# Patient Record
Sex: Male | Born: 1940 | Race: White | Hispanic: No | Marital: Married | Smoking: Former smoker
Health system: Southern US, Community
[De-identification: ages and names within clinical notes are randomized; demographics above are authoritative.]

## PROBLEM LIST (undated history)

## (undated) DIAGNOSIS — R51 Headache: Secondary | ICD-10-CM

## (undated) DIAGNOSIS — K219 Gastro-esophageal reflux disease without esophagitis: Secondary | ICD-10-CM

## (undated) DIAGNOSIS — N183 Chronic kidney disease, stage 3 unspecified: Secondary | ICD-10-CM

## (undated) DIAGNOSIS — I639 Cerebral infarction, unspecified: Secondary | ICD-10-CM

## (undated) DIAGNOSIS — Z789 Other specified health status: Secondary | ICD-10-CM

## (undated) DIAGNOSIS — G8929 Other chronic pain: Secondary | ICD-10-CM

## (undated) DIAGNOSIS — I5032 Chronic diastolic (congestive) heart failure: Secondary | ICD-10-CM

## (undated) DIAGNOSIS — I219 Acute myocardial infarction, unspecified: Secondary | ICD-10-CM

## (undated) DIAGNOSIS — E119 Type 2 diabetes mellitus without complications: Secondary | ICD-10-CM

## (undated) DIAGNOSIS — R519 Headache, unspecified: Secondary | ICD-10-CM

## (undated) DIAGNOSIS — I10 Essential (primary) hypertension: Secondary | ICD-10-CM

## (undated) DIAGNOSIS — C44300 Unspecified malignant neoplasm of skin of unspecified part of face: Secondary | ICD-10-CM

## (undated) DIAGNOSIS — M25519 Pain in unspecified shoulder: Secondary | ICD-10-CM

## (undated) DIAGNOSIS — E7800 Pure hypercholesterolemia, unspecified: Secondary | ICD-10-CM

## (undated) DIAGNOSIS — G4733 Obstructive sleep apnea (adult) (pediatric): Secondary | ICD-10-CM

## (undated) DIAGNOSIS — F419 Anxiety disorder, unspecified: Secondary | ICD-10-CM

## (undated) DIAGNOSIS — E039 Hypothyroidism, unspecified: Secondary | ICD-10-CM

## (undated) DIAGNOSIS — I2510 Atherosclerotic heart disease of native coronary artery without angina pectoris: Secondary | ICD-10-CM

## (undated) DIAGNOSIS — F32A Depression, unspecified: Secondary | ICD-10-CM

## (undated) DIAGNOSIS — I77810 Thoracic aortic ectasia: Secondary | ICD-10-CM

## (undated) DIAGNOSIS — J449 Chronic obstructive pulmonary disease, unspecified: Secondary | ICD-10-CM

## (undated) DIAGNOSIS — R410 Disorientation, unspecified: Secondary | ICD-10-CM

## (undated) DIAGNOSIS — F329 Major depressive disorder, single episode, unspecified: Secondary | ICD-10-CM

## (undated) DIAGNOSIS — I442 Atrioventricular block, complete: Secondary | ICD-10-CM

## (undated) DIAGNOSIS — Z950 Presence of cardiac pacemaker: Secondary | ICD-10-CM

## (undated) DIAGNOSIS — R001 Bradycardia, unspecified: Secondary | ICD-10-CM

## (undated) DIAGNOSIS — M1990 Unspecified osteoarthritis, unspecified site: Secondary | ICD-10-CM

## (undated) DIAGNOSIS — I455 Other specified heart block: Secondary | ICD-10-CM

## (undated) HISTORY — DX: Chronic kidney disease, stage 3 (moderate): N18.3

## (undated) HISTORY — DX: Chronic diastolic (congestive) heart failure: I50.32

## (undated) HISTORY — DX: Thoracic aortic ectasia: I77.810

## (undated) HISTORY — PX: EYE SURGERY: SHX253

## (undated) HISTORY — DX: Chronic kidney disease, stage 3 unspecified: N18.30

## (undated) HISTORY — DX: Other specified health status: Z78.9

## (undated) HISTORY — DX: Other specified heart block: I45.5

## (undated) HISTORY — PX: RETINAL DETACHMENT SURGERY: SHX105

## (undated) HISTORY — PX: INSERT / REPLACE / REMOVE PACEMAKER: SUR710

## (undated) HISTORY — DX: Obstructive sleep apnea (adult) (pediatric): G47.33

## (undated) HISTORY — PX: CORONARY ANGIOPLASTY WITH STENT PLACEMENT: SHX49

## (undated) HISTORY — DX: Bradycardia, unspecified: R00.1

---

## 1999-01-12 HISTORY — PX: CORONARY ANGIOPLASTY WITH STENT PLACEMENT: SHX49

## 2010-01-11 HISTORY — PX: CORONARY ANGIOPLASTY: SHX604

## 2011-09-09 DIAGNOSIS — I214 Non-ST elevation (NSTEMI) myocardial infarction: Secondary | ICD-10-CM | POA: Insufficient documentation

## 2014-01-11 HISTORY — PX: CATARACT EXTRACTION W/ INTRAOCULAR LENS  IMPLANT, BILATERAL: SHX1307

## 2015-02-10 DIAGNOSIS — R9389 Abnormal findings on diagnostic imaging of other specified body structures: Secondary | ICD-10-CM | POA: Insufficient documentation

## 2015-02-12 DIAGNOSIS — I442 Atrioventricular block, complete: Secondary | ICD-10-CM

## 2015-02-12 HISTORY — DX: Atrioventricular block, complete: I44.2

## 2015-02-19 DIAGNOSIS — I5030 Unspecified diastolic (congestive) heart failure: Secondary | ICD-10-CM | POA: Insufficient documentation

## 2015-02-19 DIAGNOSIS — M75101 Unspecified rotator cuff tear or rupture of right shoulder, not specified as traumatic: Secondary | ICD-10-CM | POA: Insufficient documentation

## 2015-02-19 DIAGNOSIS — G4733 Obstructive sleep apnea (adult) (pediatric): Secondary | ICD-10-CM | POA: Insufficient documentation

## 2015-03-03 ENCOUNTER — Encounter (HOSPITAL_COMMUNITY): Payer: Self-pay

## 2015-03-03 ENCOUNTER — Emergency Department (HOSPITAL_COMMUNITY): Payer: Medicare Other

## 2015-03-03 ENCOUNTER — Inpatient Hospital Stay (HOSPITAL_COMMUNITY)
Admission: EM | Admit: 2015-03-03 | Discharge: 2015-03-05 | DRG: 242 | Disposition: A | Discharge status: Home / Self Care | Payer: Medicare Other | Attending: Cardiology | Admitting: Cardiology

## 2015-03-03 DIAGNOSIS — Z95818 Presence of other cardiac implants and grafts: Secondary | ICD-10-CM

## 2015-03-03 DIAGNOSIS — I469 Cardiac arrest, cause unspecified: Secondary | ICD-10-CM | POA: Diagnosis present

## 2015-03-03 DIAGNOSIS — R001 Bradycardia, unspecified: Secondary | ICD-10-CM | POA: Diagnosis present

## 2015-03-03 DIAGNOSIS — I10 Essential (primary) hypertension: Secondary | ICD-10-CM | POA: Diagnosis present

## 2015-03-03 DIAGNOSIS — Z955 Presence of coronary angioplasty implant and graft: Secondary | ICD-10-CM

## 2015-03-03 DIAGNOSIS — I2510 Atherosclerotic heart disease of native coronary artery without angina pectoris: Secondary | ICD-10-CM | POA: Diagnosis present

## 2015-03-03 DIAGNOSIS — G4733 Obstructive sleep apnea (adult) (pediatric): Secondary | ICD-10-CM | POA: Diagnosis present

## 2015-03-03 DIAGNOSIS — R059 Cough, unspecified: Secondary | ICD-10-CM

## 2015-03-03 DIAGNOSIS — I442 Atrioventricular block, complete: Principal | ICD-10-CM | POA: Diagnosis present

## 2015-03-03 DIAGNOSIS — J90 Pleural effusion, not elsewhere classified: Secondary | ICD-10-CM

## 2015-03-03 DIAGNOSIS — K7689 Other specified diseases of liver: Secondary | ICD-10-CM | POA: Diagnosis present

## 2015-03-03 DIAGNOSIS — R55 Syncope and collapse: Secondary | ICD-10-CM | POA: Diagnosis not present

## 2015-03-03 DIAGNOSIS — R05 Cough: Secondary | ICD-10-CM

## 2015-03-03 HISTORY — DX: Essential (primary) hypertension: I10

## 2015-03-03 HISTORY — DX: Acute myocardial infarction, unspecified: I21.9

## 2015-03-03 LAB — CBC WITH DIFFERENTIAL/PLATELET
Basophils Absolute: 0 10*3/uL (ref 0.0–0.1)
Basophils Relative: 0 %
Eosinophils Absolute: 0.1 10*3/uL (ref 0.0–0.7)
Eosinophils Relative: 3 %
HEMATOCRIT: 37 % — AB (ref 39.0–52.0)
HEMOGLOBIN: 12.7 g/dL — AB (ref 13.0–17.0)
LYMPHS ABS: 0.8 10*3/uL (ref 0.7–4.0)
LYMPHS PCT: 21 %
MCH: 31.3 pg (ref 26.0–34.0)
MCHC: 34.3 g/dL (ref 30.0–36.0)
MCV: 91.1 fL (ref 78.0–100.0)
MONOS PCT: 8 %
Monocytes Absolute: 0.3 10*3/uL (ref 0.1–1.0)
NEUTROS ABS: 2.5 10*3/uL (ref 1.7–7.7)
NEUTROS PCT: 68 %
Platelets: 146 10*3/uL — ABNORMAL LOW (ref 150–400)
RBC: 4.06 MIL/uL — ABNORMAL LOW (ref 4.22–5.81)
RDW: 14.1 % (ref 11.5–15.5)
WBC: 3.6 10*3/uL — ABNORMAL LOW (ref 4.0–10.5)

## 2015-03-03 LAB — COMPREHENSIVE METABOLIC PANEL
ALK PHOS: 53 U/L (ref 38–126)
ALT: 43 U/L (ref 17–63)
ANION GAP: 11 (ref 5–15)
AST: 48 U/L — ABNORMAL HIGH (ref 15–41)
Albumin: 3.3 g/dL — ABNORMAL LOW (ref 3.5–5.0)
BILIRUBIN TOTAL: 0.5 mg/dL (ref 0.3–1.2)
BUN: 12 mg/dL (ref 6–20)
CALCIUM: 8.7 mg/dL — AB (ref 8.9–10.3)
CO2: 22 mmol/L (ref 22–32)
CREATININE: 1.46 mg/dL — AB (ref 0.61–1.24)
Chloride: 105 mmol/L (ref 101–111)
GFR calc non Af Amer: 45 mL/min — ABNORMAL LOW (ref >60)
GFR, EST AFRICAN AMERICAN: 52 mL/min — AB (ref >60)
GLUCOSE: 232 mg/dL — AB (ref 65–99)
Potassium: 4 mmol/L (ref 3.5–5.1)
Sodium: 138 mmol/L (ref 135–145)
TOTAL PROTEIN: 6.9 g/dL (ref 6.5–8.1)

## 2015-03-03 LAB — MRSA PCR SCREENING: MRSA BY PCR: NEGATIVE

## 2015-03-03 LAB — I-STAT CG4 LACTIC ACID, ED: Lactic Acid, Venous: 2.59 mmol/L (ref 0.5–2.0)

## 2015-03-03 LAB — I-STAT TROPONIN, ED: Troponin i, poc: 0.01 ng/mL (ref 0.00–0.08)

## 2015-03-03 LAB — BRAIN NATRIURETIC PEPTIDE: B NATRIURETIC PEPTIDE 5: 17.6 pg/mL (ref 0.0–100.0)

## 2015-03-03 LAB — CBG MONITORING, ED: Glucose-Capillary: 182 mg/dL — ABNORMAL HIGH (ref 65–99)

## 2015-03-03 MED ORDER — SODIUM CHLORIDE 0.9 % IV SOLN
INTRAVENOUS | Status: DC
  Administered 2015-03-04: 06:00:00 via INTRAVENOUS

## 2015-03-03 MED ORDER — CEFAZOLIN SODIUM-DEXTROSE 2-3 GM-% IV SOLR
2.0000 g | INTRAVENOUS | Status: DC
  Filled 2015-03-03: qty 50

## 2015-03-03 MED ORDER — SODIUM CHLORIDE 0.9% FLUSH
3.0000 mL | Freq: Two times a day (BID) | INTRAVENOUS | Status: DC
  Administered 2015-03-04: 3 mL via INTRAVENOUS

## 2015-03-03 MED ORDER — SODIUM CHLORIDE 0.9 % IV SOLN: 250.0000 mL | INTRAVENOUS | Status: DC | PRN

## 2015-03-03 MED ORDER — VANCOMYCIN HCL 10 G IV SOLR
2000.0000 mg | Freq: Once | INTRAVENOUS | Status: AC
  Administered 2015-03-03: 2000 mg via INTRAVENOUS
  Filled 2015-03-03: qty 2000

## 2015-03-03 MED ORDER — TORSEMIDE 20 MG PO TABS
20.0000 mg | ORAL_TABLET | Freq: Every day | ORAL | Status: DC
  Administered 2015-03-03 – 2015-03-04 (×2): 20 mg via ORAL
  Filled 2015-03-03 (×2): qty 1

## 2015-03-03 MED ORDER — CHLORHEXIDINE GLUCONATE 4 % EX LIQD
60.0000 mL | Freq: Once | CUTANEOUS | Status: AC
  Administered 2015-03-03: 4 via TOPICAL
  Filled 2015-03-03: qty 60

## 2015-03-03 MED ORDER — VANCOMYCIN HCL 10 G IV SOLR
1250.0000 mg | INTRAVENOUS | Status: DC
  Administered 2015-03-04: 1250 mg via INTRAVENOUS
  Filled 2015-03-03: qty 1250

## 2015-03-03 MED ORDER — SODIUM CHLORIDE 0.9% FLUSH: 3.0000 mL | INTRAVENOUS | Status: DC | PRN

## 2015-03-03 MED ORDER — ATROPINE SULFATE 0.1 MG/ML IJ SOLN
INTRAMUSCULAR | Status: AC
  Administered 2015-03-03: 0.5 mg
  Filled 2015-03-03: qty 20

## 2015-03-03 MED ORDER — ACETAMINOPHEN 325 MG PO TABS: 650.0000 mg | ORAL_TABLET | ORAL | Status: DC | PRN

## 2015-03-03 MED ORDER — RAMIPRIL 2.5 MG PO CAPS
2.5000 mg | ORAL_CAPSULE | Freq: Every day | ORAL | Status: DC
  Administered 2015-03-03 – 2015-03-04 (×2): 2.5 mg via ORAL
  Filled 2015-03-03 (×2): qty 1

## 2015-03-03 MED ORDER — SODIUM CHLORIDE 0.9 % IR SOLN
80.0000 mg | Status: AC
  Administered 2015-03-04: 80 mg

## 2015-03-03 MED ORDER — ONDANSETRON HCL 4 MG/2ML IJ SOLN: 4.0000 mg | Freq: Four times a day (QID) | INTRAMUSCULAR | Status: DC | PRN

## 2015-03-03 MED ORDER — DEXTROSE 5 % IV SOLN
2.0000 g | Freq: Once | INTRAVENOUS | Status: AC
  Administered 2015-03-03: 2 g via INTRAVENOUS
  Filled 2015-03-03: qty 2

## 2015-03-03 MED ORDER — NITROGLYCERIN 0.4 MG SL SUBL: 0.4000 mg | SUBLINGUAL_TABLET | SUBLINGUAL | Status: DC | PRN

## 2015-03-03 MED ORDER — HYDROCODONE-HOMATROPINE 5-1.5 MG/5ML PO SYRP
5.0000 mL | ORAL_SOLUTION | Freq: Four times a day (QID) | ORAL | Status: DC | PRN
  Administered 2015-03-03: 5 mL via ORAL
  Filled 2015-03-03: qty 5

## 2015-03-03 MED ORDER — CHLORHEXIDINE GLUCONATE 4 % EX LIQD
60.0000 mL | Freq: Once | CUTANEOUS | Status: AC
  Administered 2015-03-04: 4 via TOPICAL
  Filled 2015-03-03: qty 60

## 2015-03-03 NOTE — ED Notes (Signed)
Cardiology at bedside.

## 2015-03-03 NOTE — H&P (Signed)
H&P    Patient ID: [name] MRN: [id number], DOB/AGE: 1940/08/18 75 y.o.  Admit date: 03/03/2015 Date of Consult: 03/03/2015   Primary Physician: No primary care provider on file. Primary Cardiologist:   Reason for Admission: syncope, witnessed asystole  HPI: [name] is a 75 y.o. male with hx of CAD, remote stents x2 about 20 years ago, reportedly had cath about 4 years ago with what sounds PTCA but not another stent.  This was his last cath.  He has HTN, OSA.was noted at home to have a syncopal event.  His wife was in the room, said he dropped his glasses and she picked them up when she went to hand them to him, he was leaned back in the chair and unresponsive.  She states he did not fall out of the chair, did not slump over or have any shaking type movements until he started to "come to" and seemed to be gasping for air, just was with his head back sitting in the chair, she shook him, called his name, said he was clammy, pale, was unable to wake him.  Finally he woke she called 911, he had taken cough syrup the evening prior for a cough that contained hydrocodone, noting a cough in the last few nights.  He has been more tired then usual the last few weeks, his wife reports that she has always been told he has a slow heart rate.  The patient states he feels tired, but not having any kind of CP, palpitations,  He is unaware of the event here or at home.  He was brought to the ER via EMS< noted to have HR's sipping into the 30's by staff, and then a prolonged asystolic associated with LOC event receiving brief CPR with regain of SR.  He has been somnelent here, EMS reoprted fixed pupils, not observed by ER staff, reporteldy has had a number of surguries on his L eye and not felt secondary to an acute neuro event  He is awake and conversing easily at this time.  He was recently treated at [place] for a pneumonia in January, undergoing an echo and stress test while there 01/23/15:  Lexiscan stress test with no ischemia or infarct, normal LV wall motion, EF 63%, low risk findings 01/24/15: Echo with EF 55-60% Notes mentions HR 40's at times.   Past Medical History  Diagnosis Date  . Hypertension   . CHF (congestive heart failure) ([place])   . Heart attack [facility]) 'many years ago'     Surgical History:  Past Surgical History  Procedure Laterality Date  . Cardiac catheterization       Allergies: Allergies not on file  Social History   Social History  . Marital Status: Married    Spouse Name: N/A  . Number of Children: N/A  . Years of Education: N/A   Occupational History  . Not on file.   Social History Main Topics  . Smoking status: Former Research scientist (life sciences)  . Smokeless tobacco: Not on file  . Alcohol Use: No  . Drug Use: No  . Sexual Activity: Not on file   Other Topics Concern  . Not on file   Social History Narrative  . No narrative on file     Family History  Problem Relation Age of Onset  . Diabetes Sister   . Diabetes Brother      Review of Systems: All other systems reviewed and are otherwise negative except as noted above.  Physical  Exam: Filed Vitals:   03/03/15 1000 03/03/15 1030 03/03/15 1100 03/03/15 1130  BP: 130/77 126/84 127/110 146/92  Pulse: 66 58 55 56  Temp:      TempSrc:      Resp: 18 20 17 15   Height:      Weight:      SpO2: 98% 100% 99% 99%    GEN- The patient is obese appearing, alert and oriented x 3 today, sleeping but arouses.   HEENT: normocephalic, atraumatic; sclera clear, conjunctiva pink; hearing intact; oropharynx clear; neck supple Lungs- Clear to ausculation bilaterally, normal work of breathing.  Right base with rhonchi Heart- Regular rate and rhythm, no murmurs, rubs or gallops  GI- soft, non-tender, non-distended, bowel sounds present Extremities- no clubbing, cyanosis, or edema; DP/PT/radial pulses 2+ bilaterally MS- no significant deformity or atrophy Skin- warm and dry, no rash or lesion Psych-  euthymic mood, full affect Neuro- no gross deficits observed  Labs:   Lab Results  Component Value Date   WBC 3.6* 03/03/2015   HGB 12.7* 03/03/2015   HCT 37.0* 03/03/2015   MCV 91.1 03/03/2015   PLT 146* 03/03/2015     Recent Labs Lab 03/03/15 0816  NA 138  K 4.0  CL 105  CO2 22  BUN 12  CREATININE 1.46*  CALCIUM 8.7*  PROT 6.9  BILITOT 0.5  ALKPHOS 53  ALT 43  AST 48*  GLUCOSE 232*      Radiology/Studies: Dg Chest 2 View 03/03/2015  CLINICAL DATA:  75 year old male with cough EXAM: CHEST  2 VIEW COMPARISON:  None. FINDINGS: Two views of the chest demonstrate a focal area of increased density at the right lung base with silhouetting of the right hemidiaphragm compatible with atelectasis versus pneumonia. A small right pleural effusion may be present. Left lung base subsegmental linear atelectatic changes noted. There is no pneumothorax. The right cardiac border is silhouetted. There is degenerative changes of the spine and left shoulder. No acute fracture. IMPRESSION: Right lung base opacity compatible with atelectasis/ pneumonia. Electronically Signed   By: Anner Crete M.D.   On: 03/03/2015 08:57    EKG:SR, 1st degree AVblock, no ST changes, no historical EKGs available, he had SB   TELEMETRY: Currently SR 60's, he had SB   Assessment and Plan:  1. Syncope with documented complete heart block  Not on any AVN blocking agents EF normal 01/2015 with myoview and no ischemia  Continue pacer pads Admit to stepdown Plan dual chamber pacemaker as schedule allows. NPO after midnight tonight.   2.  ?Pneumonia Ongoing issue Has been treated by PCP as outpatient Will ask IM to evaluate    Signed, Chanetta Marshall, NP 03/03/2015 12:23 PM  EP Attending  Patient seen and examined. Agree with the findings as noted by Chanetta Marshall, NP-C. 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  He is not on any AV or sinus nodal blocking drugs. Exam reveals a RRR, scattered rales/rhonchi on the right, no edema and a non-focal neuro exam. ECG with sinus bradycardia. A/P 1. 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 HTN 3. Remote pneumonia Rec: will observe on tele, and will likely place PPM tomorrow. Evidence of an active infection would prevent this from occuring.   Mikle Bosworth.D.

## 2015-03-03 NOTE — ED Provider Notes (Signed)
Medical screening examination/treatment/procedure(s) were conducted as a shared visit with non-physician practitioner(s) and myself.  I personally evaluated the patient during the encounter.   EKG Interpretation None     Patient here after having syncopal event at home. While in the department he became bradycardic and less responsive. His wife says she's had issues with being sleepy over a month and a half thought to be related to his diuretic use. Heart rate was in the 40s. Is given atropine 0.5 mg IV push. Heart rate improved to 60s. Emergent cardiac consult obtained and will be admitted per cards  Lacretia Leigh, MD 03/03/15 [phone]

## 2015-03-03 NOTE — ED Notes (Addendum)
RN at bedside when pt. Went pulseless for 20 seconds. RN started chest compressions at that time for another 20 seconds. Pt. Pulses returned and regained consciousness during compressions. 2nd IV started, fluids hooked up, and EDP notfied. EDP verbally ordered 0.5mg  atropine. Pt. Stable at this time.

## 2015-03-03 NOTE — Progress Notes (Signed)
Pt had 8 beat run of Vtach while laying in bed was asymptomatic denied SOB and or chest pain, vital signs stable will continue to monitor.

## 2015-03-03 NOTE — Progress Notes (Signed)
Pharmacy Antibiotic Note  Ausitn Hendrix is a 75 y.o. male admitted on 03/03/2015 with pneumonia.  Pharmacy has been consulted for vancomycin dosing.  Plan: - Vancomycin 2gm IV x 1 then 1250mg  IV Q24H - F/u renal fxn, C&S, clinical status and trough at SS - Continue cefepime or other gram negative coverage?  Height: 6' (182.9 cm) Weight: 230 lb (104.327 kg) IBW/kg (Calculated) : 77.6  Temp (24hrs), Avg:97.7 F (36.5 C), Min:97.7 F (36.5 C), Max:97.7 F (36.5 C)   Recent Labs Lab 03/03/15 0816  WBC 3.6*  CREATININE 1.46*    Estimated Creatinine Clearance: 53.8 mL/min (by C-G formula based on Cr of 1.46).    Allergies not on file  Antimicrobials this admission: Vanc 2/20>> Cefepime x 1 2/20  Dose adjustments this admission: N/A  Microbiology results: Pending  Thank you for allowing pharmacy to be a part of this patient's care.  [name], [name] 03/03/2015 9:15 AM

## 2015-03-03 NOTE — ED Provider Notes (Signed)
CSN: [id number]     Arrival date & time 03/03/15  0740 History   First MD Initiated Contact with Patient 03/03/15 [phone]     Chief Complaint  Patient presents with  . Loss of Consciousness     (Consider location/radiation/quality/duration/timing/severity/associated sxs/prior Treatment) HPI Comments: Patient with a history of STEMI, CAD s/p PCI, CHF, and HTN brought in today by EMS due to a syncopal episode that occurred this morning.  Patient's wife reports around 6:30 AM this morning he had a syncopal episode while sitting in his chair.  She estimates that it lasted around 3-5 minutes and then resolved.  He did not fall during this episode or hit his head.  Wife reports that he appeared to be breathing heavy during this episode.  Patient denies any CP or SOB associated with this episode.  Review of the chart shows that he was admitted at [facility] on 01/25/15 for CP.  At that time he had a abnormal Chest CT , which was concerning for Pneumonia.  He had a Myoview Perfusion test done during that admission, which was negative.  It was noted in the chart that his heart rate ranged from 40-92 BPM during that admission.  He has had a cough for the past week and did take Hydrocodone liquid cough syrup last evening around 10:30 PM.  He has not taken any cough medicine since that time.  Wife reports that he has been very tired and sleeping all the time for the past 1.5 months, which she thought was secondary to his diuretic use.  No headache, dizziness, vision changes, focal weakness, numbness, tingling, fever, chills, nausea, or vomiting.    Patient is a 75 y.o. male presenting with syncope. The history is provided by the patient and the spouse.  Loss of Consciousness   No past medical history on file. No past surgical history on file. No family history on file. Social History  Substance Use Topics  . Smoking status: Not on file  . Smokeless tobacco: Not on file  . Alcohol Use: Not on file     Review of Systems  Cardiovascular: Positive for syncope.  All other systems reviewed and are negative.     Allergies  Review of patient's allergies indicates not on file.  Home Medications   Prior to Admission medications   Not on File   BP 133/87 mmHg  Pulse 59  Temp(Src) 97.7 F (36.5 C) (Oral)  Resp 21  Ht 6' (1.829 m)  Wt 104.327 kg  BMI 31.19 kg/m2  SpO2 97% Physical Exam  Constitutional: He is oriented to person, place, and time. He appears well-developed and well-nourished.  HENT:  Head: Normocephalic and atraumatic.  Mouth/Throat: Oropharynx is clear and moist.  Eyes: EOM are normal.  Neck: Normal range of motion. Neck supple.  Cardiovascular: Regular rhythm and normal heart sounds.  Bradycardia present.   Pulmonary/Chest: Effort normal and breath sounds normal.  Musculoskeletal: Normal range of motion.  2+ bilateral pitting edema bilaterally  Neurological: He is alert and oriented to person, place, and time. He has normal strength. No cranial nerve deficit or sensory deficit.  Left pupil non reactive, history of multiple eye surgeries Right pupil reactive  Skin: Skin is warm and dry.  Psychiatric: He has a normal mood and affect.  Nursing note and vitals reviewed.   ED Course  Procedures (including critical care time) Labs Review Labs Reviewed - No data to display  Imaging Review Dg Chest 2 View  03/03/2015  CLINICAL DATA:  75 year old male with cough EXAM: CHEST  2 VIEW COMPARISON:  None. FINDINGS: Two views of the chest demonstrate a focal area of increased density at the right lung base with silhouetting of the right hemidiaphragm compatible with atelectasis versus pneumonia. A small right pleural effusion may be present. Left lung base subsegmental linear atelectatic changes noted. There is no pneumothorax. The right cardiac border is silhouetted. There is degenerative changes of the spine and left shoulder. No acute fracture. IMPRESSION: Right  lung base opacity compatible with atelectasis/ pneumonia. Electronically Signed   By: Anner Crete M.D.   On: 03/03/2015 08:57   I have personally reviewed and evaluated these images and lab results as part of my medical decision-making.   EKG Interpretation   Date/Time:  Monday March 03 2015 09:23:41 EST Ventricular Rate:  69 PR Interval:  217 QRS Duration: 99 QT Interval:  450 QTC Calculation: 482 R Axis:   0 Text Interpretation:  Sinus rhythm Borderline prolonged PR interval  Indeterminate axis Borderline prolonged QT interval No significant change  since last tracing Confirmed by Zenia Resides  MD, ANTHONY ([id number]) on 03/03/2015  11:02:13 AM     CRITICAL CARE Performed by: Hyman Bible   Total critical care time: 30 minutes  Critical care time was exclusive of separately billable procedures and treating other patients.  Critical care was necessary to treat or prevent imminent or life-threatening deterioration.  Critical care was time spent personally by me on the following activities: development of treatment plan with patient and/or surrogate as well as nursing, discussions with consultants, evaluation of patient's response to treatment, examination of patient, obtaining history from patient or surrogate, ordering and performing treatments and interventions, ordering and review of laboratory studies, ordering and review of radiographic studies, pulse oximetry and re-evaluation of patient's condition.  MDM   Final diagnoses:  None  Patient presents today after a syncopal episode that occurred this morning.  No focal neurological deficits.  He denies CP or SOB.  No ischemic changes on EKG, but patient found to be bradycardic.  Troponin negative.  CXR showing possible Pneumonia/Atelectasis.  During ED course patient had an episode of asystole.  RN performed a couple of compressions and patient went back into sinus bradycardia.  He was then given IV Atropine and HR went up into  the 80's.  Cardiology was consulted and admitted the patient.      Hyman Bible, PA-C 03/03/15 2204

## 2015-03-03 NOTE — ED Notes (Signed)
Pt. Coming from home via [facility] after a syncopal episode this morning around 0630. Upon EMS arrival patient was pale and diaphoretic. EMS report pupils were pinpoint and not reactive to light. Pt. C/o being sleepy. Pt. Asleep on arrival to ED. Pt. Wife reports he took cough syrup with hydrocodone last night around 1000 and only took the recommended 64mL dose. Pt. Hx of MI with 2 stents. Pt. Also hospitalized in mid January, but wife cannot recall what for.

## 2015-03-03 NOTE — ED Notes (Signed)
EDP at bedside  

## 2015-03-03 NOTE — ED Notes (Signed)
Delay in antibiotics-no IV poles available. IV poles ordered at this time.

## 2015-03-04 ENCOUNTER — Inpatient Hospital Stay (HOSPITAL_COMMUNITY): Payer: Medicare Other

## 2015-03-04 ENCOUNTER — Encounter (HOSPITAL_COMMUNITY)
Admission: EM | Disposition: A | Discharge status: Home / Self Care | Payer: Self-pay | Source: Home / Self Care | Attending: Cardiology

## 2015-03-04 ENCOUNTER — Encounter (HOSPITAL_COMMUNITY): Payer: Self-pay | Admitting: Radiology

## 2015-03-04 DIAGNOSIS — R001 Bradycardia, unspecified: Secondary | ICD-10-CM | POA: Diagnosis present

## 2015-03-04 DIAGNOSIS — I495 Sick sinus syndrome: Secondary | ICD-10-CM

## 2015-03-04 DIAGNOSIS — I2510 Atherosclerotic heart disease of native coronary artery without angina pectoris: Secondary | ICD-10-CM | POA: Diagnosis present

## 2015-03-04 DIAGNOSIS — K7689 Other specified diseases of liver: Secondary | ICD-10-CM | POA: Diagnosis present

## 2015-03-04 DIAGNOSIS — I469 Cardiac arrest, cause unspecified: Secondary | ICD-10-CM | POA: Diagnosis present

## 2015-03-04 DIAGNOSIS — Z955 Presence of coronary angioplasty implant and graft: Secondary | ICD-10-CM | POA: Diagnosis not present

## 2015-03-04 DIAGNOSIS — I10 Essential (primary) hypertension: Secondary | ICD-10-CM | POA: Diagnosis present

## 2015-03-04 DIAGNOSIS — I455 Other specified heart block: Secondary | ICD-10-CM

## 2015-03-04 DIAGNOSIS — G4733 Obstructive sleep apnea (adult) (pediatric): Secondary | ICD-10-CM | POA: Diagnosis present

## 2015-03-04 DIAGNOSIS — R55 Syncope and collapse: Secondary | ICD-10-CM | POA: Diagnosis present

## 2015-03-04 DIAGNOSIS — I442 Atrioventricular block, complete: Secondary | ICD-10-CM | POA: Diagnosis present

## 2015-03-04 HISTORY — PX: EP IMPLANTABLE DEVICE: SHX172B

## 2015-03-04 HISTORY — DX: Other specified heart block: I45.5

## 2015-03-04 LAB — BASIC METABOLIC PANEL
ANION GAP: 9 (ref 5–15)
BUN: 14 mg/dL (ref 6–20)
CALCIUM: 8.9 mg/dL (ref 8.9–10.3)
CO2: 29 mmol/L (ref 22–32)
Chloride: 104 mmol/L (ref 101–111)
Creatinine, Ser: 1.42 mg/dL — ABNORMAL HIGH (ref 0.61–1.24)
GFR, EST AFRICAN AMERICAN: 54 mL/min — AB (ref >60)
GFR, EST NON AFRICAN AMERICAN: 46 mL/min — AB (ref >60)
Glucose, Bld: 117 mg/dL — ABNORMAL HIGH (ref 65–99)
POTASSIUM: 3.7 mmol/L (ref 3.5–5.1)
Sodium: 142 mmol/L (ref 135–145)

## 2015-03-04 LAB — CBC
HEMATOCRIT: 38.7 % — AB (ref 39.0–52.0)
HEMOGLOBIN: 13.2 g/dL (ref 13.0–17.0)
MCH: 31.5 pg (ref 26.0–34.0)
MCHC: 34.1 g/dL (ref 30.0–36.0)
MCV: 92.4 fL (ref 78.0–100.0)
Platelets: 162 10*3/uL (ref 150–400)
RBC: 4.19 MIL/uL — AB (ref 4.22–5.81)
RDW: 14.4 % (ref 11.5–15.5)
WBC: 4.8 10*3/uL (ref 4.0–10.5)

## 2015-03-04 SURGERY — PACEMAKER IMPLANT
Anesthesia: LOCAL

## 2015-03-04 MED ORDER — ACETAMINOPHEN 325 MG PO TABS
325.0000 mg | ORAL_TABLET | ORAL | Status: DC | PRN
  Administered 2015-03-04: 650 mg via ORAL
  Filled 2015-03-04: qty 2

## 2015-03-04 MED ORDER — MIDAZOLAM HCL 5 MG/5ML IJ SOLN
INTRAMUSCULAR | Status: DC | PRN
  Administered 2015-03-04 (×2): 1 mg via INTRAVENOUS

## 2015-03-04 MED ORDER — HEPARIN (PORCINE) IN NACL 2-0.9 UNIT/ML-% IJ SOLN
INTRAMUSCULAR | Status: AC
  Filled 2015-03-04: qty 500

## 2015-03-04 MED ORDER — FENTANYL CITRATE (PF) 100 MCG/2ML IJ SOLN
INTRAMUSCULAR | Status: AC
  Filled 2015-03-04: qty 2

## 2015-03-04 MED ORDER — ONDANSETRON HCL 4 MG/2ML IJ SOLN: 4.0000 mg | Freq: Four times a day (QID) | INTRAMUSCULAR | Status: DC | PRN

## 2015-03-04 MED ORDER — LIDOCAINE HCL (PF) 1 % IJ SOLN
INTRAMUSCULAR | Status: AC
  Filled 2015-03-04: qty 30

## 2015-03-04 MED ORDER — HEPARIN SODIUM (PORCINE) 1000 UNIT/ML IJ SOLN
INTRAMUSCULAR | Status: DC | PRN
  Administered 2015-03-04: 1000 [IU] via INTRAVENOUS

## 2015-03-04 MED ORDER — FENTANYL CITRATE (PF) 100 MCG/2ML IJ SOLN
INTRAMUSCULAR | Status: DC | PRN
  Administered 2015-03-04 (×2): 25 ug via INTRAVENOUS

## 2015-03-04 MED ORDER — CEFAZOLIN SODIUM 1-5 GM-% IV SOLN
1.0000 g | Freq: Four times a day (QID) | INTRAVENOUS | Status: AC
  Administered 2015-03-04 – 2015-03-05 (×3): 1 g via INTRAVENOUS
  Filled 2015-03-04 (×3): qty 50

## 2015-03-04 MED ORDER — LIDOCAINE HCL (PF) 1 % IJ SOLN
INTRAMUSCULAR | Status: DC | PRN
  Administered 2015-03-04: 50 mL

## 2015-03-04 MED ORDER — MIDAZOLAM HCL 5 MG/5ML IJ SOLN
INTRAMUSCULAR | Status: AC
  Filled 2015-03-04: qty 5

## 2015-03-04 MED ORDER — CEFAZOLIN SODIUM-DEXTROSE 2-3 GM-% IV SOLR
INTRAVENOUS | Status: AC
  Filled 2015-03-04: qty 50

## 2015-03-04 MED ORDER — IOHEXOL 300 MG/ML  SOLN
75.0000 mL | Freq: Once | INTRAMUSCULAR | Status: AC | PRN
  Administered 2015-03-04: 11:00:00 via INTRAVENOUS

## 2015-03-04 MED ORDER — SODIUM CHLORIDE 0.9 % IR SOLN
Status: AC
  Filled 2015-03-04: qty 2

## 2015-03-04 MED ORDER — CEFAZOLIN SODIUM-DEXTROSE 2-3 GM-% IV SOLR
INTRAVENOUS | Status: DC | PRN
  Administered 2015-03-04: 2 g via INTRAVENOUS

## 2015-03-04 MED ORDER — LIDOCAINE HCL (PF) 1 % IJ SOLN
INTRAMUSCULAR | Status: AC
  Filled 2015-03-04: qty 60

## 2015-03-04 SURGICAL SUPPLY — 7 items
CABLE SURGICAL S-101-97-12 (CABLE) ×2 IMPLANT
LEAD TENDRIL SDX 2088TC-52CM (Lead) ×2 IMPLANT
LEAD TENDRIL SDX 2088TC-58CM (Lead) ×2 IMPLANT
PAD DEFIB LIFELINK (PAD) ×2 IMPLANT
PPM ASSURITY DR PM2240 (Pacemaker) ×2 IMPLANT
SHEATH CLASSIC 7F (SHEATH) ×4 IMPLANT
TRAY PACEMAKER INSERTION (PACKS) ×2 IMPLANT

## 2015-03-04 NOTE — Progress Notes (Signed)
Orthopedic Tech Progress Note Patient Details:  [name] 09-Nov-1940 [id number]  Ortho Devices Type of Ortho Device: Arm sling Ortho Device/Splint Interventions: Application   [place] Pink 03/04/2015, 3:58 PM

## 2015-03-04 NOTE — Progress Notes (Signed)
Educated patient and wife on importance of keeping Arm sling in place.  Will continue to assess.  Arm sling reapplied

## 2015-03-04 NOTE — Discharge Instructions (Signed)
° ° °  Supplemental Discharge Instructions for  Pacemaker/Defibrillator Patients  Activity No heavy lifting or vigorous activity with your left/right arm for 6 to 8 weeks.  Do not raise your left/right arm above your head for one week.  Gradually raise your affected arm as drawn below.           __         03/08/15                03/09/15                      03/10/15                  03/11/15  NO DRIVING until after wound check appointment   WOUND CARE - Keep the wound area clean and dry.  Do not get this area wet for one week. No showers for one week; you may shower on  03/11/15   . - The tape/steri-strips on your wound will fall off; do not pull them off.  No bandage is needed on the site.  DO  NOT apply any creams, oils, or ointments to the wound area. - If you notice any drainage or discharge from the wound, any swelling or bruising at the site, or you develop a fever > 101? F after you are discharged home, call the office at once.  Special Instructions - You are still able to use cellular telephones; use the ear opposite the side where you have your pacemaker/defibrillator.  Avoid carrying your cellular phone near your device. - When traveling through airports, show security personnel your identification card to avoid being screened in the metal detectors.  Ask the security personnel to use the hand wand. - Avoid arc welding equipment, MRI testing (magnetic resonance imaging), TENS units (transcutaneous nerve stimulators).  Call the office for questions about other devices. - Avoid electrical appliances that are in poor condition or are not properly grounded. - Microwave ovens are safe to be near or to operate.

## 2015-03-04 NOTE — Discharge Summary (Signed)
ELECTROPHYSIOLOGY PROCEDURE DISCHARGE SUMMARY    Patient ID: [name],  MRN: [id number], DOB/AGE: 15-Jul-1940 75 y.o.  Admit date: 03/03/2015 Discharge date: 03/05/2015  Primary Care Physician: No primary care provider on file. Electrophysiologist: Lennie Muckle  Primary Discharge Diagnosis:  Symptomatic bradycardia status post pacemaker implantation this admission  Secondary Discharge Diagnosis:  1.  CAD 2.  Hypertension 3.  OSA  Allergies  Allergen Reactions  . Statins     Whole body ache/pain  . Novocain [Procaine]     Hallucinations     Procedures This Admission:  1.  Implantation of a STJ dual chamber PPM on 03/04/15 by Dr Curt Bears.  The patient received a STJ model number Assurity PPM with model number 2088 right atrial lead and 2088 right ventricular lead. There were no immediate post procedure complications. 2.  CXR on 03-05-15 demonstrated no pneumothorax status post device implantation.  3.  Chest CT on 03/04/15 demonstrated minimal right basilar atelectasis as well as a tiny amount of loculated fluid at the junction of major and minor fissure on the right.  Likely liver cysts with ultrasound recommended as an outpatient.   Brief HPI/Hospital Course:  [name] is a 75 y.o. male with a past medical history of CAD with recent normal myoview and echo, hypertension, and OSA who presented on the day of admission with syncope.  The patient has had documented symptomatic bradycardia and sinus arrest without reversible causes identified.  Risks, benefits, and alternatives to PPM implantation were reviewed with the patient who wished to proceed.  The patient underwent implantation of a STJ dual chamber pacemaker with details as outlined above.  He  was monitored on telemetry overnight which demonstrated atrial pacing with intrinsic ventricular conduction.  Left chest was without hematoma or ecchymosis.  The device was interrogated and found to be functioning  normally.  CXR was obtained and demonstrated no pneumothorax status post device implantation.  Wound care, arm mobility, and restrictions were reviewed with the patient.  The patient was examined by Dr Curt Bears and considered stable for discharge to home.   He has recently been treated with pneumonia and with right consolidation on CXR. Chest CT was obtained with no acute findings. Outpatient follow up of possible liver cysts is recommended.    Physical Exam: Filed Vitals:   03/04/15 1646 03/04/15 1737 03/04/15 1954 03/05/15 0353  BP:  145/97 141/95 135/90  Pulse:  79 96 84  Temp: 98.4 F (36.9 C)  98.3 F (36.8 C) 98.1 F (36.7 C)  TempSrc: Oral  Oral Oral  Resp:   18 16  Height:      Weight:    224 lb 3.2 oz (101.696 kg)  SpO2:  95% 95% 94%    Labs:   Lab Results  Component Value Date   WBC 4.8 03/04/2015   HGB 13.2 03/04/2015   HCT 38.7* 03/04/2015   MCV 92.4 03/04/2015   PLT 162 03/04/2015     Recent Labs Lab 03/03/15 0816 03/04/15 0549  NA 138 142  K 4.0 3.7  CL 105 104  CO2 22 29  BUN 12 14  CREATININE 1.46* 1.42*  CALCIUM 8.7* 8.9  PROT 6.9  --   BILITOT 0.5  --   ALKPHOS 53  --   ALT 43  --   AST 48*  --   GLUCOSE 232* 117*    Discharge Medications:    Medication List    TAKE these medications  co-enzyme Q-10 30 MG capsule  Take 30 mg by mouth daily.     HYDROcodone-homatropine 5-1.5 MG/5ML syrup  Commonly known as:  HYCODAN  Take 5 mLs by mouth every 6 (six) hours as needed for cough.     metoprolol succinate 50 MG 24 hr tablet  Commonly known as:  TOPROL XL  Take 1 tablet (50 mg total) by mouth daily. Take with or immediately following a meal.     multivitamin with minerals Tabs tablet  Take 1 tablet by mouth daily.     nitroGLYCERIN 0.4 MG SL tablet  Commonly known as:  NITROSTAT  Place 0.4 mg under the tongue daily as needed. Chest pain     Omega-3 1000 MG Caps  Take 1 g by mouth daily.     ramipril 2.5 MG capsule    Commonly known as:  ALTACE  Take 2.5 mg by mouth daily.     ranitidine 150 MG capsule  Commonly known as:  ZANTAC  Take 150 mg by mouth daily.     torsemide 20 MG tablet  Commonly known as:  DEMADEX  Take 20 mg by mouth daily.     vitamin B-12 1000 MCG tablet  Commonly known as:  CYANOCOBALAMIN  Take 1,000 mcg by mouth daily.        Disposition:  Discharge Instructions    Diet - low sodium heart healthy    Complete by:  As directed      Increase activity slowly    Complete by:  As directed           Follow-up Information    Follow up with [facility] On 03/13/2015.   Specialty:  Cardiology   Why:  at 2:30PM for wound check    Contact information:   [address] [place] [phone]      Please follow up.   Why:  follow up with primary care physician about need for possible ultrasound on liver cysts      Duration of Discharge Encounter: Greater than 30 minutes including physician time.  Signed, Chanetta Marshall, NP 03/05/2015 8:22 AM   I have seen and examined this patient with [employer].  Agree with above, note added to reflect my findings.  On exam, regular rhythm, no murmurs, lungs clear, incision C/D/I.  Presented with intermittent sinus arrest and syncope.  Had dual chamber pacemaker placed without complication.  CXR without evidence of pneumothorax.  Plan for discharge today with follow up in device clinic.  Did have narrow complex tachycardia overnight, Airyonna Franklyn plan on metoprolol at discharge.    Jamilex Bohnsack M. Aarianna Hoadley MD 03/05/2015 11:30 AM

## 2015-03-04 NOTE — Progress Notes (Signed)
SUBJECTIVE: The patient is doing well today.  At this time, he denies chest pain, shortness of breath, or any new concerns.  CURRENT MEDICATIONS: .  ceFAZolin (ANCEF) IV  2 g Intravenous On Call  . gentamicin irrigation  80 mg Irrigation On Call  . ramipril  2.5 mg Oral Daily  . sodium chloride flush  3 mL Intravenous Q12H  . torsemide  20 mg Oral Daily   . sodium chloride 50 mL/hr at 03/04/15 0559    OBJECTIVE: Physical Exam: Filed Vitals:   03/04/15 0400 03/04/15 0408 03/04/15 0537 03/04/15 1145  BP:  115/83  155/91  Pulse: 92 77    Temp:  98 F (36.7 C)    TempSrc:  Oral    Resp: 29 21    Height:      Weight:   228 lb 4.8 oz (103.556 kg)   SpO2:  96%      Intake/Output Summary (Last 24 hours) at 03/04/15 1233 Last data filed at 03/04/15 0900  Gross per 24 hour  Intake    480 ml  Output   1750 ml  Net  -1270 ml    Telemetry reveals sinus rhythm and sinus bradycardia, occasional junctional rhythm  GEN- The patient is obese appearing, alert and oriented x 3 today.   Head- normocephalic, atraumatic Eyes-  Sclera clear, conjunctiva pink Ears- hearing intact Oropharynx- clear Neck- supple  Lungs- Clear to ausculation bilaterally, normal work of breathing Heart- Regular rate and rhythm, no murmurs, rubs or gallops  GI- soft, NT, ND, + BS Extremities- no clubbing, cyanosis, or edema Skin- no rash or lesion Psych- euthymic mood, full affect Neuro- strength and sensation are intact  LABS: Basic Metabolic Panel:  Recent Labs  03/03/15 0816 03/04/15 0549  NA 138 142  K 4.0 3.7  CL 105 104  CO2 22 29  GLUCOSE 232* 117*  BUN 12 14  CREATININE 1.46* 1.42*  CALCIUM 8.7* 8.9   Liver Function Tests:  Recent Labs  03/03/15 0816  AST 48*  ALT 43  ALKPHOS 53  BILITOT 0.5  PROT 6.9  ALBUMIN 3.3*   CBC:  Recent Labs  03/03/15 0816 03/04/15 0549  WBC 3.6* 4.8  NEUTROABS 2.5  --   HGB 12.7* 13.2  HCT 37.0* 38.7*  MCV 91.1 92.4  PLT 146* 162      RADIOLOGY: Dg Chest 2 View 03/03/2015  CLINICAL DATA:  75 year old male with cough EXAM: CHEST  2 VIEW COMPARISON:  None. FINDINGS: Two views of the chest demonstrate a focal area of increased density at the right lung base with silhouetting of the right hemidiaphragm compatible with atelectasis versus pneumonia. A small right pleural effusion may be present. Left lung base subsegmental linear atelectatic changes noted. There is no pneumothorax. The right cardiac border is silhouetted. There is degenerative changes of the spine and left shoulder. No acute fracture. IMPRESSION: Right lung base opacity compatible with atelectasis/ pneumonia. Electronically Signed   By: Anner Crete M.D.   On: 03/03/2015 08:57   Ct Chest W Contrast 03/04/2015  CLINICAL DATA:  Shortness of breath on exertion EXAM: CT CHEST WITH CONTRAST TECHNIQUE: Multidetector CT imaging of the chest was performed during intravenous contrast administration. CONTRAST:  75 mL OMNIPAQUE IOHEXOL 300 MG/ML  SOLN COMPARISON:  03/03/2015 FINDINGS: The lungs are well aerated bilaterally. Minimal loculated fluid is noted laterally on the right along the minor fissure minimal atelectatic changes in the left lower lobe are noted. No focal confluent  infiltrate is seen. The thoracic inlet is within normal limits. The thoracic aorta show some mild calcific changes without aneurysmal dilatation. No hilar or mediastinal adenopathy is noted. Heavy coronary calcifications are noted. Scanning into the upper abdomen demonstrates multiple hypodensities within the liver which are incompletely evaluated. These likely represent cysts although further evaluation is recommended. IMPRESSION: Minimal right basilar atelectasis as well as a tiny amount of loculated fluid at the junction of the major and minor fissures on the right. Hypodensities within the liver likely representing cysts. Ultrasound of the abdomen is recommended for further evaluation. Coronary  calcifications predominately within the left anterior descending coronary artery. Electronically Signed   By: Inez Catalina M.D.   On: 03/04/2015 11:46    ASSESSMENT AND PLAN:  Active Problems:   Complete heart block (HCC)   Symptomatic bradycardia   1.  Symptomatic bradycardia Plan for PPM implant today Risks, benefits reviewed with patient who wishes to proceed  2.  ?pneumonia CT scan with minimal atelectasis as well as tiny amount of loculated fluid at junction of major and minor fissures Wood Novacek discontinue Vanc Recommend outpatient follow up of ?liver cysts  Chanetta Marshall, NP 03/04/2015 12:35 PM  I have seen and examined this patient with Chanetta Marshall.  Agree with above, note added to reflect my findings.  On exam, regular rhythm, no murmurs, lungs clear.  Had episode of syncope at home, then syncope x3 in the hospital associated with sinus arrest and asystole.  Had CXR with infiltration but CT scan shows no pneumonia.  Hersey Maclellan plan for dual chamber pacemaker placement today.  No obvious reversible causes.    Kyann Heydt M. Shelby Anderle MD 03/04/2015 12:52 PM

## 2015-03-05 ENCOUNTER — Encounter (HOSPITAL_COMMUNITY): Payer: Self-pay | Admitting: Cardiology

## 2015-03-05 ENCOUNTER — Inpatient Hospital Stay (HOSPITAL_COMMUNITY): Payer: Medicare Other

## 2015-03-05 MED ORDER — METOPROLOL SUCCINATE ER 50 MG PO TB24: 50.0000 mg | ORAL_TABLET | Freq: Every day | ORAL | Status: DC

## 2015-03-05 MED FILL — Heparin Sodium (Porcine) 2 Unit/ML in Sodium Chloride 0.9%: INTRAMUSCULAR | Qty: 500 | Status: AC

## 2015-03-05 NOTE — Progress Notes (Signed)
Slightly confused during rounds, disoriented to place and situation, easily reorientated will continue to monitor

## 2015-03-08 LAB — CULTURE, BLOOD (ROUTINE X 2)
Culture: NO GROWTH
Culture: NO GROWTH

## 2015-03-10 DIAGNOSIS — N529 Male erectile dysfunction, unspecified: Secondary | ICD-10-CM | POA: Insufficient documentation

## 2015-03-10 DIAGNOSIS — R9431 Abnormal electrocardiogram [ECG] [EKG]: Secondary | ICD-10-CM | POA: Insufficient documentation

## 2015-03-10 DIAGNOSIS — Z9119 Patient's noncompliance with other medical treatment and regimen: Secondary | ICD-10-CM | POA: Insufficient documentation

## 2015-03-10 DIAGNOSIS — F0280 Dementia in other diseases classified elsewhere without behavioral disturbance: Secondary | ICD-10-CM | POA: Insufficient documentation

## 2015-03-10 DIAGNOSIS — M1990 Unspecified osteoarthritis, unspecified site: Secondary | ICD-10-CM | POA: Insufficient documentation

## 2015-03-10 DIAGNOSIS — R590 Localized enlarged lymph nodes: Secondary | ICD-10-CM | POA: Insufficient documentation

## 2015-03-10 DIAGNOSIS — I252 Old myocardial infarction: Secondary | ICD-10-CM | POA: Insufficient documentation

## 2015-03-10 DIAGNOSIS — Z91199 Patient's noncompliance with other medical treatment and regimen due to unspecified reason: Secondary | ICD-10-CM | POA: Insufficient documentation

## 2015-03-10 DIAGNOSIS — M24573 Contracture, unspecified ankle: Secondary | ICD-10-CM | POA: Insufficient documentation

## 2015-03-10 DIAGNOSIS — R1310 Dysphagia, unspecified: Secondary | ICD-10-CM | POA: Insufficient documentation

## 2015-03-10 DIAGNOSIS — M24576 Contracture, unspecified foot: Secondary | ICD-10-CM

## 2015-03-10 DIAGNOSIS — M545 Low back pain, unspecified: Secondary | ICD-10-CM | POA: Insufficient documentation

## 2015-03-10 DIAGNOSIS — G300 Alzheimer's disease with early onset: Secondary | ICD-10-CM

## 2015-03-10 DIAGNOSIS — R609 Edema, unspecified: Secondary | ICD-10-CM | POA: Insufficient documentation

## 2015-03-10 DIAGNOSIS — R634 Abnormal weight loss: Secondary | ICD-10-CM | POA: Insufficient documentation

## 2015-03-10 DIAGNOSIS — B0223 Postherpetic polyneuropathy: Secondary | ICD-10-CM | POA: Insufficient documentation

## 2015-03-11 DIAGNOSIS — K7689 Other specified diseases of liver: Secondary | ICD-10-CM | POA: Insufficient documentation

## 2015-03-13 ENCOUNTER — Ambulatory Visit (HOSPITAL_COMMUNITY)
Admission: RE | Admit: 2015-03-13 | Discharge: 2015-03-13 | Disposition: A | Discharge status: Home / Self Care | Payer: Medicare Other | Source: Ambulatory Visit | Attending: Cardiology | Admitting: Cardiology

## 2015-03-13 ENCOUNTER — Ambulatory Visit (INDEPENDENT_AMBULATORY_CARE_PROVIDER_SITE_OTHER): Payer: Medicare Other | Admitting: *Deleted

## 2015-03-13 DIAGNOSIS — Z950 Presence of cardiac pacemaker: Secondary | ICD-10-CM

## 2015-03-13 DIAGNOSIS — I517 Cardiomegaly: Secondary | ICD-10-CM | POA: Insufficient documentation

## 2015-03-13 DIAGNOSIS — I442 Atrioventricular block, complete: Secondary | ICD-10-CM | POA: Diagnosis not present

## 2015-03-13 LAB — CUP PACEART INCLINIC DEVICE CHECK
Date Time Interrogation Session: 20170302175327
Implantable Lead Location: 753859
Lead Channel Impedance Value: 550 Ohm
Lead Channel Pacing Threshold Amplitude: 0.375 V
Lead Channel Pacing Threshold Pulse Width: 0.5 ms
Lead Channel Pacing Threshold Pulse Width: 0.5 ms
Lead Channel Sensing Intrinsic Amplitude: 3.4 mV
Lead Channel Sensing Intrinsic Amplitude: 3.7 mV
Lead Channel Setting Pacing Amplitude: 0.875
MDC IDC LEAD IMPLANT DT: 20170221
MDC IDC LEAD IMPLANT DT: 20170221
MDC IDC LEAD LOCATION: 753860
MDC IDC MSMT BATTERY REMAINING LONGEVITY: 139.2
MDC IDC MSMT BATTERY VOLTAGE: 3.17 V
MDC IDC MSMT LEADCHNL RA IMPEDANCE VALUE: 450 Ohm
MDC IDC MSMT LEADCHNL RV PACING THRESHOLD AMPLITUDE: 0.625 V
MDC IDC SET LEADCHNL RA PACING AMPLITUDE: 1.375
MDC IDC SET LEADCHNL RV PACING PULSEWIDTH: 0.5 ms
MDC IDC SET LEADCHNL RV SENSING SENSITIVITY: 1.5 mV
MDC IDC STAT BRADY RA PERCENT PACED: 25 %
MDC IDC STAT BRADY RV PERCENT PACED: 1.1 %
Pulse Gen Serial Number: 7877392

## 2015-03-13 NOTE — Progress Notes (Signed)
Wound check appointment. Steri-strips removed. Wound without redness or edema. Incision edges approximated, wound well healed. Normal device function. Thresholds impedances consistent with implant measurements. RA sensing consistent with implant, R-waves decreased from 17.1mV at implant to 3.25mV today. Reprogrammed RV sensitivity from 2.48mV to 1.19mV and ordered CXR. Auto capture programmed on for extra safety margin until 3 month visit. Histogram distribution appropriate for patient and level of activity. No mode switches or high ventricular rates noted. Patient educated about wound care, arm mobility, lifting restrictions. ROV with WC on 06/03/15. Patient and wife aware to proceed to [facility] for CXR after today's appointment. They verbalize understanding of all instructions.

## 2015-03-14 ENCOUNTER — Emergency Department (HOSPITAL_COMMUNITY): Payer: Medicare Other

## 2015-03-14 ENCOUNTER — Encounter (HOSPITAL_COMMUNITY): Payer: Self-pay | Admitting: Emergency Medicine

## 2015-03-14 ENCOUNTER — Emergency Department (HOSPITAL_COMMUNITY)
Admission: EM | Admit: 2015-03-14 | Discharge: 2015-03-15 | Disposition: A | Discharge status: Home / Self Care | Payer: Medicare Other | Attending: Emergency Medicine | Admitting: Emergency Medicine

## 2015-03-14 DIAGNOSIS — Z9841 Cataract extraction status, right eye: Secondary | ICD-10-CM | POA: Diagnosis not present

## 2015-03-14 DIAGNOSIS — I509 Heart failure, unspecified: Secondary | ICD-10-CM | POA: Insufficient documentation

## 2015-03-14 DIAGNOSIS — Z9842 Cataract extraction status, left eye: Secondary | ICD-10-CM | POA: Insufficient documentation

## 2015-03-14 DIAGNOSIS — Z79899 Other long term (current) drug therapy: Secondary | ICD-10-CM | POA: Diagnosis not present

## 2015-03-14 DIAGNOSIS — Z9889 Other specified postprocedural states: Secondary | ICD-10-CM | POA: Insufficient documentation

## 2015-03-14 DIAGNOSIS — H5462 Unqualified visual loss, left eye, normal vision right eye: Secondary | ICD-10-CM | POA: Insufficient documentation

## 2015-03-14 DIAGNOSIS — H538 Other visual disturbances: Secondary | ICD-10-CM | POA: Insufficient documentation

## 2015-03-14 DIAGNOSIS — Z87891 Personal history of nicotine dependence: Secondary | ICD-10-CM | POA: Insufficient documentation

## 2015-03-14 DIAGNOSIS — I252 Old myocardial infarction: Secondary | ICD-10-CM | POA: Diagnosis not present

## 2015-03-14 DIAGNOSIS — Z7982 Long term (current) use of aspirin: Secondary | ICD-10-CM | POA: Insufficient documentation

## 2015-03-14 DIAGNOSIS — H547 Unspecified visual loss: Secondary | ICD-10-CM

## 2015-03-14 DIAGNOSIS — I10 Essential (primary) hypertension: Secondary | ICD-10-CM | POA: Insufficient documentation

## 2015-03-14 LAB — DIFFERENTIAL
BASOS ABS: 0 10*3/uL (ref 0.0–0.1)
BASOS PCT: 0 %
Eosinophils Absolute: 0.3 10*3/uL (ref 0.0–0.7)
Eosinophils Relative: 6 %
LYMPHS ABS: 2.5 10*3/uL (ref 0.7–4.0)
Lymphocytes Relative: 42 %
MONOS PCT: 11 %
Monocytes Absolute: 0.6 10*3/uL (ref 0.1–1.0)
NEUTROS ABS: 2.4 10*3/uL (ref 1.7–7.7)
Neutrophils Relative %: 41 %

## 2015-03-14 LAB — COMPREHENSIVE METABOLIC PANEL
ALBUMIN: 3.7 g/dL (ref 3.5–5.0)
ALT: 38 U/L (ref 17–63)
ANION GAP: 11 (ref 5–15)
AST: 47 U/L — ABNORMAL HIGH (ref 15–41)
Alkaline Phosphatase: 68 U/L (ref 38–126)
BUN: 13 mg/dL (ref 6–20)
CO2: 26 mmol/L (ref 22–32)
Calcium: 9.3 mg/dL (ref 8.9–10.3)
Chloride: 101 mmol/L (ref 101–111)
Creatinine, Ser: 1.33 mg/dL — ABNORMAL HIGH (ref 0.61–1.24)
GFR calc non Af Amer: 50 mL/min — ABNORMAL LOW (ref >60)
GFR, EST AFRICAN AMERICAN: 58 mL/min — AB (ref >60)
GLUCOSE: 115 mg/dL — AB (ref 65–99)
POTASSIUM: 3.7 mmol/L (ref 3.5–5.1)
SODIUM: 138 mmol/L (ref 135–145)
Total Bilirubin: 0.6 mg/dL (ref 0.3–1.2)
Total Protein: 7.6 g/dL (ref 6.5–8.1)

## 2015-03-14 LAB — I-STAT CHEM 8, ED
BUN: 13 mg/dL (ref 6–20)
Calcium, Ion: 1.18 mmol/L (ref 1.13–1.30)
Chloride: 102 mmol/L (ref 101–111)
Creatinine, Ser: 1.2 mg/dL (ref 0.61–1.24)
Glucose, Bld: 108 mg/dL — ABNORMAL HIGH (ref 65–99)
HCT: 43 % (ref 39.0–52.0)
Hemoglobin: 14.6 g/dL (ref 13.0–17.0)
Potassium: 3.6 mmol/L (ref 3.5–5.1)
Sodium: 142 mmol/L (ref 135–145)
TCO2: 27 mmol/L (ref 0–100)

## 2015-03-14 LAB — PROTIME-INR
INR: 1.08 (ref 0.00–1.49)
Prothrombin Time: 14.2 seconds (ref 11.6–15.2)

## 2015-03-14 LAB — I-STAT TROPONIN, ED: Troponin i, poc: 0.01 ng/mL (ref 0.00–0.08)

## 2015-03-14 LAB — CBC
HEMATOCRIT: 38.9 % — AB (ref 39.0–52.0)
HEMOGLOBIN: 13.6 g/dL (ref 13.0–17.0)
MCH: 31.5 pg (ref 26.0–34.0)
MCHC: 35 g/dL (ref 30.0–36.0)
MCV: 90 fL (ref 78.0–100.0)
Platelets: 240 10*3/uL (ref 150–400)
RBC: 4.32 MIL/uL (ref 4.22–5.81)
RDW: 13.9 % (ref 11.5–15.5)
WBC: 5.9 10*3/uL (ref 4.0–10.5)

## 2015-03-14 LAB — APTT: APTT: 32 s (ref 24–37)

## 2015-03-14 LAB — CBG MONITORING, ED: GLUCOSE-CAPILLARY: 109 mg/dL — AB (ref 65–99)

## 2015-03-14 MED ORDER — TETRACAINE HCL 0.5 % OP SOLN
2.0000 [drp] | Freq: Once | OPHTHALMIC | Status: AC
Start: 2015-03-14 — End: 2015-03-14
  Administered 2015-03-14: 2 [drp] via OPHTHALMIC
  Filled 2015-03-14: qty 2

## 2015-03-14 NOTE — ED Notes (Signed)
The pt has no vision in his lt eye for awhile recent pacemaker placed

## 2015-03-14 NOTE — ED Notes (Signed)
The pt has had difficulty seeing from his rt eye since 0630 today.  His vision has gotten worse as the day progressed

## 2015-03-14 NOTE — ED Notes (Signed)
The pt has clear speech  No facial droop no weakness legs or arms

## 2015-03-14 NOTE — Discharge Instructions (Signed)
Visual Disturbances °You have had a disturbance in your vision. This may be caused by various conditions, such as: °· Migraines. Migraine headaches are often preceded by a disturbance in vision. Blind spots or light flashes are followed by a headache. This type of visual disturbance is temporary. It does not damage the eye. °· Glaucoma. This is caused by increased pressure in the eye. Symptoms include haziness, blurred vision, or seeing rainbow colored circles when looking at bright lights. Partial or complete visual loss can occur. You may or may not experience eye pain. Visual loss may be gradual or sudden and is irreversible. Glaucoma is the leading cause of blindness. °· Retina problems. Vision will be reduced if the retina becomes detached or if there is a circulation problem as with diabetes, high blood pressure, or a mini-stroke. Symptoms include seeing "floaters," flashes of light, or shadows, as if a curtain has fallen over your eye. °· Optic nerve problems. The main nerve in your eye can be damaged by redness, soreness, and swelling (inflammation), poor circulation, drugs, and toxins. °It is very important to have a complete exam done by a specialist to determine the exact cause of your eye problem. The specialist may recommend medicines or surgery, depending on the cause of the problem. This can help prevent further loss of vision or reduce the risk of having a stroke. Contact the caregiver to whom you have been referred and arrange for follow-up care right away. °SEEK IMMEDIATE MEDICAL CARE IF:  °· Your vision gets worse. °· You develop severe headaches. °· You have any weakness or numbness in the face, arms, or legs. °· You have any trouble speaking or walking. °  °This information is not intended to replace advice given to you by your health care provider. Make sure you discuss any questions you have with your health care provider. °  °Document Released: 02/05/2004 Document Revised: 03/22/2011 Document  Reviewed: 06/06/2013 °Elsevier Interactive Patient Education ©2016 [employer] ° °

## 2015-03-14 NOTE — ED Provider Notes (Signed)
CSN: [id number]     Arrival date & time 03/14/15  1957 History   First MD Initiated Contact with Patient 03/14/15 2129     Chief Complaint  Patient presents with  . 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 Ports his right eye is a good eye. His morning he awakened with significant general blurring of the right eye. Tried to drive a vehicle around noon and his vision was so poor that he could not make out his exit. Fortunately, his wife was following him and another vehicle and he was able to discern that she had made Lane changes and used her signal which helped him figure out where he was going. She reports once they got to their destination, he reported he could hardly see anything. He asked her if it was very foggy outside. He denies he had any episodes of double vision. Vision had not gone black and he cannot recall ever seeing a portion of the vision seem like it had a blanket over it. On their way home however around 3 PM he had the impression that things were in the middle of the road is a were driving. At that time he was a passenger and no longer driving. Things are somewhat improved later in the evening but he continues to have general blurring of his vision and inability to discern any finer details. No associated eye pain. No associated gait instability, confusion or focal weakness numbness and tingling. Past Medical History  Diagnosis Date  . Hypertension   . CHF (congestive heart failure) ([place])   . Heart attack [facility]) 'many years ago'   Past Surgical History  Procedure Laterality Date  . Cardiac catheterization    . Ep implantable device N/A 03/04/2015    Procedure: Pacemaker Implant;  Surgeon: Will Meredith Leeds, MD;  Location: [place] [facility];  Service: Cardiovascular;  Laterality: N/A;   Family History  Problem Relation Age of  Onset  . Diabetes Sister   . Diabetes Brother    Social History  Substance Use Topics  . Smoking status: Former Research scientist (life sciences)  . Smokeless tobacco: None  . Alcohol Use: No    Review of Systems  10 Systems reviewed and are negative for acute change except as noted in the HPI.   Allergies  Novocain; Rosuvastatin calcium; and Statins  Home Medications   Prior to Admission medications   Medication Sig Start Date End Date Taking? Authorizing Provider  aspirin EC 81 MG tablet Take 81 mg by mouth daily.   Yes Historical Provider, MD  Coenzyme Q10 (COQ10 PO) Take 1 capsule by mouth daily as needed (takes occasionally).   Yes Historical Provider, MD  HYDROcodone-homatropine (HYCODAN) 5-1.5 MG/5ML syrup Take 5 mLs by mouth every 6 (six) hours as needed for cough.   Yes Historical Provider, MD  metoprolol succinate (TOPROL XL) 50 MG 24 hr tablet Take 1 tablet (50 mg total) by mouth daily. Take with or immediately following a meal. 03/05/15  Yes Amber Sena Slate, NP  Multiple Vitamin (MULTIVITAMIN WITH MINERALS) TABS tablet Take 1 tablet by mouth daily as needed (takes occasionally).    Yes Historical Provider, MD  nitroGLYCERIN (NITROSTAT) 0.4 MG SL tablet Place 0.4 mg under the tongue daily as needed for chest pain. Chest pain   Yes Historical Provider, MD  Omega-3 1000 MG CAPS Take 1 g by mouth daily as needed (  takes occasionally).    Yes Historical Provider, MD  ramipril (ALTACE) 2.5 MG capsule Take 2.5 mg by mouth daily.   Yes Historical Provider, MD  torsemide (DEMADEX) 20 MG tablet Take 20 mg by mouth daily.   Yes Historical Provider, MD  vitamin B-12 (CYANOCOBALAMIN) 1000 MCG tablet Take 1,000 mcg by mouth daily as needed (takes occasionally).    Yes Historical Provider, MD   BP 137/89 mmHg  Pulse 68  Temp(Src) 97.7 F (36.5 C)  Resp 18  SpO2 96% Physical Exam  Constitutional: He is oriented to person, place, and time. He appears well-developed and well-nourished.  HENT:  Head:  Normocephalic and atraumatic.  Eyes: EOM are normal. Pupils are equal, round, and reactive to light.  Patient has bilateral cataract repairs. Pupils are symmetric and responsive. Extraocular motions are intact. sclera are clear. Funduscopic examination very limited. There is positive red reflexion and vessels to the optic disc are discernable. Outer margins of the retina are difficult to visualize, no obvious area of darkness to suggest detachment.  Tono-Pen intraocular pressure for the right eye was 16 and 17.  Neck: Neck supple.  Cardiovascular: Normal rate, regular rhythm, normal heart sounds and intact distal pulses.   Pulmonary/Chest: Effort normal and breath sounds normal.  Abdominal: Soft. Bowel sounds are normal. He exhibits no distension. There is no tenderness.  Musculoskeletal: Normal range of motion. He exhibits no edema.  Neurological: He is alert and oriented to person, place, and time. He has normal strength. Coordination normal. GCS eye subscore is 4. GCS verbal subscore is 5. GCS motor subscore is 6.  Skin: Skin is warm, dry and intact.  Psychiatric: He has a normal mood and affect.    ED Course  Procedures (including critical care time) Labs Review Labs Reviewed  CBC - Abnormal; Notable for the following:    HCT 38.9 (*)    All other components within normal limits  COMPREHENSIVE METABOLIC PANEL - Abnormal; Notable for the following:    Glucose, Bld 115 (*)    Creatinine, Ser 1.33 (*)    AST 47 (*)    GFR calc non Af Amer 50 (*)    GFR calc Af Amer 58 (*)    All other components within normal limits  CBG MONITORING, ED - Abnormal; Notable for the following:    Glucose-Capillary 109 (*)    All other components within normal limits  I-STAT CHEM 8, ED - Abnormal; Notable for the following:    Glucose, Bld 108 (*)    All other components within normal limits  PROTIME-INR  APTT  DIFFERENTIAL  Randolm Idol, ED    Imaging Review Dg Chest 2 View  03/13/2015   CLINICAL DATA:  Shortness of breath for 1 month. Pacemaker placement. Evaluate RV lead placement. EXAM: CHEST  2 VIEW COMPARISON:  03/05/2015 and prior exams. FINDINGS: Cardiomegaly again noted. A left-sided dual lead pacemaker is unchanged with leads overlying the regions of the right atrium and right ventricle. Elevation of the right hemidiaphragm is again noted. There is no evidence of focal airspace disease, pulmonary edema, suspicious pulmonary nodule/mass, pleural effusion, or pneumothorax. No acute bony abnormalities are identified. IMPRESSION: Cardiomegaly without evidence of acute cardiopulmonary disease. Unchanged left-sided dual lead pacemaker with leads overlying the regions of the right atrium and right ventricle. Electronically Signed   By: Margarette Canada M.D.   On: 03/13/2015 17:03   Ct Head Wo Contrast  03/14/2015  CLINICAL DATA:  Acute onset of blurred vision.  Initial encounter. EXAM: CT HEAD WITHOUT CONTRAST TECHNIQUE: Contiguous axial images were obtained from the base of the skull through the vertex without intravenous contrast. COMPARISON:  None. FINDINGS: There is no evidence of acute infarction, mass lesion, or intra- or extra-axial hemorrhage on CT. Prominence of the ventricles and sulci reflects mild cortical volume loss. Mild scattered periventricular white matter change likely reflects small vessel ischemic microangiopathy. The brainstem and fourth ventricle are within normal limits. The basal ganglia are unremarkable in appearance. The cerebral hemispheres demonstrate grossly normal gray-white differentiation. No mass effect or midline shift is seen. There is no evidence of fracture; visualized osseous structures are unremarkable in appearance. The orbits are within normal limits. The paranasal sinuses and mastoid air cells are well-aerated. No significant soft tissue abnormalities are seen. IMPRESSION: 1. No acute intracranial pathology seen on CT. 2. Mild cortical volume loss and  scattered small vessel ischemic microangiopathy. Electronically Signed   By: Garald Balding M.D.   On: 03/14/2015 23:26   I have personally reviewed and evaluated these images and lab results as part of my medical decision-making.   EKG Interpretation None     Consult: (23:50) reviewed with Dr. Herbert Deaner of ophthalmology. She will see the patient in her office at 10 AM in the morning. If intraocular pressure less than 25 no change to management plan MDM   Final diagnoses:  Visual loss   Patient has had visual blurring without focal neurologic deficit. Vital signs are stable and neurologic examination is intact. Intraocular pressure is normal. This time, I cannot appreciate evident retinal detachment. Patient's case has been reviewed with Dr. Herbert Deaner of ophthalmology and he will be seen in the morning at 10 AM.    Charlesetta Shanks, MD 03/15/15 [phone]

## 2015-03-14 NOTE — ED Notes (Signed)
Spoke with patient extensively. Pt states LSN was this morning. Pt states "I woke up today and my vision was off, was more blurry than normal. It got even more worse at 3pm". No neuro deficits at nurse first, pt is AAOX4, grip strength equal, ambulatory with steady gait. No facial droop.

## 2015-03-14 NOTE — ED Notes (Signed)
Assessment and acuity by chris cbrisco rn not kyle nt

## 2015-03-15 DIAGNOSIS — H3341 Traction detachment of retina, right eye: Secondary | ICD-10-CM | POA: Insufficient documentation

## 2015-03-25 ENCOUNTER — Telehealth: Payer: Self-pay | Admitting: Cardiology

## 2015-03-25 ENCOUNTER — Encounter: Payer: Self-pay | Admitting: *Deleted

## 2015-03-25 ENCOUNTER — Encounter: Payer: Self-pay | Admitting: Cardiology

## 2015-03-25 ENCOUNTER — Ambulatory Visit (INDEPENDENT_AMBULATORY_CARE_PROVIDER_SITE_OTHER): Payer: Medicare Other | Admitting: Cardiology

## 2015-03-25 VITALS — BP 110/78 | HR 88 | Ht 72.0 in | Wt 228.0 lb

## 2015-03-25 DIAGNOSIS — Z01812 Encounter for preprocedural laboratory examination: Secondary | ICD-10-CM

## 2015-03-25 DIAGNOSIS — R55 Syncope and collapse: Secondary | ICD-10-CM

## 2015-03-25 LAB — CUP PACEART INCLINIC DEVICE CHECK
Battery Voltage: 3.13 V
Brady Statistic RA Percent Paced: 36 %
Brady Statistic RV Percent Paced: 0.12 %
Date Time Interrogation Session: 20170314122356
Implantable Lead Implant Date: 20170221
Implantable Lead Location: 753860
Lead Channel Impedance Value: 462.5 Ohm
Lead Channel Impedance Value: 487.5 Ohm
Lead Channel Pacing Threshold Amplitude: 0.5 V
Lead Channel Pacing Threshold Pulse Width: 0.5 ms
Lead Channel Sensing Intrinsic Amplitude: 4.2 mV
Lead Channel Setting Pacing Amplitude: 0.625
Lead Channel Setting Pacing Amplitude: 1.375
Lead Channel Setting Sensing Sensitivity: 1.5 mV
MDC IDC LEAD IMPLANT DT: 20170221
MDC IDC LEAD LOCATION: 753859
MDC IDC MSMT BATTERY REMAINING LONGEVITY: 138
MDC IDC MSMT LEADCHNL RA PACING THRESHOLD AMPLITUDE: 0.5 V
MDC IDC MSMT LEADCHNL RA PACING THRESHOLD AMPLITUDE: 0.5 V
MDC IDC MSMT LEADCHNL RA PACING THRESHOLD PULSEWIDTH: 0.5 ms
MDC IDC MSMT LEADCHNL RA PACING THRESHOLD PULSEWIDTH: 0.5 ms
MDC IDC MSMT LEADCHNL RA SENSING INTR AMPL: 4.4 mV
MDC IDC MSMT LEADCHNL RV PACING THRESHOLD AMPLITUDE: 0.5 V
MDC IDC MSMT LEADCHNL RV PACING THRESHOLD PULSEWIDTH: 0.5 ms
MDC IDC SET LEADCHNL RV PACING PULSEWIDTH: 0.5 ms
Pulse Gen Model: 2240
Pulse Gen Serial Number: 7877392

## 2015-03-25 NOTE — Progress Notes (Signed)
Electrophysiology Office Note   Date:  03/25/2015   ID:  [name], DOB 1940-04-08, MRN [id number]  PCP:  Brandon Berger, MD Primary Electrophysiologist:  Constance Haw, MD    Chief Complaint  Patient presents with  . Pacemaker Check     History of Present Illness: [name] is a 75 y.o. male who presents today for electrophysiology evaluation.   He presented to the hospital on 2/20 with syncope.  He underwent implantation of a SJM dual chamber pacemaker.  Since that time, he has had a device check which showed R waves that had decreased from 17.9 to 3.7 mV.  He had a CXR which showed no changes in lead position.     Today, he denies symptoms of palpitations, chest pain, shortness of breath, orthopnea, PND, lower extremity edema, claudication, dizziness, presyncope, syncope, bleeding, or neurologic sequela. The patient is tolerating medications without difficulties and is otherwise without complaint today. He has been having episodes of confusion. Otherwise he feels well. He says that his primary physician does know about the confusion.   Past Medical History  Diagnosis Date  . Hypertension   . CHF (congestive heart failure) ([place])   . Heart attack [facility]) 'many years ago'   Past Surgical History  Procedure Laterality Date  . Cardiac catheterization    . Ep implantable device N/A 03/04/2015    Procedure: Pacemaker Implant;  Surgeon: Will Meredith Leeds, MD;  Location: [place] [facility];  Service: Cardiovascular;  Laterality: N/A;     Current Outpatient Prescriptions  Medication Sig Dispense Refill  . Coenzyme Q10 (COQ10 PO) Take 1 capsule by mouth daily as needed (takes occasionally).    . metoprolol succinate (TOPROL XL) 50 MG 24 hr tablet Take 1 tablet (50 mg total) by mouth daily. Take with or immediately following a meal. 30 tablet 1  . Multiple Vitamin (MULTIVITAMIN WITH MINERALS) TABS tablet Take 1 tablet by mouth daily as needed (takes occasionally).      . nitroGLYCERIN (NITROSTAT) 0.4 MG SL tablet Place 0.4 mg under the tongue daily as needed for chest pain. Chest pain    . Omega-3 1000 MG CAPS Take 1 g by mouth daily as needed (takes occasionally).     . ramipril (ALTACE) 2.5 MG capsule Take 2.5 mg by mouth daily.    Marland Kitchen torsemide (DEMADEX) 20 MG tablet Take 20 mg by mouth daily.     No current facility-administered medications for this visit.    Allergies:   Novocain; Rosuvastatin calcium; and Statins   Social History:  The patient  reports that he has quit smoking. He does not have any smokeless tobacco history on file. He reports that he does not drink alcohol or use illicit drugs.   Family History:  The patient's family history includes Diabetes in his brother and sister.    ROS:  Please see the history of present illness.   Otherwise, review of systems is positive for SOB, fatigue, depression, anxiety, constipation.   All other systems are reviewed and negative.    PHYSICAL EXAM: VS:  BP 110/78 mmHg  Pulse 88  Ht 6' (1.829 m)  Wt 228 lb (103.42 kg)  BMI 30.92 kg/m2 , BMI Body mass index is 30.92 kg/(m^2). GEN: Well nourished, well developed, in no acute distress HEENT: normal Neck: no JVD, carotid bruits, or masses Cardiac: RRR; no murmurs, rubs, or gallops,no edema  Respiratory:  clear to auscultation bilaterally, normal work of breathing GI: soft, nontender, nondistended, + BS  MS: no deformity or atrophy Skin: warm and dry,  device pocket is well healed Neuro:  Strength and sensation are intact Psych: euthymic mood, full affect   Device interrogation is reviewed today in detail.  See PaceArt for details.   Recent Labs: 03/03/2015: B Natriuretic Peptide 17.6 03/14/2015: ALT 38; BUN 13; Creatinine, Ser 1.20; Hemoglobin 14.6; Platelets 240; Potassium 3.6; Sodium 142    Lipid Panel  No results found for: CHOL, TRIG, HDL, CHOLHDL, VLDL, LDLCALC, LDLDIRECT   Wt Readings from Last 3 Encounters:  03/25/15 228 lb  (103.42 kg)  03/05/15 224 lb 3.2 oz (101.696 kg)      Other studies Reviewed: Additional studies/ records that were reviewed today include:  CXR 03/13/15 demonstrates:  Cardiomegaly without evidence of acute cardiopulmonary disease.  Unchanged left-sided dual lead pacemaker with leads overlying the regions of the right atrium and right ventricle.   ASSESSMENT AND PLAN:  1.  Syncope: Dual chamber pacemaker in place.  Had dropping R waves to 3.7 mV.  Recheck today shows R waves of 2.5-4 and threshold of 0.375.  I discussed with him the options, and both him and his wife feel that lead revision is probably the best idea. We will plan on RV lead revision as soon as we can. The risks of the procedure include bleeding, infection, tamponade. I will try and reuse the same RV lead therefore they will not be a risk of pneumothorax.    Current medicines are reviewed at length with the patient today.   The patient does not have concerns regarding his medicines.  The following changes were made today:  none  Labs/ tests ordered today include:  No orders of the defined types were placed in this encounter.     Disposition:   FU with Will Camnitz post lead revsion    Signed, Will Meredith Leeds, MD  03/25/2015 10:40 AM     [facility] [address] [place] [address] [phone] (office) [phone] (fax)

## 2015-03-25 NOTE — Patient Instructions (Signed)
Medication Instructions:  Your physician recommends that you continue on your current medications as directed. Please refer to the Current Medication list given to you today.  - If you need a refill on your cardiac medications before your next appointment, please call your pharmacy.  Labwork: Your physician recommends that you return for lab work on: 04/03/2015.  Testing/Procedures: Your physician has recommended that you have a pacemaker lead revision. This is done in the hospital and usually requires and overnight stay. Please see the instruction sheet given to you today for more information.  Follow-Up: Your physician recommends that you schedule a follow-up appointment in: 10-14 days, after procedure on 04/10/15, in device clinic for wound check.  Your physician recommends that you schedule a follow-up appointment in: 3 months, after procedure on 04/10/15, with Dr. Curt Bears.  Thank you for choosing [facility]!!   Trinidad Curet, RN [phone]

## 2015-03-25 NOTE — Telephone Encounter (Signed)
Follow Up   Pt wife called . Will they be able to sill go ont he trip by may 2nd if they still have the CATH on 05/11/2015. Please call back to discuss

## 2015-04-03 ENCOUNTER — Other Ambulatory Visit (INDEPENDENT_AMBULATORY_CARE_PROVIDER_SITE_OTHER): Payer: Medicare Other | Admitting: *Deleted

## 2015-04-03 DIAGNOSIS — R55 Syncope and collapse: Secondary | ICD-10-CM

## 2015-04-03 DIAGNOSIS — Z01812 Encounter for preprocedural laboratory examination: Secondary | ICD-10-CM

## 2015-04-03 LAB — CBC WITH DIFFERENTIAL/PLATELET
BASOS ABS: 0 10*3/uL (ref 0.0–0.1)
BASOS PCT: 0 % (ref 0–1)
Eosinophils Absolute: 0.3 10*3/uL (ref 0.0–0.7)
Eosinophils Relative: 6 % — ABNORMAL HIGH (ref 0–5)
HCT: 38.2 % — ABNORMAL LOW (ref 39.0–52.0)
HEMOGLOBIN: 13 g/dL (ref 13.0–17.0)
LYMPHS PCT: 34 % (ref 12–46)
Lymphs Abs: 1.7 10*3/uL (ref 0.7–4.0)
MCH: 31 pg (ref 26.0–34.0)
MCHC: 34 g/dL (ref 30.0–36.0)
MCV: 91 fL (ref 78.0–100.0)
MPV: 9.4 fL (ref 8.6–12.4)
Monocytes Absolute: 0.5 10*3/uL (ref 0.1–1.0)
Monocytes Relative: 9 % (ref 3–12)
NEUTROS ABS: 2.6 10*3/uL (ref 1.7–7.7)
NEUTROS PCT: 51 % (ref 43–77)
PLATELETS: 169 10*3/uL (ref 150–400)
RBC: 4.2 MIL/uL — AB (ref 4.22–5.81)
RDW: 15 % (ref 11.5–15.5)
WBC: 5.1 10*3/uL (ref 4.0–10.5)

## 2015-04-03 LAB — BASIC METABOLIC PANEL
BUN: 10 mg/dL (ref 7–25)
CALCIUM: 8.9 mg/dL (ref 8.6–10.3)
CO2: 27 mmol/L (ref 20–31)
Chloride: 102 mmol/L (ref 98–110)
Creat: 1.26 mg/dL — ABNORMAL HIGH (ref 0.70–1.18)
Glucose, Bld: 107 mg/dL — ABNORMAL HIGH (ref 65–99)
POTASSIUM: 3.8 mmol/L (ref 3.5–5.3)
SODIUM: 139 mmol/L (ref 135–146)

## 2015-04-10 ENCOUNTER — Ambulatory Visit (HOSPITAL_COMMUNITY)
Admission: RE | Admit: 2015-04-10 | Discharge: 2015-04-11 | Disposition: A | Discharge status: Home / Self Care | Payer: Medicare Other | Source: Ambulatory Visit | Attending: Cardiology | Admitting: Cardiology

## 2015-04-10 ENCOUNTER — Encounter (HOSPITAL_COMMUNITY)
Admission: RE | Disposition: A | Discharge status: Home / Self Care | Payer: Self-pay | Source: Ambulatory Visit | Attending: Cardiology

## 2015-04-10 ENCOUNTER — Encounter (HOSPITAL_COMMUNITY): Payer: Self-pay | Admitting: Cardiology

## 2015-04-10 DIAGNOSIS — G4733 Obstructive sleep apnea (adult) (pediatric): Secondary | ICD-10-CM | POA: Diagnosis not present

## 2015-04-10 DIAGNOSIS — T82120A Displacement of cardiac electrode, initial encounter: Secondary | ICD-10-CM | POA: Diagnosis not present

## 2015-04-10 DIAGNOSIS — I2510 Atherosclerotic heart disease of native coronary artery without angina pectoris: Secondary | ICD-10-CM | POA: Diagnosis not present

## 2015-04-10 DIAGNOSIS — I10 Essential (primary) hypertension: Secondary | ICD-10-CM | POA: Diagnosis not present

## 2015-04-10 DIAGNOSIS — Z95818 Presence of other cardiac implants and grafts: Secondary | ICD-10-CM

## 2015-04-10 DIAGNOSIS — R55 Syncope and collapse: Secondary | ICD-10-CM | POA: Insufficient documentation

## 2015-04-10 HISTORY — PX: EP IMPLANTABLE DEVICE: SHX172B

## 2015-04-10 LAB — SURGICAL PCR SCREEN
MRSA, PCR: NEGATIVE
STAPHYLOCOCCUS AUREUS: NEGATIVE

## 2015-04-10 SURGERY — LEAD REVISION/REPAIR

## 2015-04-10 MED ORDER — HEPARIN (PORCINE) IN NACL 2-0.9 UNIT/ML-% IJ SOLN
INTRAMUSCULAR | Status: AC
  Filled 2015-04-10: qty 1000

## 2015-04-10 MED ORDER — SODIUM CHLORIDE 0.9 % IV SOLN
INTRAVENOUS | Status: DC
  Administered 2015-04-10: 06:00:00 via INTRAVENOUS

## 2015-04-10 MED ORDER — FENTANYL CITRATE (PF) 100 MCG/2ML IJ SOLN
INTRAMUSCULAR | Status: DC | PRN
  Administered 2015-04-10: 25 ug via INTRAVENOUS

## 2015-04-10 MED ORDER — LIDOCAINE HCL (PF) 1 % IJ SOLN
INTRAMUSCULAR | Status: DC | PRN
  Administered 2015-04-10: 34 mL via SUBCUTANEOUS

## 2015-04-10 MED ORDER — MIDAZOLAM HCL 5 MG/5ML IJ SOLN
INTRAMUSCULAR | Status: AC
  Filled 2015-04-10: qty 5

## 2015-04-10 MED ORDER — CEFAZOLIN SODIUM 1-5 GM-% IV SOLN
INTRAVENOUS | Status: AC
  Filled 2015-04-10: qty 50

## 2015-04-10 MED ORDER — FENTANYL CITRATE (PF) 100 MCG/2ML IJ SOLN
INTRAMUSCULAR | Status: AC
  Filled 2015-04-10: qty 2

## 2015-04-10 MED ORDER — GENTAMICIN SULFATE 40 MG/ML IJ SOLN
INTRAMUSCULAR | Status: AC
  Filled 2015-04-10: qty 2

## 2015-04-10 MED ORDER — RAMIPRIL 2.5 MG PO CAPS
2.5000 mg | ORAL_CAPSULE | Freq: Every day | ORAL | Status: DC
  Administered 2015-04-10: 2.5 mg via ORAL
  Filled 2015-04-10 (×3): qty 1

## 2015-04-10 MED ORDER — SODIUM CHLORIDE 0.9 % IR SOLN
80.0000 mg | Status: AC
  Administered 2015-04-10: 80 mg
  Filled 2015-04-10: qty 2

## 2015-04-10 MED ORDER — DIPHENHYDRAMINE HCL 25 MG PO CAPS
25.0000 mg | ORAL_CAPSULE | Freq: Once | ORAL | Status: AC
  Administered 2015-04-10: 25 mg via ORAL
  Filled 2015-04-10: qty 1

## 2015-04-10 MED ORDER — CEFAZOLIN SODIUM-DEXTROSE 2-3 GM-% IV SOLR
INTRAVENOUS | Status: AC
  Filled 2015-04-10: qty 50

## 2015-04-10 MED ORDER — ACETAMINOPHEN 325 MG PO TABS: 325.0000 mg | ORAL_TABLET | ORAL | Status: DC | PRN

## 2015-04-10 MED ORDER — NITROGLYCERIN 0.4 MG SL SUBL: 0.4000 mg | SUBLINGUAL_TABLET | Freq: Every day | SUBLINGUAL | Status: DC | PRN

## 2015-04-10 MED ORDER — MIDAZOLAM HCL 5 MG/5ML IJ SOLN
INTRAMUSCULAR | Status: DC | PRN
  Administered 2015-04-10 (×2): 1 mg via INTRAVENOUS

## 2015-04-10 MED ORDER — ADULT MULTIVITAMIN W/MINERALS CH: 1.0000 | ORAL_TABLET | Freq: Every day | ORAL | Status: DC | PRN

## 2015-04-10 MED ORDER — CEFAZOLIN SODIUM 1-5 GM-% IV SOLN
1.0000 g | Freq: Four times a day (QID) | INTRAVENOUS | Status: AC
  Administered 2015-04-10 (×3): 1 g via INTRAVENOUS
  Filled 2015-04-10 (×2): qty 50

## 2015-04-10 MED ORDER — DEXTROSE 5 % IV SOLN
2.0000 g | INTRAVENOUS | Status: AC
  Administered 2015-04-10: 2 g via INTRAVENOUS
  Filled 2015-04-10: qty 20

## 2015-04-10 MED ORDER — ONDANSETRON HCL 4 MG/2ML IJ SOLN: 4.0000 mg | Freq: Four times a day (QID) | INTRAMUSCULAR | Status: DC | PRN

## 2015-04-10 MED ORDER — MUPIROCIN 2 % EX OINT
TOPICAL_OINTMENT | CUTANEOUS | Status: AC
  Administered 2015-04-10: 1
  Filled 2015-04-10: qty 22

## 2015-04-10 MED ORDER — TORSEMIDE 20 MG PO TABS
20.0000 mg | ORAL_TABLET | Freq: Every day | ORAL | Status: DC
  Administered 2015-04-10: 20 mg via ORAL
  Filled 2015-04-10: qty 1

## 2015-04-10 MED ORDER — HEPARIN (PORCINE) IN NACL 2-0.9 UNIT/ML-% IJ SOLN
INTRAMUSCULAR | Status: DC | PRN
  Administered 2015-04-10: 500 mL

## 2015-04-10 MED ORDER — METOPROLOL SUCCINATE ER 50 MG PO TB24
50.0000 mg | ORAL_TABLET | Freq: Every day | ORAL | Status: DC
  Administered 2015-04-10: 50 mg via ORAL
  Filled 2015-04-10: qty 1

## 2015-04-10 MED ORDER — OMEGA-3-ACID ETHYL ESTERS 1 G PO CAPS: 1000.0000 mg | ORAL_CAPSULE | Freq: Every day | ORAL | Status: DC | PRN

## 2015-04-10 SURGICAL SUPPLY — 3 items
CABLE SURGICAL S-101-97-12 (CABLE) ×3 IMPLANT
PAD DEFIB LIFELINK (PAD) ×3 IMPLANT
TRAY PACEMAKER INSERTION (PACKS) ×3 IMPLANT

## 2015-04-10 NOTE — Discharge Instructions (Signed)
° ° °  Supplemental Discharge Instructions for  Pacemaker/Defibrillator Patients  Activity No heavy lifting or vigorous activity with your left/right arm for 6 to 8 weeks.  Do not raise your left/right arm above your head for one week.  Gradually raise your affected arm as drawn below.              04/15/15                      04/16/15                       04/17/15                     04/18/15 __  NO DRIVING for  1 week   ; you may begin driving on   [id number]  .  WOUND CARE - Keep the wound area clean and dry.  Do not get this area wet for one week. No showers for one week; you may shower on  04/18/15   . - The tape/steri-strips on your wound will fall off; do not pull them off.  No bandage is needed on the site.  DO  NOT apply any creams, oils, or ointments to the wound area. - If you notice any drainage or discharge from the wound, any swelling or bruising at the site, or you develop a fever > 101? F after you are discharged home, call the office at once.  Special Instructions - You are still able to use cellular telephones; use the ear opposite the side where you have your pacemaker/defibrillator.  Avoid carrying your cellular phone near your device. - When traveling through airports, show security personnel your identification card to avoid being screened in the metal detectors.  Ask the security personnel to use the hand wand. - Avoid arc welding equipment, MRI testing (magnetic resonance imaging), TENS units (transcutaneous nerve stimulators).  Call the office for questions about other devices. - Avoid electrical appliances that are in poor condition or are not properly grounded. - Microwave ovens are safe to be near or to operate.  Additional information for defibrillator patients should your device go off: - If your device goes off ONCE and you feel fine afterward, notify the device clinic nurses. - If your device goes off ONCE and you do not feel well afterward, call 911. - If your device  goes off TWICE, call 911. - If your device goes off THREE times in one day, call 911.  DO NOT DRIVE YOURSELF OR A FAMILY MEMBER WITH A DEFIBRILLATOR TO THE HOSPITAL--CALL 911.

## 2015-04-10 NOTE — Progress Notes (Addendum)
Per Dr Curt Bears , pt was able to take his home med Metoprolo 50mg  PO given to him by his wife .

## 2015-04-10 NOTE — H&P (Signed)
[name] was seen and examined today.  He had a pacemaker placed 2/20 for syncope.  His R waves have gone down from 17 to 3.7.  He presents for RV lead revision today.  Risks and benefits explained.  Risks include bleeding, tamponade, pneumothorax, infection.  Patient understands the risks and has agreed to the procedure.  Ledonna Dormer Curt Bears, MD 04/10/2015 7:09 AM

## 2015-04-11 ENCOUNTER — Ambulatory Visit (HOSPITAL_COMMUNITY): Payer: Medicare Other

## 2015-04-11 DIAGNOSIS — R55 Syncope and collapse: Secondary | ICD-10-CM | POA: Diagnosis not present

## 2015-04-11 DIAGNOSIS — G4733 Obstructive sleep apnea (adult) (pediatric): Secondary | ICD-10-CM | POA: Diagnosis not present

## 2015-04-11 DIAGNOSIS — I10 Essential (primary) hypertension: Secondary | ICD-10-CM | POA: Diagnosis not present

## 2015-04-11 DIAGNOSIS — I2510 Atherosclerotic heart disease of native coronary artery without angina pectoris: Secondary | ICD-10-CM | POA: Diagnosis not present

## 2015-04-11 MED FILL — Cefazolin Sodium for IV Soln 2 GM and Dextrose 3% (50 ML): INTRAVENOUS | Qty: 50 | Status: AC

## 2015-04-11 MED FILL — Heparin Sodium (Porcine) 2 Unit/ML in Sodium Chloride 0.9%: INTRAMUSCULAR | Qty: 500 | Status: AC

## 2015-04-11 NOTE — Discharge Summary (Signed)
ELECTROPHYSIOLOGY PROCEDURE DISCHARGE SUMMARY    Patient ID: [name],  MRN: [id number], DOB/AGE: [birth year] 75 y.o.  Admit date: 04/10/2015 Discharge date: 04/11/2015  Primary Care Physician: Maris Berger, MD Primary Cardiologist/Electrophysiologist: [facility]/ Dr. Curt Bears  Primary Discharge Diagnosis:  1. PPM lead displacement   Secondary Discharge Diagnosis:  1. CAD 2. HTN 3. OSA  Allergies  Allergen Reactions  . Novocain [Procaine] Other (See Comments)    Hallucinations  . Rosuvastatin Calcium Other (See Comments)    Whole body ache/pain  . Statins Other (See Comments)    Whole body ache/pain     Procedures This Admission:  1.  PPM lead revision on 04/10/15 by Dr Curt Bears.  2.  CXR on 04/11/15.    demonstrated no pneumothorax status post device implantation.   Brief HPI: [name] is a 75 y.o. male was noted at his wound check device check visit to have decreased R waves and concern for RV lead movement though not apparent on CXR.   Past medical history includes CAD, HTN, OSA. Risks, benefits, and alternatives to lead revisioin were reviewed with the patient who wished to proceed.   Hospital Course:  The patient was admitted and underwent PPM lead revison with details as outlined in the procedure note.  He was monitored on telemetry overnight which demonstrated SR, occ pacing.  Left chest was without hematoma or ecchymosis.  The device was interrogated and found to be functioning normally.  CXR was obtained and demonstrated no pneumothorax status post device implantation.  Wound care, arm mobility, and restrictions were reviewed with the patient.  The patient was examined by Dr. Curt Bears and considered stable for discharge to home.    Physical Exam: Filed Vitals:   04/10/15 1517 04/10/15 1802 04/10/15 2000 04/11/15 0451  BP: 135/80 129/85 143/93 138/90  Pulse: 59 60 60 61  Temp: 97.9 F (36.6 C)  98 F (36.7 C) 97.9 F (36.6 C)    TempSrc: Oral  Oral Oral  Resp: 18  18 16   Height:      Weight:    229 lb 11.5 oz (104.2 kg)  SpO2: 97%  96% 95%    GEN- The patient is well appearing, alert and oriented x 3 today.   HEENT: normocephalic, atraumatic; sclera clear, conjunctiva pink; hearing intact; oropharynx clear; neck supple, no JVP Lungs- Clear to ausculation bilaterally, normal work of breathing.  No wheezes, rales, rhonchi Heart- Regular rate and rhythm, no murmurs, rubs or gallops, PMI not laterally displaced GI- soft, non-tender, non-distended, bowel sounds present, no hepatosplenomegaly Extremities- no clubbing, cyanosis, or edema MS- no significant deformity or atrophy Skin- warm and dry, no rash or lesion, left chest without hematoma/ecchymosis Psych- euthymic mood, full affect Neuro- no gross deficits   Labs:   Lab Results  Component Value Date   WBC 5.1 04/03/2015   HGB 13.0 04/03/2015   HCT 38.2* 04/03/2015   MCV 91.0 04/03/2015   PLT 169 04/03/2015   No results for input(s): NA, K, CL, CO2, BUN, CREATININE, CALCIUM, PROT, BILITOT, ALKPHOS, ALT, AST, GLUCOSE in the last 168 hours.  Invalid input(s): LABALBU  Discharge Medications:    Medication List    TAKE these medications        COQ10 PO  Take 1 capsule by mouth daily as needed (takes occasionally).     metoprolol succinate 50 MG 24 hr tablet  Commonly known as:  TOPROL XL  Take 1 tablet (50 mg total) by mouth daily.  Take with or immediately following a meal.     multivitamin with minerals Tabs tablet  Take 1 tablet by mouth daily as needed (takes occasionally).     nitroGLYCERIN 0.4 MG SL tablet  Commonly known as:  NITROSTAT  Place 0.4 mg under the tongue daily as needed for chest pain. Chest pain     Omega-3 1000 MG Caps  Take 1 g by mouth daily as needed (takes occasionally).     ramipril 2.5 MG capsule  Commonly known as:  ALTACE  Take 2.5 mg by mouth daily.     torsemide 20 MG tablet  Commonly known as:  DEMADEX   Take 20 mg by mouth daily.        Disposition:  Home Discharge Instructions    Diet - low sodium heart healthy    Complete by:  As directed      Increase activity slowly    Complete by:  As directed           Follow-up Information    Follow up with [facility] On 04/25/2015.   Specialty:  Cardiology   Why:  10:00AM, wound check   Contact information:   [address], [place] [phone]      Follow up with Lacey Wallman Meredith Leeds, MD On 07/11/2015.   Specialty:  Cardiology   Why:  10:45AM   Contact information:   [place] [address] [phone]       Duration of Discharge Encounter: Greater than 30 minutes including physician time.  Venetia Night, PA-C 04/11/2015 8:56 AM    I have seen and examined this patient with Tommye Standard.  Agree with above, note added to reflect my findings.  On exam, regular rhythm, no murmurs, lungs clear.  Had RV lead revision for decreased R wave amplitude.  Plan for discharge today.  CXR this AM without pneumothorax, interrogation without abnormality.  [name] discharge today with follow up in device clinic in 10 days.      Brandon Dayal M. Lyah Millirons MD 04/11/2015 11:17 AM

## 2015-04-11 NOTE — Progress Notes (Signed)
Discharged to home with family office visits in place teaching done  

## 2015-04-24 ENCOUNTER — Ambulatory Visit: Payer: Medicare Other

## 2015-04-24 DIAGNOSIS — H3521 Other non-diabetic proliferative retinopathy, right eye: Secondary | ICD-10-CM | POA: Insufficient documentation

## 2015-04-25 ENCOUNTER — Encounter: Payer: Self-pay | Admitting: Cardiology

## 2015-04-25 ENCOUNTER — Ambulatory Visit (INDEPENDENT_AMBULATORY_CARE_PROVIDER_SITE_OTHER): Payer: Medicare Other | Admitting: *Deleted

## 2015-04-25 DIAGNOSIS — R55 Syncope and collapse: Secondary | ICD-10-CM | POA: Diagnosis not present

## 2015-04-25 DIAGNOSIS — Z95818 Presence of other cardiac implants and grafts: Secondary | ICD-10-CM

## 2015-04-25 DIAGNOSIS — Z959 Presence of cardiac and vascular implant and graft, unspecified: Secondary | ICD-10-CM

## 2015-04-25 LAB — CUP PACEART INCLINIC DEVICE CHECK
Battery Voltage: 3.07 V
Brady Statistic RA Percent Paced: 28 %
Date Time Interrogation Session: 20170414103929
Implantable Lead Implant Date: 20170221
Implantable Lead Location: 753860
Lead Channel Impedance Value: 450 Ohm
Lead Channel Pacing Threshold Amplitude: 0.5 V
Lead Channel Pacing Threshold Amplitude: 1 V
Lead Channel Pacing Threshold Amplitude: 1 V
Lead Channel Pacing Threshold Pulse Width: 0.5 ms
Lead Channel Sensing Intrinsic Amplitude: 12 mV
Lead Channel Sensing Intrinsic Amplitude: 4.3 mV
Lead Channel Setting Pacing Amplitude: 1.375
Lead Channel Setting Pacing Pulse Width: 0.5 ms
MDC IDC LEAD IMPLANT DT: 20170221
MDC IDC LEAD LOCATION: 753859
MDC IDC MSMT BATTERY REMAINING LONGEVITY: 133.2
MDC IDC MSMT LEADCHNL RA PACING THRESHOLD AMPLITUDE: 0.5 V
MDC IDC MSMT LEADCHNL RA PACING THRESHOLD PULSEWIDTH: 0.5 ms
MDC IDC MSMT LEADCHNL RV IMPEDANCE VALUE: 437.5 Ohm
MDC IDC MSMT LEADCHNL RV PACING THRESHOLD PULSEWIDTH: 0.5 ms
MDC IDC MSMT LEADCHNL RV PACING THRESHOLD PULSEWIDTH: 0.5 ms
MDC IDC SET LEADCHNL RV PACING AMPLITUDE: 1 V
MDC IDC SET LEADCHNL RV SENSING SENSITIVITY: 0.7 mV
MDC IDC STAT BRADY RV PERCENT PACED: 0.74 %
Pulse Gen Model: 2240
Pulse Gen Serial Number: 7877392

## 2015-04-25 NOTE — Progress Notes (Signed)
Wound check appointment. Steri-strips removed. Wound without redness or edema. Incision edges approximated, wound well healed. Normal device function. Thresholds, sensing, and impedances consistent with implant measurements. Device programmed with auto capture on for extra safety margin until 3 month visit. Histogram distribution appropriate for patient and level of activity. 123 mode switches- longest 12 mins, atrial rate of 155bpm. No high ventricular rates noted. Patient educated about wound care, arm mobility, lifting restrictions. ROV with WC 07/11/15.

## 2015-04-28 ENCOUNTER — Ambulatory Visit: Payer: Medicare Other

## 2015-05-09 DIAGNOSIS — F321 Major depressive disorder, single episode, moderate: Secondary | ICD-10-CM | POA: Insufficient documentation

## 2015-05-16 ENCOUNTER — Encounter: Payer: Self-pay | Admitting: Cardiology

## 2015-06-03 ENCOUNTER — Encounter: Payer: Medicare Other | Admitting: Cardiology

## 2015-06-24 ENCOUNTER — Encounter: Payer: Self-pay | Admitting: Cardiology

## 2015-07-01 NOTE — Progress Notes (Signed)
Electrophysiology Office Note   Date:  07/02/2015   ID:  [name], DOB 21-Jan-1940, MRN [id number]  PCP:  Maris Berger, MD Primary Electrophysiologist:  Constance Haw, MD    Chief Complaint  Patient presents with  . Pacemaker Check     History of Present Illness: [name] is a 75 y.o. male who presents today for electrophysiology evaluation.   He presented to the hospital on 2/20 with syncope.  He underwent implantation of a SJM dual chamber pacemaker.  Since that time, he has had a device check which showed R waves that had decreased from 17.9 to 3.7 mV.  Lead revision performed on 04/10/15. He is feeling well other than some shortness of breath. He says that when he walks on flat ground he does get shortness of breath and has to stop walking at times. He says that he take the trash out and coming back up to the house of a very small hill he also gets short of breath. Otherwise he is doing well without major complaint.  Today, he denies symptoms of palpitations, chest pain, orthopnea, PND, lower extremity edema, claudication, dizziness, presyncope, syncope, bleeding, or neurologic sequela. The patient is tolerating medications without difficulties and is otherwise without complaint today.     Past Medical History  Diagnosis Date  . Hypertension   . CHF (congestive heart failure) ([place])   . Heart attack [facility]) 'many years ago'   Past Surgical History  Procedure Laterality Date  . Cardiac catheterization    . Ep implantable device N/A 03/04/2015    Procedure: Pacemaker Implant;  Surgeon: Zissel Biederman Meredith Leeds, MD;  Location: [place] [facility];  Service: Cardiovascular;  Laterality: N/A;  . Ep implantable device N/A 04/10/2015    Procedure: PPM Lead Revision/Repair;  Surgeon: Shekina Cordell Meredith Leeds, MD;  Location: [place] [facility];  Service: Cardiovascular;  Laterality: N/A;     Current Outpatient Prescriptions  Medication Sig Dispense Refill  . Coenzyme Q10  (COQ10 PO) Take 1 capsule by mouth daily as needed (takes occasionally).    . metoprolol succinate (TOPROL XL) 50 MG 24 hr tablet Take 1 tablet (50 mg total) by mouth daily. Take with or immediately following a meal. 30 tablet 1  . Multiple Vitamin (MULTIVITAMIN WITH MINERALS) TABS tablet Take 1 tablet by mouth daily as needed (takes occasionally).     . nitroGLYCERIN (NITROSTAT) 0.4 MG SL tablet Place 0.4 mg under the tongue daily as needed for chest pain. Chest pain    . Omega-3 1000 MG CAPS Take 1 g by mouth daily as needed (takes occasionally).     . ramipril (ALTACE) 2.5 MG capsule Take 2.5 mg by mouth daily.    Marland Kitchen torsemide (DEMADEX) 20 MG tablet Take 20 mg by mouth daily.     No current facility-administered medications for this visit.    Allergies:   Novocain; Rosuvastatin calcium; and Statins   Social History:  The patient  reports that he has quit smoking. He does not have any smokeless tobacco history on file. He reports that he does not drink alcohol or use illicit drugs.   Family History:  The patient's family history includes Diabetes in his brother and sister.    ROS:  Please see the history of present illness.   Otherwise, review of systems is positive for DOE.   All other systems are reviewed and negative.    PHYSICAL EXAM: VS:  BP 130/88 mmHg  Pulse 68  Ht 6' (1.829  m)  Wt 224 lb 3.2 oz (101.696 kg)  BMI 30.40 kg/m2 , BMI Body mass index is 30.4 kg/(m^2). GEN: Well nourished, well developed, in no acute distress HEENT: normal Neck: no JVD, carotid bruits, or masses Cardiac: RRR; no murmurs, rubs, or gallops,no edema  Respiratory:  clear to auscultation bilaterally, normal work of breathing GI: soft, nontender, nondistended, + BS MS: no deformity or atrophy Skin: warm and dry,  device pocket is well healed Neuro:  Strength and sensation are intact Psych: euthymic mood, full affect   Device interrogation is reviewed today in detail.  See PaceArt for  details.  ECG performed today shows sinus rhythm, inferior Q waves, 1 degree AV block  Recent Labs: 03/03/2015: B Natriuretic Peptide 17.6 03/14/2015: ALT 38 04/03/2015: BUN 10; Creat 1.26*; Hemoglobin 13.0; Platelets 169; Potassium 3.8; Sodium 139    Lipid Panel  No results found for: CHOL, TRIG, HDL, CHOLHDL, VLDL, LDLCALC, LDLDIRECT   Wt Readings from Last 3 Encounters:  07/02/15 224 lb 3.2 oz (101.696 kg)  04/11/15 229 lb 11.5 oz (104.2 kg)  03/25/15 228 lb (103.42 kg)      Other studies Reviewed: Additional studies/ records that were reviewed today include:  CXR 03/13/15 demonstrates:  Cardiomegaly without evidence of acute cardiopulmonary disease.  Unchanged left-sided dual lead pacemaker with leads overlying the regions of the right atrium and right ventricle.   ASSESSMENT AND PLAN:  1.  Syncope with intermittent heart block: Dual chamber pacemaker in place.  Had dropping R waves to 3.7 mV.  Recheck today shows R waves of 2.5-4 and threshold of 0.375.  Lead revision performed 04/10/15. Leads functioning appropriately without any major issues.  2. Shortness of breath: At this time, it is unclear the cause of her shortness of breath. We'll get an echocardiogram to evaluate cardiac causes.      Current medicines are reviewed at length with the patient today.   The patient does not have concerns regarding his medicines.  The following changes were made today:  none  Labs/ tests ordered today include:  No orders of the defined types were placed in this encounter.     Disposition:   FU with Gaytha Raybourn 9 months  Signed, Alverna Fawley Meredith Leeds, MD  07/02/2015 12:24 PM     [facility] [address] [place] [address] [phone] (office) [phone] (fax)

## 2015-07-02 ENCOUNTER — Encounter: Payer: Self-pay | Admitting: Cardiology

## 2015-07-02 ENCOUNTER — Ambulatory Visit (INDEPENDENT_AMBULATORY_CARE_PROVIDER_SITE_OTHER): Payer: Medicare Other | Admitting: Cardiology

## 2015-07-02 ENCOUNTER — Other Ambulatory Visit: Payer: Self-pay | Admitting: *Deleted

## 2015-07-02 VITALS — BP 130/88 | HR 68 | Ht 72.0 in | Wt 224.2 lb

## 2015-07-02 DIAGNOSIS — R0600 Dyspnea, unspecified: Secondary | ICD-10-CM

## 2015-07-02 DIAGNOSIS — Z950 Presence of cardiac pacemaker: Secondary | ICD-10-CM | POA: Diagnosis not present

## 2015-07-02 LAB — CUP PACEART INCLINIC DEVICE CHECK
Battery Remaining Longevity: 129.6
Brady Statistic RA Percent Paced: 49 %
Implantable Lead Implant Date: 20170221
Implantable Lead Location: 753859
Lead Channel Pacing Threshold Amplitude: 0.5 V
Lead Channel Pacing Threshold Amplitude: 0.5 V
Lead Channel Pacing Threshold Pulse Width: 0.5 ms
Lead Channel Pacing Threshold Pulse Width: 0.5 ms
Lead Channel Pacing Threshold Pulse Width: 0.5 ms
Lead Channel Sensing Intrinsic Amplitude: 12 mV
Lead Channel Sensing Intrinsic Amplitude: 3.8 mV
Lead Channel Setting Sensing Sensitivity: 0.7 mV
MDC IDC LEAD IMPLANT DT: 20170221
MDC IDC LEAD LOCATION: 753860
MDC IDC MSMT BATTERY VOLTAGE: 3.02 V
MDC IDC MSMT LEADCHNL RA IMPEDANCE VALUE: 437.5 Ohm
MDC IDC MSMT LEADCHNL RV IMPEDANCE VALUE: 487.5 Ohm
MDC IDC MSMT LEADCHNL RV PACING THRESHOLD AMPLITUDE: 0.75 V
MDC IDC MSMT LEADCHNL RV PACING THRESHOLD AMPLITUDE: 0.75 V
MDC IDC MSMT LEADCHNL RV PACING THRESHOLD PULSEWIDTH: 0.5 ms
MDC IDC PG SERIAL: 7877392
MDC IDC SESS DTM: 20170621124118
MDC IDC SET LEADCHNL RA PACING AMPLITUDE: 1.375
MDC IDC SET LEADCHNL RV PACING AMPLITUDE: 0.875
MDC IDC SET LEADCHNL RV PACING PULSEWIDTH: 0.5 ms
MDC IDC STAT BRADY RV PERCENT PACED: 0.78 %
Pulse Gen Model: 2240

## 2015-07-02 MED ORDER — METOPROLOL SUCCINATE ER 50 MG PO TB24: 50.0000 mg | ORAL_TABLET | Freq: Every day | ORAL | Status: DC

## 2015-07-02 NOTE — Patient Instructions (Signed)
Medication Instructions:  Your physician recommends that you continue on your current medications as directed. Please refer to the Current Medication list given to you today.  Resume Toprol 50 mg by mouth daily.    Labwork: none  Testing/Procedures: Your physician has requested that you have an echocardiogram. Echocardiography is a painless test that uses sound waves to create images of your heart. It provides your doctor with information about the size and shape of your heart and how well your heart's chambers and valves are working. This procedure takes approximately one hour. There are no restrictions for this procedure.    Follow-Up: Remote monitoring is used to monitor your Pacemaker of ICD from home. This monitoring reduces the number of office visits required to check your device to one time per year. It allows [place] to keep an eye on the functioning of your device to ensure it is working properly. You are scheduled for a device check from home on September 20,2017. You may send your transmission at any time that day. If you have a wireless device, the transmission will be sent automatically. After your physician reviews your transmission, you will receive a postcard with your next transmission date.  Your physician wants you to follow-up in: February 2017 You will receive a reminder letter in the mail two months in advance. If you don't receive a letter, please call our office to schedule the follow-up appointment.   Any Other Special Instructions Will Be Listed Below (If Applicable).     If you need a refill on your cardiac medications before your next appointment, please call your pharmacy.

## 2015-07-09 DIAGNOSIS — E039 Hypothyroidism, unspecified: Secondary | ICD-10-CM | POA: Insufficient documentation

## 2015-07-11 ENCOUNTER — Encounter: Payer: Medicare Other | Admitting: Cardiology

## 2015-08-12 ENCOUNTER — Other Ambulatory Visit (HOSPITAL_COMMUNITY): Payer: Medicare Other

## 2015-08-12 DIAGNOSIS — R0989 Other specified symptoms and signs involving the circulatory and respiratory systems: Secondary | ICD-10-CM

## 2015-09-08 ENCOUNTER — Telehealth: Payer: Self-pay | Admitting: Cardiology

## 2015-09-08 NOTE — Telephone Encounter (Signed)
New Message:   She would like pt to be scheduled for Cardiac Rehab in [place]. Their phone number is [phone].

## 2015-09-08 NOTE — Telephone Encounter (Signed)
Ok to speak w/ dtr.  Reports patient SOB just walking around the house.  Seems like he can't do anything.  This is an ongoing issue but seems to be worsening for the patient. Dtr aware I will look into this and discuss with Dr. Curt Bears and call her later this week with recommendation/s.  She thanks me for calling and helping.

## 2015-09-11 NOTE — Telephone Encounter (Signed)
Advised that patient needs to have echo done to evaluate possible cause to issues.   He was supposed to have one done but had to cancel secondary to wife's health condition at the time.  Echo rescheduled.

## 2015-09-25 ENCOUNTER — Other Ambulatory Visit (HOSPITAL_COMMUNITY): Payer: Medicare Other

## 2015-09-26 ENCOUNTER — Other Ambulatory Visit: Payer: Self-pay

## 2015-09-26 ENCOUNTER — Ambulatory Visit (HOSPITAL_COMMUNITY): Payer: Medicare Other | Attending: Cardiology

## 2015-09-26 ENCOUNTER — Encounter (INDEPENDENT_AMBULATORY_CARE_PROVIDER_SITE_OTHER): Payer: Self-pay

## 2015-09-26 DIAGNOSIS — R0600 Dyspnea, unspecified: Secondary | ICD-10-CM | POA: Insufficient documentation

## 2015-10-01 ENCOUNTER — Ambulatory Visit (INDEPENDENT_AMBULATORY_CARE_PROVIDER_SITE_OTHER): Payer: Medicare Other | Admitting: *Deleted

## 2015-10-01 ENCOUNTER — Telehealth: Payer: Self-pay | Admitting: Cardiology

## 2015-10-01 DIAGNOSIS — R55 Syncope and collapse: Secondary | ICD-10-CM

## 2015-10-01 DIAGNOSIS — Z950 Presence of cardiac pacemaker: Secondary | ICD-10-CM

## 2015-10-01 NOTE — Progress Notes (Signed)
Remote pacemaker transmission.   

## 2015-10-01 NOTE — Telephone Encounter (Signed)
Follow Up: ° ° ° °Returning your call from yesterday. °

## 2015-10-01 NOTE — Telephone Encounter (Signed)
Reviewed echo results with patient's wife (ok per DPR)  who verbalized understanding. Echo showed no major abnormalities.  Nothing to explain SOB. She states that patient is still very SOB and can't even walk through house w/o becoming SOB. She understands I will review this with Dr. Curt Bears and call her/pt w/ recommendations.

## 2015-10-01 NOTE — Telephone Encounter (Signed)
Follow Up:    Pt called back again,she left a different phone number for you to call.

## 2015-10-03 ENCOUNTER — Encounter: Payer: Self-pay | Admitting: Cardiology

## 2015-10-03 NOTE — Telephone Encounter (Signed)
lmtcb  (per Dr. Curt Bears - pulmonary referral for SOB)

## 2015-10-12 DIAGNOSIS — I639 Cerebral infarction, unspecified: Secondary | ICD-10-CM

## 2015-10-12 HISTORY — DX: Cerebral infarction, unspecified: I63.9

## 2015-10-15 ENCOUNTER — Telehealth: Payer: Self-pay | Admitting: Cardiology

## 2015-10-15 NOTE — Telephone Encounter (Signed)
Called and left message on pt's [id number] to c/b to discuss his SOB.  We unfortunately don't have a DPR on file to speak with the pt's daughter - only his wife.

## 2015-10-15 NOTE — Telephone Encounter (Signed)
[name] ( Daughter) is calling because [name] is having shortness of breath . Please call

## 2015-10-15 NOTE — Progress Notes (Signed)
Cardiology Office Note    Date:  10/16/2015   ID:  [name], DOB Oct 26, 1940, MRN [id number]  PCP:  Maris Berger, MD  Cardiologist:  Dr. Curt Bears previously Dr. Bettina Gavia in [place] but they would like to establish care here --> will set up with Dr. Irish Lack who will be doing his cath   CC: SOB  History of Present Illness:  [name] is a 75 y.o. 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   He has a history of CAD wtih remote stents x2 about 20 years ago, reportedly had cath about 4 years ago with what sounds PTCA (no reports in [facility]where that I can find). He had a normal echo and nuclear stress test in 01/2015.   He presented to [facility] in 02/2015 with syncope. He was found to have bradycardia with sinus arrest. The patient underwent implantation of a STJ dual chamber pacemaker. Since that time, he had a device check which showed R waves that had decreased from 17.9 to 3.7 mV. Lead revision performed on 04/10/15.  He saw Dr. Curt Bears on 07/02/15 and complained of SOB. An echocardiogram was ordered which showed normal LV function with no WMAs, mild LVH, G1DD, mildly dilated aortic root.   Today he presents to clinic for follow up. He has been having shortness of breath since pacemaker placement. But over the past three weeks he has been so short of breath with minimal activity. He has had to take take 3 SL NTG x3 weeks for chest pain with good relief. Before that he has had no chest pain. Chest pain comes out of nowhere and not necessarily related to exertion. No LE edema, orthopnea or PND. He has been sleeping a lot more lately. Having a lot of fatigue. Recently seen by PCP and thyroid and labs are good. No dizziness or syncope.   Past Medical History:  Diagnosis Date  . CHF (congestive heart failure) ([place])   . Heart attack 'many years ago'  . Hypertension   .  OSA (obstructive sleep apnea)     Past Surgical History:  Procedure Laterality Date  . CARDIAC CATHETERIZATION    . EP IMPLANTABLE DEVICE N/A 03/04/2015   Procedure: Pacemaker Implant;  Surgeon: Will Meredith Leeds, MD;  Location: [facility];  Service: Cardiovascular;  Laterality: N/A;  . EP IMPLANTABLE DEVICE N/A 04/10/2015   Procedure: PPM Lead Revision/Repair;  Surgeon: Will Meredith Leeds, MD;  Location: [facility];  Service: Cardiovascular;  Laterality: N/A;    Current Medications: Outpatient Medications Prior to Visit  Medication Sig Dispense Refill  . Coenzyme Q10 (COQ10 PO) Take 1 capsule by mouth daily as needed (takes occasionally).    . metoprolol succinate (TOPROL XL) 50 MG 24 hr tablet Take 1 tablet (50 mg total) by mouth daily. Take with or immediately following a meal. 90 tablet 3  . Multiple Vitamin (MULTIVITAMIN WITH MINERALS) TABS tablet Take 1 tablet by mouth daily as needed (takes occasionally).     . nitroGLYCERIN (NITROSTAT) 0.4 MG SL tablet Place 0.4 mg under the tongue daily as needed for chest pain. Chest pain    . Omega-3 1000 MG CAPS Take 1 g by mouth daily as needed (takes occasionally).     . ramipril (ALTACE) 2.5 MG capsule Take 2.5 mg by mouth daily.    Marland Kitchen torsemide (DEMADEX) 20 MG tablet Take  20 mg by mouth daily.     No facility-administered medications prior to visit.      Allergies:   Novocain [procaine]; Rosuvastatin calcium; and Statins   Social History   Social History  . Marital status: Married    Spouse name: N/A  . Number of children: N/A  . Years of education: N/A   Social History Main Topics  . Smoking status: Former Research scientist (life sciences)  . Smokeless tobacco: Never Used  . Alcohol use No  . Drug use: No  . Sexual activity: Not Asked   Other Topics Concern  . None   Social History Narrative  . None     Family History:  The patient's family history includes Diabetes in his brother and sister.     ROS:   Please see the  history of present illness.    ROS All other systems reviewed and are negative.   PHYSICAL EXAM:   VS:  BP 122/88   Pulse 75   Ht 6' (1.829 m)   Wt 230 lb 12.8 oz (104.7 kg)   SpO2 97%   BMI 31.30 kg/m    GEN: Well nourished, well developed, in no acute distress  HEENT: normal  Neck: no JVD, carotid bruits, or masses Cardiac: RRR; no murmurs, rubs, or gallops,no edema  Respiratory:  clear to auscultation bilaterally, normal work of breathing GI: soft, nontender, nondistended, + BS MS: no deformity or atrophy  Skin: warm and dry, no rash Neuro:  Alert and Oriented x 3, Strength and sensation are intact Psych: euthymic mood, full affect  Wt Readings from Last 3 Encounters:  10/16/15 230 lb 12.8 oz (104.7 kg)  07/02/15 224 lb 3.2 oz (101.7 kg)  04/11/15 229 lb 11.5 oz (104.2 kg)      Studies/Labs Reviewed:   EKG:  EKG is NOT ordered today.    Recent Labs: 03/03/2015: B Natriuretic Peptide 17.6 03/14/2015: ALT 38 04/03/2015: BUN 10; Creat 1.26; Hemoglobin 13.0; Platelets 169; Potassium 3.8; Sodium 139   Lipid Panel No results found for: CHOL, TRIG, HDL, CHOLHDL, VLDL, LDLCALC, LDLDIRECT  Additional studies/ records that were reviewed today include:  2D ECHO: 09/26/2015 LV EF: 55% -   60% Study Conclusions - Left ventricle: The cavity size was normal. Wall thickness was   increased in a pattern of mild LVH. Systolic function was normal.   The estimated ejection fraction was in the range of 55% to 60%.   Wall motion was normal; there were no regional wall motion   abnormalities. Doppler parameters are consistent with abnormal   left ventricular relaxation (grade 1 diastolic dysfunction). - Aortic root: The aortic root was mildly dilated. - Mitral valve: Calcified annulus Impressions - Normal LV systolic function; grade 1 diastolic dysfunction;   trace TR.  01/2015 IMPRESSION: 1. No reversible ischemia or infarction. 2. Normal left ventricular wall motion. 3. Left  ventricular ejection fraction 63% 4. Low risk stress test findings*.  ASSESSMENT & PLAN:   SSCP/DOE: he appears euvolemic and had a recent normal myoview in 01/2015. ECHO on 09/26/15 reassuring with normal LV function and no WMAs. He is now so SOB with minimal activity and having nitrate responsive chest pain. Will set him up for [facility] and get labs today.   I have reviewed the risks, indications, and alternatives to cardiac catheterization and possible angioplasty/stenting with the patient. Risks include but are not limited to bleeding, infection, vascular injury, stroke, myocardial infection, arrhythmia, kidney injury, radiation-related injury in the case of prolonged fluoroscopy  use, emergency cardiac surgery, and death. The patient understands the risks of serious complication is low (123456).   CAD s/p prior stent[facility]: continue ASA 81mg  daily and BB. He is intolerant to statins  HTN: BP well controlled currently  Chronic diastolic CHF: continue torsemide 20 mg daily   CHB s/p PPM: followed by Dr. Curt Bears   Medication Adjustments/Labs and Tests Ordered: Current medicines are reviewed at length with the patient today.  Concerns regarding medicines are outlined above.  Medication changes, Labs and Tests ordered today are listed in the Patient Instructions below. Patient Instructions  Medication Instructions:  Your physician recommends that you continue on your current medications as directed. Please refer to the Current Medication list given to you today.   Labwork: TODAY;  BMET, CBC W/DIFF, & PT/INR  Testing/Procedures: Your physician has requested that you have a cardiac catheterization. Cardiac catheterization is used to diagnose and/or treat various heart conditions. Doctors may recommend this procedure for a number of different reasons. The most common reason is to evaluate chest pain. Chest pain can be a symptom of coronary artery disease (CAD), and cardiac catheterization can show  whether plaque is narrowing or blocking your heart's arteries. This procedure is also used to evaluate the valves, as well as measure the blood flow and oxygen levels in different parts of your heart. For further information please visit HugeFiesta.[email or web address]. Please follow instruction sheet, as given.    Follow-Up: Your physician recommends that you schedule a follow-up appointment in: WILL BE SET UPON DISCHARGE   Any Other Special Instructions Will Be Listed Below (If Applicable).   Coronary Angiogram A coronary angiogram, also called coronary angiography, is an X-ray procedure used to look at the arteries in the heart. In this procedure, a dye (contrast dye) is injected through a long, hollow tube (catheter). The catheter is about the size of a piece of cooked spaghetti and is inserted through your groin, wrist, or arm. The dye is injected into each artery, and X-rays are then taken to show if there is a blockage in the arteries of your heart. LET [facility] CARE PROVIDER KNOW ABOUT:  Any allergies you have, including allergies to shellfish or contrast dye.   All medicines you are taking, including vitamins, herbs, eye drops, creams, and over-the-counter medicines.   Previous problems you or members of your family have had with the use of anesthetics.   Any blood disorders you have.   Previous surgeries you have had.  History of kidney problems or failure.   Other medical conditions you have. RISKS AND COMPLICATIONS  Generally, a coronary angiogram is a safe procedure. However, problems can occur and include:  Allergic reaction to the dye.  Bleeding from the access site or other locations.  Kidney injury, especially in people with impaired kidney function.  Stroke (rare).  Heart attack (rare). BEFORE THE PROCEDURE   Do not eat or drink anything after midnight the night before the procedure or as directed by your health care provider.   Ask your health care  provider about changing or stopping your regular medicines. This is especially important if you are taking diabetes medicines or blood thinners. PROCEDURE  You may be given a medicine to help you relax (sedative) before the procedure. This medicine is given through an intravenous (IV) access tube that is inserted into one of your veins.   The area where the catheter will be inserted will be washed and shaved. This is usually done in the groin  but may be done in the fold of your arm (near your elbow) or in the wrist.   A medicine will be given to numb the area where the catheter will be inserted (local anesthetic).   The health care provider will insert the catheter into an artery. The catheter will be guided by using a special type of X-ray (fluoroscopy) of the blood vessel being examined.   A special dye will then be injected into the catheter, and X-rays will be taken. The dye will help to show where any narrowing or blockages are located in the heart arteries.  AFTER THE PROCEDURE   If the procedure is done through the leg, you will be kept in bed lying flat for several hours. You will be instructed to not bend or cross your legs.  The insertion site will be checked frequently.   The pulse in your feet or wrist will be checked frequently.   Additional blood tests, X-rays, and an electrocardiogram may be done.    This information is not intended to replace advice given to you by your health care provider. Make sure you discuss any questions you have with your health care provider.   Document Released: 07/04/2002 Document Revised: 01/18/2014 Document Reviewed: 05/22/2012 Elsevier Interactive Patient Education [employer].    If you need a refill on your cardiac medications before your next appointment, please call your pharmacy.      Signed, Angelena Form, PA-C  10/16/2015 2:41 PM    [facility] [address], [place], [place]   [address] Phone: [phone]; Fax: [phone]

## 2015-10-15 NOTE — Telephone Encounter (Signed)
No return call from message left a home #.  Called mobile # listed and lm to cb to discuss.

## 2015-10-16 ENCOUNTER — Encounter: Payer: Self-pay | Admitting: *Deleted

## 2015-10-16 ENCOUNTER — Ambulatory Visit (INDEPENDENT_AMBULATORY_CARE_PROVIDER_SITE_OTHER): Payer: Medicare Other | Admitting: Physician Assistant

## 2015-10-16 VITALS — BP 122/88 | HR 75 | Ht 72.0 in | Wt 230.8 lb

## 2015-10-16 DIAGNOSIS — I10 Essential (primary) hypertension: Secondary | ICD-10-CM

## 2015-10-16 DIAGNOSIS — Z950 Presence of cardiac pacemaker: Secondary | ICD-10-CM

## 2015-10-16 DIAGNOSIS — R0602 Shortness of breath: Secondary | ICD-10-CM | POA: Diagnosis not present

## 2015-10-16 DIAGNOSIS — I442 Atrioventricular block, complete: Secondary | ICD-10-CM

## 2015-10-16 DIAGNOSIS — E785 Hyperlipidemia, unspecified: Secondary | ICD-10-CM

## 2015-10-16 DIAGNOSIS — I2510 Atherosclerotic heart disease of native coronary artery without angina pectoris: Secondary | ICD-10-CM

## 2015-10-16 DIAGNOSIS — I5032 Chronic diastolic (congestive) heart failure: Secondary | ICD-10-CM

## 2015-10-16 LAB — CBC WITH DIFFERENTIAL/PLATELET
BASOS PCT: 0 %
Basophils Absolute: 0 cells/uL (ref 0–200)
EOS PCT: 2 %
Eosinophils Absolute: 94 cells/uL (ref 15–500)
HCT: 40.9 % (ref 38.5–50.0)
HEMOGLOBIN: 14.1 g/dL (ref 13.2–17.1)
LYMPHS ABS: 1645 {cells}/uL (ref 850–3900)
Lymphocytes Relative: 35 %
MCH: 32.6 pg (ref 27.0–33.0)
MCHC: 34.5 g/dL (ref 32.0–36.0)
MCV: 94.5 fL (ref 80.0–100.0)
MPV: 9.1 fL (ref 7.5–12.5)
Monocytes Absolute: 517 cells/uL (ref 200–950)
Monocytes Relative: 11 %
NEUTROS ABS: 2444 {cells}/uL (ref 1500–7800)
Neutrophils Relative %: 52 %
Platelets: 174 10*3/uL (ref 140–400)
RBC: 4.33 MIL/uL (ref 4.20–5.80)
RDW: 13.7 % (ref 11.0–15.0)
WBC: 4.7 10*3/uL (ref 3.8–10.8)

## 2015-10-16 NOTE — Telephone Encounter (Signed)
Pt was seen in clinic today 10/5 and has been scheduled for cardiac cath.

## 2015-10-16 NOTE — Telephone Encounter (Signed)
Pt was seen by Nell Range, PA and was scheduled for outpt cardiac cath 10/17/15.

## 2015-10-16 NOTE — Patient Instructions (Signed)
Medication Instructions:  Your physician recommends that you continue on your current medications as directed. Please refer to the Current Medication list given to you today.   Labwork: TODAY;  BMET, CBC W/DIFF, & PT/INR  Testing/Procedures: Your physician has requested that you have a cardiac catheterization. Cardiac catheterization is used to diagnose and/or treat various heart conditions. Doctors may recommend this procedure for a number of different reasons. The most common reason is to evaluate chest pain. Chest pain can be a symptom of coronary artery disease (CAD), and cardiac catheterization can show whether plaque is narrowing or blocking your heart's arteries. This procedure is also used to evaluate the valves, as well as measure the blood flow and oxygen levels in different parts of your heart. For further information please visit HugeFiesta.[email or web address]. Please follow instruction sheet, as given.    Follow-Up: Your physician recommends that you schedule a follow-up appointment in: WILL BE SET UPON DISCHARGE   Any Other Special Instructions Will Be Listed Below (If Applicable).   Coronary Angiogram A coronary angiogram, also called coronary angiography, is an X-ray procedure used to look at the arteries in the heart. In this procedure, a dye (contrast dye) is injected through a long, hollow tube (catheter). The catheter is about the size of a piece of cooked spaghetti and is inserted through your groin, wrist, or arm. The dye is injected into each artery, and X-rays are then taken to show if there is a blockage in the arteries of your heart. LET [facility] CARE PROVIDER KNOW ABOUT:  Any allergies you have, including allergies to shellfish or contrast dye.   All medicines you are taking, including vitamins, herbs, eye drops, creams, and over-the-counter medicines.   Previous problems you or members of your family have had with the use of anesthetics.   Any blood disorders you  have.   Previous surgeries you have had.  History of kidney problems or failure.   Other medical conditions you have. RISKS AND COMPLICATIONS  Generally, a coronary angiogram is a safe procedure. However, problems can occur and include:  Allergic reaction to the dye.  Bleeding from the access site or other locations.  Kidney injury, especially in people with impaired kidney function.  Stroke (rare).  Heart attack (rare). BEFORE THE PROCEDURE   Do not eat or drink anything after midnight the night before the procedure or as directed by your health care provider.   Ask your health care provider about changing or stopping your regular medicines. This is especially important if you are taking diabetes medicines or blood thinners. PROCEDURE  You may be given a medicine to help you relax (sedative) before the procedure. This medicine is given through an intravenous (IV) access tube that is inserted into one of your veins.   The area where the catheter will be inserted will be washed and shaved. This is usually done in the groin but may be done in the fold of your arm (near your elbow) or in the wrist.   A medicine will be given to numb the area where the catheter will be inserted (local anesthetic).   The health care provider will insert the catheter into an artery. The catheter will be guided by using a special type of X-ray (fluoroscopy) of the blood vessel being examined.   A special dye will then be injected into the catheter, and X-rays will be taken. The dye will help to show where any narrowing or blockages are located in the heart arteries.  AFTER THE PROCEDURE   If the procedure is done through the leg, you will be kept in bed lying flat for several hours. You will be instructed to not bend or cross your legs.  The insertion site will be checked frequently.   The pulse in your feet or wrist will be checked frequently.   Additional blood tests, X-rays, and an  electrocardiogram may be done.    This information is not intended to replace advice given to you by your health care provider. Make sure you discuss any questions you have with your health care provider.   Document Released: 07/04/2002 Document Revised: 01/18/2014 Document Reviewed: 05/22/2012 Elsevier Interactive Patient Education [employer].    If you need a refill on your cardiac medications before your next appointment, please call your pharmacy.

## 2015-10-17 ENCOUNTER — Ambulatory Visit (HOSPITAL_COMMUNITY)
Admission: RE | Admit: 2015-10-17 | Discharge: 2015-10-17 | Disposition: A | Discharge status: Home / Self Care | Payer: Medicare Other | Source: Ambulatory Visit | Attending: Interventional Cardiology | Admitting: Interventional Cardiology

## 2015-10-17 ENCOUNTER — Encounter (HOSPITAL_COMMUNITY)
Admission: RE | Disposition: A | Discharge status: Home / Self Care | Payer: Self-pay | Source: Ambulatory Visit | Attending: Interventional Cardiology

## 2015-10-17 DIAGNOSIS — Z87891 Personal history of nicotine dependence: Secondary | ICD-10-CM | POA: Insufficient documentation

## 2015-10-17 DIAGNOSIS — I110 Hypertensive heart disease with heart failure: Secondary | ICD-10-CM | POA: Insufficient documentation

## 2015-10-17 DIAGNOSIS — G4733 Obstructive sleep apnea (adult) (pediatric): Secondary | ICD-10-CM | POA: Insufficient documentation

## 2015-10-17 DIAGNOSIS — I5032 Chronic diastolic (congestive) heart failure: Secondary | ICD-10-CM | POA: Diagnosis not present

## 2015-10-17 DIAGNOSIS — I2583 Coronary atherosclerosis due to lipid rich plaque: Secondary | ICD-10-CM | POA: Insufficient documentation

## 2015-10-17 DIAGNOSIS — E669 Obesity, unspecified: Secondary | ICD-10-CM | POA: Diagnosis not present

## 2015-10-17 DIAGNOSIS — I252 Old myocardial infarction: Secondary | ICD-10-CM | POA: Insufficient documentation

## 2015-10-17 DIAGNOSIS — Y712 Prosthetic and other implants, materials and accessory cardiovascular devices associated with adverse incidents: Secondary | ICD-10-CM | POA: Insufficient documentation

## 2015-10-17 DIAGNOSIS — Z6831 Body mass index (BMI) 31.0-31.9, adult: Secondary | ICD-10-CM | POA: Insufficient documentation

## 2015-10-17 DIAGNOSIS — Z833 Family history of diabetes mellitus: Secondary | ICD-10-CM | POA: Diagnosis not present

## 2015-10-17 DIAGNOSIS — I25119 Atherosclerotic heart disease of native coronary artery with unspecified angina pectoris: Secondary | ICD-10-CM | POA: Insufficient documentation

## 2015-10-17 DIAGNOSIS — Z7982 Long term (current) use of aspirin: Secondary | ICD-10-CM | POA: Diagnosis not present

## 2015-10-17 DIAGNOSIS — T82855A Stenosis of coronary artery stent, initial encounter: Secondary | ICD-10-CM | POA: Insufficient documentation

## 2015-10-17 DIAGNOSIS — I209 Angina pectoris, unspecified: Secondary | ICD-10-CM | POA: Diagnosis present

## 2015-10-17 DIAGNOSIS — R0609 Other forms of dyspnea: Secondary | ICD-10-CM

## 2015-10-17 DIAGNOSIS — Z950 Presence of cardiac pacemaker: Secondary | ICD-10-CM | POA: Diagnosis not present

## 2015-10-17 DIAGNOSIS — R0602 Shortness of breath: Secondary | ICD-10-CM

## 2015-10-17 HISTORY — PX: CARDIAC CATHETERIZATION: SHX172

## 2015-10-17 LAB — BASIC METABOLIC PANEL
BUN: 12 mg/dL (ref 7–25)
CALCIUM: 9.4 mg/dL (ref 8.6–10.3)
CO2: 27 mmol/L (ref 20–31)
Chloride: 100 mmol/L (ref 98–110)
Creat: 1.37 mg/dL — ABNORMAL HIGH (ref 0.70–1.18)
Glucose, Bld: 190 mg/dL — ABNORMAL HIGH (ref 65–99)
Potassium: 3.9 mmol/L (ref 3.5–5.3)
SODIUM: 137 mmol/L (ref 135–146)

## 2015-10-17 LAB — PROTIME-INR
INR: 1.1
PROTHROMBIN TIME: 11.2 s (ref 9.0–11.5)

## 2015-10-17 SURGERY — LEFT HEART CATH AND CORONARY ANGIOGRAPHY
Anesthesia: LOCAL

## 2015-10-17 MED ORDER — LIDOCAINE HCL (PF) 1 % IJ SOLN
INTRAMUSCULAR | Status: DC | PRN
  Administered 2015-10-17: 18 mL
  Administered 2015-10-17: 2 mL

## 2015-10-17 MED ORDER — SODIUM CHLORIDE 0.9% FLUSH: 3.0000 mL | Freq: Two times a day (BID) | INTRAVENOUS | Status: DC

## 2015-10-17 MED ORDER — SODIUM CHLORIDE 0.9 % WEIGHT BASED INFUSION: 1.0000 mL/kg/h | INTRAVENOUS | Status: DC

## 2015-10-17 MED ORDER — SODIUM CHLORIDE 0.9 % IV SOLN: 250.0000 mL | INTRAVENOUS | Status: DC | PRN

## 2015-10-17 MED ORDER — FENTANYL CITRATE (PF) 100 MCG/2ML IJ SOLN
INTRAMUSCULAR | Status: DC | PRN
  Administered 2015-10-17: 25 ug via INTRAVENOUS

## 2015-10-17 MED ORDER — LIDOCAINE HCL (PF) 1 % IJ SOLN
INTRAMUSCULAR | Status: AC
  Filled 2015-10-17: qty 30

## 2015-10-17 MED ORDER — SODIUM CHLORIDE 0.9% FLUSH
3.0000 mL | INTRAVENOUS | Status: DC | PRN
Start: 2015-10-17 — End: 2015-10-17

## 2015-10-17 MED ORDER — FENTANYL CITRATE (PF) 100 MCG/2ML IJ SOLN
INTRAMUSCULAR | Status: AC
  Filled 2015-10-17: qty 2

## 2015-10-17 MED ORDER — HEPARIN SODIUM (PORCINE) 1000 UNIT/ML IJ SOLN
INTRAMUSCULAR | Status: AC
  Filled 2015-10-17: qty 1

## 2015-10-17 MED ORDER — VERAPAMIL HCL 2.5 MG/ML IV SOLN
INTRAVENOUS | Status: AC
  Filled 2015-10-17: qty 2

## 2015-10-17 MED ORDER — MIDAZOLAM HCL 2 MG/2ML IJ SOLN
INTRAMUSCULAR | Status: DC | PRN
  Administered 2015-10-17: 1 mg via INTRAVENOUS

## 2015-10-17 MED ORDER — SODIUM CHLORIDE 0.9% FLUSH: 3.0000 mL | INTRAVENOUS | Status: DC | PRN

## 2015-10-17 MED ORDER — HEPARIN (PORCINE) IN NACL 2-0.9 UNIT/ML-% IJ SOLN
INTRAMUSCULAR | Status: DC | PRN
  Administered 2015-10-17: 1000 mL

## 2015-10-17 MED ORDER — IOPAMIDOL (ISOVUE-370) INJECTION 76%
INTRAVENOUS | Status: AC
  Filled 2015-10-17: qty 100

## 2015-10-17 MED ORDER — HEPARIN (PORCINE) IN NACL 2-0.9 UNIT/ML-% IJ SOLN
INTRAMUSCULAR | Status: AC
  Filled 2015-10-17: qty 1000

## 2015-10-17 MED ORDER — ASPIRIN 81 MG PO CHEW: 81.0000 mg | CHEWABLE_TABLET | ORAL | Status: DC

## 2015-10-17 MED ORDER — ISOSORBIDE MONONITRATE ER 30 MG PO TB24: 30.0000 mg | ORAL_TABLET | Freq: Every day | ORAL | 11 refills | Status: DC

## 2015-10-17 MED ORDER — IOPAMIDOL (ISOVUE-370) INJECTION 76%
INTRAVENOUS | Status: DC | PRN
  Administered 2015-10-17: 70 mL via INTRA_ARTERIAL

## 2015-10-17 MED ORDER — SODIUM CHLORIDE 0.9 % WEIGHT BASED INFUSION
3.0000 mL/kg/h | INTRAVENOUS | Status: DC
  Administered 2015-10-17: 3 mL/kg/h via INTRAVENOUS

## 2015-10-17 MED ORDER — MIDAZOLAM HCL 2 MG/2ML IJ SOLN
INTRAMUSCULAR | Status: AC
  Filled 2015-10-17: qty 2

## 2015-10-17 SURGICAL SUPPLY — 9 items
CATH INFINITI 5FR MULTPACK ANG (CATHETERS) ×2 IMPLANT
GLIDESHEATH SLEND SS 6F .021 (SHEATH) ×2 IMPLANT
KIT HEART LEFT (KITS) ×2 IMPLANT
PACK CARDIAC CATHETERIZATION (CUSTOM PROCEDURE TRAY) ×2 IMPLANT
SHEATH FAST CATH BRACH 5F 5CM (SHEATH) ×2 IMPLANT
SHEATH PINNACLE 5F 10CM (SHEATH) ×2 IMPLANT
TRANSDUCER W/STOPCOCK (MISCELLANEOUS) ×2 IMPLANT
TUBING CIL FLEX 10 FLL-RA (TUBING) ×2 IMPLANT
WIRE EMERALD 3MM-J .035X150CM (WIRE) ×2 IMPLANT

## 2015-10-17 NOTE — Interval H&P Note (Signed)
Cath Lab Visit (complete for each Cath Lab visit)  Clinical Evaluation Leading to the Procedure:   ACS: No.  Non-ACS:    Anginal Classification: CCS III  Anti-ischemic medical therapy: Minimal Therapy (1 class of medications)  Non-Invasive Test Results: No non-invasive testing performed  Prior CABG: No previous CABG      History and Physical Interval Note:  10/17/2015 2:55 PM  [name]  has presented today for surgery, with the diagnosis of sob  The various methods of treatment have been discussed with the patient and family. After consideration of risks, benefits and other options for treatment, the patient has consented to  Procedure(s): Left Heart Cath and Coronary Angiography (N/A) as a surgical intervention .  The patient's history has been reviewed, patient examined, no change in status, stable for surgery.  I have reviewed the patient's chart and labs.  Questions were answered to the patient's satisfaction.     Larae Grooms

## 2015-10-17 NOTE — H&P (View-Only) (Signed)
Cardiology Office Note    Date:  10/16/2015   ID:  [name], DOB 1940/12/28, MRN [id number]  PCP:  Maris Berger, MD  Cardiologist:  Dr. Curt Bears previously Dr. Bettina Gavia in [place] but they would like to establish care here --> will set up with Dr. Irish Lack who will be doing his cath   CC: SOB  History of Present Illness:  [name] is a 75 y.o. 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   He has a history of CAD wtih remote stents x2 about 20 years ago, reportedly had cath about 4 years ago with what sounds PTCA (no reports in [facility]where that I can find). He had a normal echo and nuclear stress test in 01/2015.   He presented to [facility] in 02/2015 with syncope. He was found to have bradycardia with sinus arrest. The patient underwent implantation of a STJ dual chamber pacemaker. Since that time, he had a device check which showed R waves that had decreased from 17.9 to 3.7 mV. Lead revision performed on 04/10/15.  He saw Dr. Curt Bears on 07/02/15 and complained of SOB. An echocardiogram was ordered which showed normal LV function with no WMAs, mild LVH, G1DD, mildly dilated aortic root.   Today he presents to clinic for follow up. He has been having shortness of breath since pacemaker placement. But over the past three weeks he has been so short of breath with minimal activity. He has had to take take 3 SL NTG x3 weeks for chest pain with good relief. Before that he has had no chest pain. Chest pain comes out of nowhere and not necessarily related to exertion. No LE edema, orthopnea or PND. He has been sleeping a lot more lately. Having a lot of fatigue. Recently seen by PCP and thyroid and labs are good. No dizziness or syncope.   Past Medical History:  Diagnosis Date  . CHF (congestive heart failure) ([place])   . Heart attack 'many years ago'  . Hypertension   .  OSA (obstructive sleep apnea)     Past Surgical History:  Procedure Laterality Date  . CARDIAC CATHETERIZATION    . EP IMPLANTABLE DEVICE N/A 03/04/2015   Procedure: Pacemaker Implant;  Surgeon: Will Meredith Leeds, MD;  Location: [facility];  Service: Cardiovascular;  Laterality: N/A;  . EP IMPLANTABLE DEVICE N/A 04/10/2015   Procedure: PPM Lead Revision/Repair;  Surgeon: Will Meredith Leeds, MD;  Location: [facility];  Service: Cardiovascular;  Laterality: N/A;    Current Medications: Outpatient Medications Prior to Visit  Medication Sig Dispense Refill  . Coenzyme Q10 (COQ10 PO) Take 1 capsule by mouth daily as needed (takes occasionally).    . metoprolol succinate (TOPROL XL) 50 MG 24 hr tablet Take 1 tablet (50 mg total) by mouth daily. Take with or immediately following a meal. 90 tablet 3  . Multiple Vitamin (MULTIVITAMIN WITH MINERALS) TABS tablet Take 1 tablet by mouth daily as needed (takes occasionally).     . nitroGLYCERIN (NITROSTAT) 0.4 MG SL tablet Place 0.4 mg under the tongue daily as needed for chest pain. Chest pain    . Omega-3 1000 MG CAPS Take 1 g by mouth daily as needed (takes occasionally).     . ramipril (ALTACE) 2.5 MG capsule Take 2.5 mg by mouth daily.    Marland Kitchen torsemide (DEMADEX) 20 MG tablet Take  20 mg by mouth daily.     No facility-administered medications prior to visit.      Allergies:   Novocain [procaine]; Rosuvastatin calcium; and Statins   Social History   Social History  . Marital status: Married    Spouse name: N/A  . Number of children: N/A  . Years of education: N/A   Social History Main Topics  . Smoking status: Former Research scientist (life sciences)  . Smokeless tobacco: Never Used  . Alcohol use No  . Drug use: No  . Sexual activity: Not Asked   Other Topics Concern  . None   Social History Narrative  . None     Family History:  The patient's family history includes Diabetes in his brother and sister.     ROS:   Please see the  history of present illness.    ROS All other systems reviewed and are negative.   PHYSICAL EXAM:   VS:  BP 122/88   Pulse 75   Ht 6' (1.829 m)   Wt 230 lb 12.8 oz (104.7 kg)   SpO2 97%   BMI 31.30 kg/m    GEN: Well nourished, well developed, in no acute distress  HEENT: normal  Neck: no JVD, carotid bruits, or masses Cardiac: RRR; no murmurs, rubs, or gallops,no edema  Respiratory:  clear to auscultation bilaterally, normal work of breathing GI: soft, nontender, nondistended, + BS MS: no deformity or atrophy  Skin: warm and dry, no rash Neuro:  Alert and Oriented x 3, Strength and sensation are intact Psych: euthymic mood, full affect  Wt Readings from Last 3 Encounters:  10/16/15 230 lb 12.8 oz (104.7 kg)  07/02/15 224 lb 3.2 oz (101.7 kg)  04/11/15 229 lb 11.5 oz (104.2 kg)      Studies/Labs Reviewed:   EKG:  EKG is NOT ordered today.    Recent Labs: 03/03/2015: B Natriuretic Peptide 17.6 03/14/2015: ALT 38 04/03/2015: BUN 10; Creat 1.26; Hemoglobin 13.0; Platelets 169; Potassium 3.8; Sodium 139   Lipid Panel No results found for: CHOL, TRIG, HDL, CHOLHDL, VLDL, LDLCALC, LDLDIRECT  Additional studies/ records that were reviewed today include:  2D ECHO: 09/26/2015 LV EF: 55% -   60% Study Conclusions - Left ventricle: The cavity size was normal. Wall thickness was   increased in a pattern of mild LVH. Systolic function was normal.   The estimated ejection fraction was in the range of 55% to 60%.   Wall motion was normal; there were no regional wall motion   abnormalities. Doppler parameters are consistent with abnormal   left ventricular relaxation (grade 1 diastolic dysfunction). - Aortic root: The aortic root was mildly dilated. - Mitral valve: Calcified annulus Impressions - Normal LV systolic function; grade 1 diastolic dysfunction;   trace TR.  01/2015 IMPRESSION: 1. No reversible ischemia or infarction. 2. Normal left ventricular wall motion. 3. Left  ventricular ejection fraction 63% 4. Low risk stress test findings*.  ASSESSMENT & PLAN:   SSCP/DOE: he appears euvolemic and had a recent normal myoview in 01/2015. ECHO on 09/26/15 reassuring with normal LV function and no WMAs. He is now so SOB with minimal activity and having nitrate responsive chest pain. Will set him up for [facility] and get labs today.   I have reviewed the risks, indications, and alternatives to cardiac catheterization and possible angioplasty/stenting with the patient. Risks include but are not limited to bleeding, infection, vascular injury, stroke, myocardial infection, arrhythmia, kidney injury, radiation-related injury in the case of prolonged fluoroscopy  use, emergency cardiac surgery, and death. The patient understands the risks of serious complication is low (123456).   CAD s/p prior stenting at [facility]: continue ASA 81mg  daily and BB. He is intolerant to statins  HTN: BP well controlled currently  Chronic diastolic CHF: continue torsemide 20 mg daily   CHB s/p PPM: followed by Dr. Curt Bears   Medication Adjustments/Labs and Tests Ordered: Current medicines are reviewed at length with the patient today.  Concerns regarding medicines are outlined above.  Medication changes, Labs and Tests ordered today are listed in the Patient Instructions below. Patient Instructions  Medication Instructions:  Your physician recommends that you continue on your current medications as directed. Please refer to the Current Medication list given to you today.   Labwork: TODAY;  BMET, CBC W/DIFF, & PT/INR  Testing/Procedures: Your physician has requested that you have a cardiac catheterization. Cardiac catheterization is used to diagnose and/or treat various heart conditions. Doctors may recommend this procedure for a number of different reasons. The most common reason is to evaluate chest pain. Chest pain can be a symptom of coronary artery disease (CAD), and cardiac catheterization can show  whether plaque is narrowing or blocking your heart's arteries. This procedure is also used to evaluate the valves, as well as measure the blood flow and oxygen levels in different parts of your heart. For further information please visit [email or web address]. Please follow instruction sheet, as given.    Follow-Up: Your physician recommends that you schedule a follow-up appointment in: WILL BE SET UPON DISCHARGE   Any Other Special Instructions Will Be Listed Below (If Applicable).   Coronary Angiogram A coronary angiogram, also called coronary angiography, is an X-ray procedure used to look at the arteries in the heart. In this procedure, a dye (contrast dye) is injected through a long, hollow tube (catheter). The catheter is about the size of a piece of cooked spaghetti and is inserted through your groin, wrist, or arm. The dye is injected into each artery, and X-rays are then taken to show if there is a blockage in the arteries of your heart. LET [facility] CARE PROVIDER KNOW ABOUT:  Any allergies you have, including allergies to shellfish or contrast dye.   All medicines you are taking, including vitamins, herbs, eye drops, creams, and over-the-counter medicines.   Previous problems you or members of your family have had with the use of anesthetics.   Any blood disorders you have.   Previous surgeries you have had.  History of kidney problems or failure.   Other medical conditions you have. RISKS AND COMPLICATIONS  Generally, a coronary angiogram is a safe procedure. However, problems can occur and include:  Allergic reaction to the dye.  Bleeding from the access site or other locations.  Kidney injury, especially in people with impaired kidney function.  Stroke (rare).  Heart attack (rare). BEFORE THE PROCEDURE   Do not eat or drink anything after midnight the night before the procedure or as directed by your health care provider.   Ask your health care  provider about changing or stopping your regular medicines. This is especially important if you are taking diabetes medicines or blood thinners. PROCEDURE  You may be given a medicine to help you relax (sedative) before the procedure. This medicine is given through an intravenous (IV) access tube that is inserted into one of your veins.   The area where the catheter will be inserted will be washed and shaved. This is usually done in the groin  but may be done in the fold of your arm (near your elbow) or in the wrist.   A medicine will be given to numb the area where the catheter will be inserted (local anesthetic).   The health care provider will insert the catheter into an artery. The catheter will be guided by using a special type of X-ray (fluoroscopy) of the blood vessel being examined.   A special dye will then be injected into the catheter, and X-rays will be taken. The dye will help to show where any narrowing or blockages are located in the heart arteries.  AFTER THE PROCEDURE   If the procedure is done through the leg, you will be kept in bed lying flat for several hours. You will be instructed to not bend or cross your legs.  The insertion site will be checked frequently.   The pulse in your feet or wrist will be checked frequently.   Additional blood tests, X-rays, and an electrocardiogram may be done.    This information is not intended to replace advice given to you by your health care provider. Make sure you discuss any questions you have with your health care provider.   Document Released: 07/04/2002 Document Revised: 01/18/2014 Document Reviewed: 05/22/2012 Elsevier Interactive Patient Education [employer].    If you need a refill on your cardiac medications before your next appointment, please call your pharmacy.      Signed, Angelena Form, PA-C  10/16/2015 2:41 PM    [facility] [address], [place], [place]   [address] Phone: [phone]; Fax: [phone]

## 2015-10-17 NOTE — Progress Notes (Signed)
Site area: Right groin a 5 french arterial sheath was removed  Site Prior to Removal:  Level 0  Pressure Applied For 15 MINUTES    Bedrest Beginning at 1600  Manual:   Yes.    Patient Status During Pull:  stable  Post Pull Groin Site:  Level 0  Post Pull Instructions Given:  Yes.    Post Pull Pulses Present:  Yes.    Dressing Applied:  Yes.    Comments:  VS remain stable during sheath pull

## 2015-10-17 NOTE — Discharge Instructions (Signed)
Groin Surgical Site Care °Refer to this sheet in the next few weeks. These instructions provide you with information about caring for yourself after your procedure. Your health care provider may also give you more specific instructions. Your treatment has been planned according to current medical practices, but problems sometimes occur. Call your health care provider if you have any problems or questions after your procedure. °WHAT TO EXPECT AFTER THE PROCEDURE °After your procedure, it is typical to have the following: °· Bruising at the groin site that usually fades within 1-2 weeks. °· Blood collecting in the tissue (hematoma) that may be painful to the touch. It should usually decrease in size and tenderness within 1-2 weeks. °HOME CARE INSTRUCTIONS °· Take medicines only as directed by your health care provider. °· You may shower 24-48 hours after the procedure or as directed by your health care provider. Remove the bandage (dressing) and gently wash the site with plain soap and water. Pat the area dry with a clean towel. Do not rub the site, because this may cause bleeding. °· Do not take baths, swim, or use a hot tub until your health care provider approves. °· Check your insertion site every day for redness, swelling, or drainage. °· Do not apply powder or lotion to the site. °· Limit use of stairs to twice a day for the first 2-3 days or as directed by your health care provider. °· Do not squat for the first 2-3 days or as directed by your health care provider. °· Do not lift over 10 lb (4.5 kg) for 5 days after your procedure or as directed by your health care provider. °· Ask your health care provider when it is okay to: °¨ Return to work or school. °¨ Resume usual physical activities or sports. °¨ Resume sexual activity. °· Do not drive home if you are discharged the same day as the procedure. Have someone else drive you. °· You may drive 24 hours after the procedure unless otherwise instructed by your  health care provider. °· Do not operate machinery or power tools for 24 hours after the procedure or as directed by your health care provider. °· If your procedure was done as an outpatient procedure, which means that you went home the same day as your procedure, a responsible adult should be with you for the first 24 hours after you arrive home. °· Keep all follow-up visits as directed by your health care provider. This is important. °SEEK MEDICAL CARE IF: °· You have a fever. °· You have chills. °· You have increased bleeding from the groin site. Hold pressure on the site. °SEEK IMMEDIATE MEDICAL CARE IF: °· You have unusual pain at the groin site. °· You have redness, warmth, or swelling at the groin site. °· You have drainage (other than a small amount of blood on the dressing) from the groin site. °· The groin site is bleeding, and the bleeding does not stop after 30 minutes of holding steady pressure on the site. °· Your leg or foot becomes pale, cool, tingly, or numb. °  °This information is not intended to replace advice given to you by your health care provider. Make sure you discuss any questions you have with your health care provider. °  °Document Released: 08/31/2013 Document Reviewed: 08/31/2013 °Elsevier Interactive Patient Education ©2016 Elsevier Inc. ° °

## 2015-10-18 ENCOUNTER — Telehealth: Payer: Self-pay | Admitting: Cardiology

## 2015-10-18 NOTE — Telephone Encounter (Signed)
Wife called her husband woke with significant SOB and ABD pain.  Cath was yesterday.  He has CAD.  I have asked pt to go to closest ER, they may try and come to [facility].

## 2015-10-20 ENCOUNTER — Other Ambulatory Visit: Payer: Self-pay | Admitting: Cardiology

## 2015-10-20 ENCOUNTER — Encounter (HOSPITAL_COMMUNITY): Payer: Self-pay | Admitting: Interventional Cardiology

## 2015-10-20 ENCOUNTER — Telehealth: Payer: Self-pay | Admitting: Interventional Cardiology

## 2015-10-20 ENCOUNTER — Other Ambulatory Visit: Payer: Self-pay | Admitting: *Deleted

## 2015-10-20 MED ORDER — CLOPIDOGREL BISULFATE 300 MG PO TABS: 300.0000 mg | ORAL_TABLET | Freq: Every day | ORAL | 0 refills | Status: DC

## 2015-10-20 MED ORDER — CLOPIDOGREL BISULFATE 75 MG PO TABS: 75.0000 mg | ORAL_TABLET | Freq: Every day | ORAL | 12 refills | Status: DC

## 2015-10-20 MED FILL — Heparin Sodium (Porcine) Inj 1000 Unit/ML: INTRAMUSCULAR | Qty: 10 | Status: AC

## 2015-10-20 MED FILL — Verapamil HCl IV Soln 2.5 MG/ML: INTRAVENOUS | Qty: 2 | Status: AC

## 2015-10-20 NOTE — Telephone Encounter (Signed)
New message    Wife calling checking on next step was told patient would be schedule for another procedure.

## 2015-10-20 NOTE — Telephone Encounter (Signed)
**Note De-Identified  Obfuscation** Dr Irish Lack did the pts cath. Pt sees Dr Curt Bears. Will forward message to Sherri, Dr Curt Bears nurse.

## 2015-10-20 NOTE — Telephone Encounter (Signed)
Informed wife that I would look into procedure/timing and I or another nurse would call her to discuss/reiveiw instructions.  She thanks me for helping.

## 2015-10-20 NOTE — Telephone Encounter (Signed)
Wife informs me someone from the hospital called and gave her instructions for procedure scheduled for Wednesday.  She thanks me for following up on this.

## 2015-10-22 ENCOUNTER — Encounter (HOSPITAL_COMMUNITY): Payer: Self-pay | Admitting: *Deleted

## 2015-10-22 ENCOUNTER — Encounter (HOSPITAL_COMMUNITY)
Admission: AD | Disposition: A | Discharge status: Home / Self Care | Payer: Self-pay | Source: Ambulatory Visit | Attending: Interventional Cardiology

## 2015-10-22 ENCOUNTER — Inpatient Hospital Stay (HOSPITAL_COMMUNITY)
Admission: AD | Admit: 2015-10-22 | Discharge: 2015-10-25 | DRG: 246 | Disposition: A | Discharge status: Home / Self Care | Payer: Medicare Other | Source: Ambulatory Visit | Attending: Internal Medicine | Admitting: Internal Medicine

## 2015-10-22 DIAGNOSIS — Z888 Allergy status to other drugs, medicaments and biological substances status: Secondary | ICD-10-CM

## 2015-10-22 DIAGNOSIS — Z7901 Long term (current) use of anticoagulants: Secondary | ICD-10-CM

## 2015-10-22 DIAGNOSIS — R0600 Dyspnea, unspecified: Secondary | ICD-10-CM | POA: Diagnosis present

## 2015-10-22 DIAGNOSIS — R0609 Other forms of dyspnea: Secondary | ICD-10-CM | POA: Diagnosis present

## 2015-10-22 DIAGNOSIS — Z955 Presence of coronary angioplasty implant and graft: Secondary | ICD-10-CM

## 2015-10-22 DIAGNOSIS — I25110 Atherosclerotic heart disease of native coronary artery with unstable angina pectoris: Secondary | ICD-10-CM | POA: Diagnosis not present

## 2015-10-22 DIAGNOSIS — Z9861 Coronary angioplasty status: Secondary | ICD-10-CM

## 2015-10-22 DIAGNOSIS — R29810 Facial weakness: Secondary | ICD-10-CM

## 2015-10-22 DIAGNOSIS — R262 Difficulty in walking, not elsewhere classified: Secondary | ICD-10-CM

## 2015-10-22 DIAGNOSIS — Z950 Presence of cardiac pacemaker: Secondary | ICD-10-CM | POA: Diagnosis present

## 2015-10-22 DIAGNOSIS — E785 Hyperlipidemia, unspecified: Secondary | ICD-10-CM

## 2015-10-22 DIAGNOSIS — I5032 Chronic diastolic (congestive) heart failure: Secondary | ICD-10-CM | POA: Diagnosis present

## 2015-10-22 DIAGNOSIS — I10 Essential (primary) hypertension: Secondary | ICD-10-CM

## 2015-10-22 DIAGNOSIS — G4733 Obstructive sleep apnea (adult) (pediatric): Secondary | ICD-10-CM | POA: Diagnosis present

## 2015-10-22 DIAGNOSIS — I6330 Cerebral infarction due to thrombosis of unspecified cerebral artery: Secondary | ICD-10-CM | POA: Insufficient documentation

## 2015-10-22 DIAGNOSIS — R29720 NIHSS score 20: Secondary | ICD-10-CM | POA: Diagnosis present

## 2015-10-22 DIAGNOSIS — R4182 Altered mental status, unspecified: Secondary | ICD-10-CM | POA: Diagnosis not present

## 2015-10-22 DIAGNOSIS — I639 Cerebral infarction, unspecified: Secondary | ICD-10-CM

## 2015-10-22 DIAGNOSIS — I110 Hypertensive heart disease with heart failure: Secondary | ICD-10-CM | POA: Diagnosis present

## 2015-10-22 HISTORY — DX: Unspecified osteoarthritis, unspecified site: M19.90

## 2015-10-22 HISTORY — DX: Other chronic pain: G89.29

## 2015-10-22 HISTORY — DX: Pure hypercholesterolemia, unspecified: E78.00

## 2015-10-22 HISTORY — DX: Atrioventricular block, complete: I44.2

## 2015-10-22 HISTORY — DX: Atherosclerotic heart disease of native coronary artery without angina pectoris: I25.10

## 2015-10-22 HISTORY — DX: Pain in unspecified shoulder: M25.519

## 2015-10-22 HISTORY — PX: CARDIAC CATHETERIZATION: SHX172

## 2015-10-22 HISTORY — DX: Presence of cardiac pacemaker: Z95.0

## 2015-10-22 HISTORY — DX: Anxiety disorder, unspecified: F41.9

## 2015-10-22 HISTORY — DX: Unspecified malignant neoplasm of skin of unspecified part of face: C44.300

## 2015-10-22 LAB — POCT ACTIVATED CLOTTING TIME
ACTIVATED CLOTTING TIME: 235 s
ACTIVATED CLOTTING TIME: 181 s
Activated Clotting Time: 241 s
Activated Clotting Time: 252 s
Activated Clotting Time: 263 s
Activated Clotting Time: 202 s

## 2015-10-22 SURGERY — CORONARY STENT INTERVENTION

## 2015-10-22 MED ORDER — SODIUM CHLORIDE 0.9 % WEIGHT BASED INFUSION
1.0000 mL/kg/h | INTRAVENOUS | Status: DC
Start: 2015-10-22 — End: 2015-10-22

## 2015-10-22 MED ORDER — HEPARIN (PORCINE) IN NACL 2-0.9 UNIT/ML-% IJ SOLN
INTRAMUSCULAR | Status: AC
  Filled 2015-10-22: qty 500

## 2015-10-22 MED ORDER — CLOPIDOGREL BISULFATE 75 MG PO TABS: 75.0000 mg | ORAL_TABLET | Freq: Every day | ORAL | Status: DC

## 2015-10-22 MED ORDER — FENTANYL CITRATE (PF) 100 MCG/2ML IJ SOLN
INTRAMUSCULAR | Status: AC
  Filled 2015-10-22: qty 2

## 2015-10-22 MED ORDER — HEPARIN (PORCINE) IN NACL 2-0.9 UNIT/ML-% IJ SOLN
INTRAMUSCULAR | Status: DC | PRN
  Administered 2015-10-22: 1000 mL

## 2015-10-22 MED ORDER — NITROGLYCERIN 1 MG/10 ML FOR IR/CATH LAB
INTRA_ARTERIAL | Status: DC | PRN
  Administered 2015-10-22: 200 ug via INTRACORONARY

## 2015-10-22 MED ORDER — ADULT MULTIVITAMIN W/MINERALS CH
1.0000 | ORAL_TABLET | Freq: Every day | ORAL | Status: DC
  Administered 2015-10-23 – 2015-10-25 (×3): 1 via ORAL
  Filled 2015-10-22 (×3): qty 1

## 2015-10-22 MED ORDER — ESCITALOPRAM OXALATE 10 MG PO TABS
10.0000 mg | ORAL_TABLET | Freq: Every day | ORAL | Status: DC
  Administered 2015-10-23 – 2015-10-25 (×3): 10 mg via ORAL
  Filled 2015-10-22 (×3): qty 1

## 2015-10-22 MED ORDER — ASPIRIN 81 MG PO CHEW
81.0000 mg | CHEWABLE_TABLET | Freq: Every day | ORAL | Status: DC
  Administered 2015-10-23 – 2015-10-25 (×3): 81 mg via ORAL
  Filled 2015-10-22 (×3): qty 1

## 2015-10-22 MED ORDER — NITROGLYCERIN 1 MG/10 ML FOR IR/CATH LAB
INTRA_ARTERIAL | Status: AC
  Filled 2015-10-22: qty 50

## 2015-10-22 MED ORDER — MIDAZOLAM HCL 2 MG/2ML IJ SOLN
INTRAMUSCULAR | Status: AC
  Filled 2015-10-22: qty 2

## 2015-10-22 MED ORDER — ONDANSETRON HCL 4 MG/2ML IJ SOLN: 4.0000 mg | Freq: Four times a day (QID) | INTRAMUSCULAR | Status: DC | PRN

## 2015-10-22 MED ORDER — CLOPIDOGREL BISULFATE 75 MG PO TABS: 300.0000 mg | ORAL_TABLET | Freq: Every day | ORAL | Status: DC

## 2015-10-22 MED ORDER — SODIUM CHLORIDE 0.9 % IV SOLN: 250.0000 mL | INTRAVENOUS | Status: DC | PRN

## 2015-10-22 MED ORDER — HEPARIN SODIUM (PORCINE) 1000 UNIT/ML IJ SOLN
INTRAMUSCULAR | Status: AC
  Filled 2015-10-22: qty 2

## 2015-10-22 MED ORDER — VERAPAMIL HCL 2.5 MG/ML IV SOLN
INTRAVENOUS | Status: AC
  Filled 2015-10-22: qty 4

## 2015-10-22 MED ORDER — VERAPAMIL HCL 2.5 MG/ML IV SOLN: INTRAVENOUS | Status: DC | PRN

## 2015-10-22 MED ORDER — SODIUM CHLORIDE 0.9 % WEIGHT BASED INFUSION: 1.0000 mL/kg/h | INTRAVENOUS | Status: AC

## 2015-10-22 MED ORDER — SODIUM CHLORIDE 0.9% FLUSH: 3.0000 mL | Freq: Two times a day (BID) | INTRAVENOUS | Status: DC

## 2015-10-22 MED ORDER — IOPAMIDOL (ISOVUE-370) INJECTION 76%
INTRAVENOUS | Status: DC | PRN
  Administered 2015-10-22: 100 mL via INTRAVENOUS

## 2015-10-22 MED ORDER — RAMIPRIL 2.5 MG PO CAPS
2.5000 mg | ORAL_CAPSULE | Freq: Every day | ORAL | Status: DC
  Administered 2015-10-22 – 2015-10-24 (×3): 2.5 mg via ORAL
  Filled 2015-10-22 (×4): qty 1

## 2015-10-22 MED ORDER — MIDAZOLAM HCL 2 MG/2ML IJ SOLN
INTRAMUSCULAR | Status: DC | PRN
  Administered 2015-10-22 (×2): 1 mg via INTRAVENOUS

## 2015-10-22 MED ORDER — LEVOTHYROXINE SODIUM 25 MCG PO TABS
25.0000 ug | ORAL_TABLET | Freq: Every day | ORAL | Status: DC
  Administered 2015-10-23 – 2015-10-25 (×3): 25 ug via ORAL
  Filled 2015-10-22 (×3): qty 1

## 2015-10-22 MED ORDER — ACETAMINOPHEN 325 MG PO TABS
650.0000 mg | ORAL_TABLET | ORAL | Status: DC | PRN
  Administered 2015-10-22 (×2): 650 mg via ORAL
  Filled 2015-10-22: qty 2

## 2015-10-22 MED ORDER — LIDOCAINE HCL (PF) 1 % IJ SOLN
INTRAMUSCULAR | Status: AC
  Filled 2015-10-22: qty 30

## 2015-10-22 MED ORDER — HYDRALAZINE HCL 20 MG/ML IJ SOLN
INTRAMUSCULAR | Status: DC | PRN
  Administered 2015-10-22: 10 mg via INTRAVENOUS

## 2015-10-22 MED ORDER — NITROGLYCERIN 0.4 MG SL SUBL: 0.4000 mg | SUBLINGUAL_TABLET | Freq: Every day | SUBLINGUAL | Status: DC | PRN

## 2015-10-22 MED ORDER — SODIUM CHLORIDE 0.9 % WEIGHT BASED INFUSION
3.0000 mL/kg/h | INTRAVENOUS | Status: DC
  Administered 2015-10-22: 3 mL/kg/h via INTRAVENOUS

## 2015-10-22 MED ORDER — SODIUM CHLORIDE 0.9% FLUSH: 3.0000 mL | INTRAVENOUS | Status: DC | PRN

## 2015-10-22 MED ORDER — ALUM & MAG HYDROXIDE-SIMETH 200-200-20 MG/5ML PO SUSP
30.0000 mL | ORAL | Status: DC | PRN
  Administered 2015-10-22: 30 mL via ORAL
  Filled 2015-10-22: qty 30

## 2015-10-22 MED ORDER — CLOPIDOGREL BISULFATE 75 MG PO TABS
75.0000 mg | ORAL_TABLET | Freq: Every day | ORAL | Status: DC
  Administered 2015-10-23 – 2015-10-25 (×3): 75 mg via ORAL
  Filled 2015-10-22 (×3): qty 1

## 2015-10-22 MED ORDER — FENTANYL CITRATE (PF) 100 MCG/2ML IJ SOLN
INTRAMUSCULAR | Status: DC | PRN
  Administered 2015-10-22 (×2): 25 ug via INTRAVENOUS

## 2015-10-22 MED ORDER — HYDRALAZINE HCL 20 MG/ML IJ SOLN
INTRAMUSCULAR | Status: AC
  Filled 2015-10-22: qty 1

## 2015-10-22 MED ORDER — HEPARIN SODIUM (PORCINE) 1000 UNIT/ML IJ SOLN
INTRAMUSCULAR | Status: DC | PRN
  Administered 2015-10-22: 3000 [IU] via INTRAVENOUS
  Administered 2015-10-22: 9000 [IU] via INTRAVENOUS
  Administered 2015-10-22 (×2): 2000 [IU] via INTRAVENOUS

## 2015-10-22 MED ORDER — ACETAMINOPHEN 325 MG PO TABS
ORAL_TABLET | ORAL | Status: AC
  Filled 2015-10-22: qty 2

## 2015-10-22 MED ORDER — SODIUM CHLORIDE 0.9% FLUSH
3.0000 mL | Freq: Two times a day (BID) | INTRAVENOUS | Status: DC
  Administered 2015-10-23 – 2015-10-25 (×4): 3 mL via INTRAVENOUS

## 2015-10-22 MED ORDER — ASPIRIN EC 81 MG PO TBEC: 81.0000 mg | DELAYED_RELEASE_TABLET | Freq: Every day | ORAL | Status: DC

## 2015-10-22 MED ORDER — VERAPAMIL HCL 2.5 MG/ML IV SOLN
INTRA_ARTERIAL | Status: DC | PRN
  Administered 2015-10-22: 14:00:00 via INTRACORONARY

## 2015-10-22 MED ORDER — ASPIRIN 81 MG PO CHEW
CHEWABLE_TABLET | ORAL | Status: AC
  Filled 2015-10-22: qty 1

## 2015-10-22 MED ORDER — ASPIRIN 81 MG PO CHEW
81.0000 mg | CHEWABLE_TABLET | ORAL | Status: AC
  Administered 2015-10-22: 81 mg via ORAL

## 2015-10-22 MED ORDER — METOPROLOL SUCCINATE ER 25 MG PO TB24
50.0000 mg | ORAL_TABLET | Freq: Every day | ORAL | Status: DC
  Administered 2015-10-22 – 2015-10-25 (×4): 50 mg via ORAL
  Filled 2015-10-22 (×2): qty 1
  Filled 2015-10-22 (×2): qty 2

## 2015-10-22 MED ORDER — LIDOCAINE HCL (PF) 1 % IJ SOLN
INTRAMUSCULAR | Status: DC | PRN
  Administered 2015-10-22: 17 mL

## 2015-10-22 MED ORDER — ISOSORBIDE MONONITRATE ER 30 MG PO TB24
30.0000 mg | ORAL_TABLET | Freq: Every day | ORAL | Status: DC
  Administered 2015-10-23 – 2015-10-25 (×3): 30 mg via ORAL
  Filled 2015-10-22 (×4): qty 1

## 2015-10-22 MED ORDER — ANGIOPLASTY BOOK
Freq: Once | Status: AC
  Administered 2015-10-22: 21:00:00
  Filled 2015-10-22: qty 1

## 2015-10-22 MED ORDER — FENTANYL CITRATE (PF) 100 MCG/2ML IJ SOLN
12.5000 ug | Freq: Once | INTRAMUSCULAR | Status: AC
Start: 2015-10-22 — End: 2015-10-22
  Administered 2015-10-22: 12.5 ug via INTRAVENOUS

## 2015-10-22 SURGICAL SUPPLY — 23 items
BALLN EMERGE MR 2.5X15 (BALLOONS) ×3
BALLN MAVERICK OTW 1.5X15 (BALLOONS) ×3
BALLN ~~LOC~~ EUPHORA RX 3.5X12 (BALLOONS) ×3
BALLOON EMERGE MR 2.5X15 (BALLOONS) ×1 IMPLANT
BALLOON MAVERICK OTW 1.5X15 (BALLOONS) ×1 IMPLANT
BALLOON ~~LOC~~ EUPHORA RX 3.5X12 (BALLOONS) ×1 IMPLANT
BURR ROTALINK 1.75MM (BURR) ×3 IMPLANT
CATH ROTALINK PLUS 1.50MM (BURR) ×3 IMPLANT
GUIDE CATH MACH 1 7F FR4 (CATHETERS) ×3 IMPLANT
KIT ENCORE 26 ADVANTAGE (KITS) ×3 IMPLANT
KIT HEART LEFT (KITS) ×3 IMPLANT
LUBRICANT ROTAGLIDE 20CC VIAL (MISCELLANEOUS) ×3 IMPLANT
PACK CARDIAC CATHETERIZATION (CUSTOM PROCEDURE TRAY) ×3 IMPLANT
SHEATH PINNACLE 7F 10CM (SHEATH) ×3 IMPLANT
STENT SYNERGY DES 2.75X28 (Permanent Stent) ×3 IMPLANT
TRANSDUCER W/STOPCOCK (MISCELLANEOUS) ×3 IMPLANT
TUBING CIL FLEX 10 FLL-RA (TUBING) ×3 IMPLANT
VALVE GUARDIAN II ~~LOC~~ HEMO (MISCELLANEOUS) ×3 IMPLANT
WIRE EMERALD 3MM-J .035X150CM (WIRE) ×6 IMPLANT
WIRE EXTRA SUPPORT .009X325CM (WIRE) ×3 IMPLANT
WIRE MARVEL STR TIP 190CM (WIRE) ×3 IMPLANT
WIRE ROTA FLOPPY .009X325CM (WIRE) ×3 IMPLANT
WIRE STRETCH EXTENSION (WIRE) ×2 IMPLANT

## 2015-10-22 NOTE — Progress Notes (Signed)
Site area: Right groin a 7 french arterial sheath was removed  Site Prior to Removal:  Level 0  Pressure Applied For 15 MINUTES    Bedrest Beginning at 1800p  Manual:   Yes.    Patient Status During Pull:  stable  Post Pull Groin Site:  Level 0  Post Pull Instructions Given:  Yes.    Post Pull Pulses Present:  Yes.    Dressing Applied:  Yes.    Comments:  VS remain stable at this time

## 2015-10-22 NOTE — Interval H&P Note (Signed)
Cath Lab Visit (complete for each Cath Lab visit)  Clinical Evaluation Leading to the Procedure:   ACS: No.  Non-ACS:    Anginal Classification: CCS III  Anti-ischemic medical therapy: Maximal Therapy (2 or more classes of medications)  Non-Invasive Test Results: No non-invasive testing performed  Prior CABG: No previous CABG   Planned rotational atherectomy.  Patient on plavix.  Risks explained to the family.    History and Physical Interval Note:  10/22/2015 12:15 PM  Deyan Leavins  has presented today for surgery, with the diagnosis of cad  The various methods of treatment have been discussed with the patient and family. After consideration of risks, benefits and other options for treatment, the patient has consented to  Procedure(s): Coronary Stent Intervention Rotablater (N/A) as a surgical intervention .  The patient's history has been reviewed, patient examined, no change in status, stable for surgery.  I have reviewed the patient's chart and labs.  Questions were answered to the patient's satisfaction.     Larae Grooms

## 2015-10-22 NOTE — H&P (View-Only) (Signed)
Cardiology Office Note    Date:  10/16/2015   ID:  [name], DOB [birth year], MRN [id number]  PCP:  Brandon Berger, MD  Cardiologist:  Dr. Curt Bears previously Dr. Bettina Gavia in [place] but they would like to establish care here --> will set up with Dr. Irish Lack who will be doing his cath   CC: SOB  History of Present Illness:  [name] is a 75 y.o. 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   He has a history of CAD wtih remote stents x2 about 20 years ago, reportedly had cath about 4 years ago with what sounds PTCA (no reports in CareEverywhere that I can find). He had a normal echo and nuclear stress test in 01/2015.   He presented to [facility] in 02/2015 with syncope. He was found to have bradycardia with sinus arrest. The patient underwent implantation of a STJ dual chamber pacemaker. Since that time, he had a device check which showed R waves that had decreased from 17.9 to 3.7 mV. Lead revision performed on 04/10/15.  He saw Dr. Curt Bears on 07/02/15 and complained of SOB. An echocardiogram was ordered which showed normal LV function with no WMAs, mild LVH, G1DD, mildly dilated aortic root.   Today he presents to clinic for follow up. He has been having shortness of breath since pacemaker placement. But over the past three weeks he has been so short of breath with minimal activity. He has had to take take 3 SL NTG x3 weeks for chest pain with good relief. Before that he has had no chest pain. Chest pain comes out of nowhere and not necessarily related to exertion. No LE edema, orthopnea or PND. He has been sleeping a lot more lately. Having a lot of fatigue. Recently seen by PCP and thyroid and labs are good. No dizziness or syncope.   Past Medical History:  Diagnosis Date  . CHF (congestive heart failure) ([place])   . Heart attack 'many years ago'  . Hypertension   .  OSA (obstructive sleep apnea)     Past Surgical History:  Procedure Laterality Date  . CARDIAC CATHETERIZATION    . EP IMPLANTABLE DEVICE N/A 03/04/2015   Procedure: Pacemaker Implant;  Surgeon: Will Meredith Leeds, MD;  Location: [place] [facility];  Service: Cardiovascular;  Laterality: N/A;  . EP IMPLANTABLE DEVICE N/A 04/10/2015   Procedure: PPM Lead Revision/Repair;  Surgeon: Will Meredith Leeds, MD;  Location: [place] [facility];  Service: Cardiovascular;  Laterality: N/A;    Current Medications: Outpatient Medications Prior to Visit  Medication Sig Dispense Refill  . Coenzyme Q10 (COQ10 PO) Take 1 capsule by mouth daily as needed (takes occasionally).    . metoprolol succinate (TOPROL XL) 50 MG 24 hr tablet Take 1 tablet (50 mg total) by mouth daily. Take with or immediately following a meal. 90 tablet 3  . Multiple Vitamin (MULTIVITAMIN WITH MINERALS) TABS tablet Take 1 tablet by mouth daily as needed (takes occasionally).     . nitroGLYCERIN (NITROSTAT) 0.4 MG SL tablet Place 0.4 mg under the tongue daily as needed for chest pain. Chest pain    . Omega-3 1000 MG CAPS Take 1 g by mouth daily as needed (takes occasionally).     . ramipril (ALTACE) 2.5 MG capsule Take 2.5 mg by mouth daily.    Marland Kitchen torsemide (DEMADEX) 20 MG tablet Take  20 mg by mouth daily.     No facility-administered medications prior to visit.      Allergies:   Novocain [procaine]; Rosuvastatin calcium; and Statins   Social History   Social History  . Marital status: Married    Spouse name: N/A  . Number of children: N/A  . Years of education: N/A   Social History Main Topics  . Smoking status: Former Research scientist (life sciences)  . Smokeless tobacco: Never Used  . Alcohol use No  . Drug use: No  . Sexual activity: Not Asked   Other Topics Concern  . None   Social History Narrative  . None     Family History:  The patient's family history includes Diabetes in his brother and sister.     ROS:   Please see the  history of present illness.    ROS All other systems reviewed and are negative.   PHYSICAL EXAM:   VS:  BP 122/88   Pulse 75   Ht 6' (1.829 m)   Wt 230 lb 12.8 oz (104.7 kg)   SpO2 97%   BMI 31.30 kg/m    GEN: Well nourished, well developed, in no acute distress  HEENT: normal  Neck: no JVD, carotid bruits, or masses Cardiac: RRR; no murmurs, rubs, or gallops,no edema  Respiratory:  clear to auscultation bilaterally, normal work of breathing GI: soft, nontender, nondistended, + BS MS: no deformity or atrophy  Skin: warm and dry, no rash Neuro:  Alert and Oriented x 3, Strength and sensation are intact Psych: euthymic mood, full affect  Wt Readings from Last 3 Encounters:  10/16/15 230 lb 12.8 oz (104.7 kg)  07/02/15 224 lb 3.2 oz (101.7 kg)  04/11/15 229 lb 11.5 oz (104.2 kg)      Studies/Labs Reviewed:   EKG:  EKG is NOT ordered today.    Recent Labs: 03/03/2015: B Natriuretic Peptide 17.6 03/14/2015: ALT 38 04/03/2015: BUN 10; Creat 1.26; Hemoglobin 13.0; Platelets 169; Potassium 3.8; Sodium 139   Lipid Panel No results found for: CHOL, TRIG, HDL, CHOLHDL, VLDL, LDLCALC, LDLDIRECT  Additional studies/ records that were reviewed today include:  2D ECHO: 09/26/2015 LV EF: 55% -   60% Study Conclusions - Left ventricle: The cavity size was normal. Wall thickness was   increased in a pattern of mild LVH. Systolic function was normal.   The estimated ejection fraction was in the range of 55% to 60%.   Wall motion was normal; there were no regional wall motion   abnormalities. Doppler parameters are consistent with abnormal   left ventricular relaxation (grade 1 diastolic dysfunction). - Aortic root: The aortic root was mildly dilated. - Mitral valve: Calcified annulus Impressions - Normal LV systolic function; grade 1 diastolic dysfunction;   trace TR.  01/2015 IMPRESSION: 1. No reversible ischemia or infarction. 2. Normal left ventricular wall motion. 3. Left  ventricular ejection fraction 63% 4. Low risk stress test findings*.  ASSESSMENT & PLAN:   SSCP/DOE: he appears euvolemic and had a recent normal myoview in 01/2015. ECHO on 09/26/15 reassuring with normal LV function and no WMAs. He is now so SOB with minimal activity and having nitrate responsive chest pain. Will set him up for [facility] and get labs today.   I have reviewed the risks, indications, and alternatives to cardiac catheterization and possible angioplasty/stenting with the patient. Risks include but are not limited to bleeding, infection, vascular injury, stroke, myocardial infection, arrhythmia, kidney injury, radiation-related injury in the case of prolonged fluoroscopy  use, emergency cardiac surgery, and death. The patient understands the risks of serious complication is low (123456).   CAD s/p prior stenting at [facility]: continue ASA 81mg  daily and BB. He is intolerant to statins  HTN: BP well controlled currently  Chronic diastolic CHF: continue torsemide 20 mg daily   CHB s/p PPM: followed by Dr. Curt Bears   Medication Adjustments/Labs and Tests Ordered: Current medicines are reviewed at length with the patient today.  Concerns regarding medicines are outlined above.  Medication changes, Labs and Tests ordered today are listed in the Patient Instructions below. Patient Instructions  Medication Instructions:  Your physician recommends that you continue on your current medications as directed. Please refer to the Current Medication list given to you today.   Labwork: TODAY;  BMET, CBC W/DIFF, & PT/INR  Testing/Procedures: Your physician has requested that you have a cardiac catheterization. Cardiac catheterization is used to diagnose and/or treat various heart conditions. Doctors may recommend this procedure for a number of different reasons. The most common reason is to evaluate chest pain. Chest pain can be a symptom of coronary artery disease (CAD), and cardiac catheterization can show  whether plaque is narrowing or blocking your heart's arteries. This procedure is also used to evaluate the valves, as well as measure the blood flow and oxygen levels in different parts of your heart. For further information please visit [email or web address]. Please follow instruction sheet, as given.    Follow-Up: Your physician recommends that you schedule a follow-up appointment in: WILL BE SET UPON DISCHARGE   Any Other Special Instructions Will Be Listed Below (If Applicable).   Coronary Angiogram A coronary angiogram, also called coronary angiography, is an X-ray procedure used to look at the arteries in the heart. In this procedure, a dye (contrast dye) is injected through a long, hollow tube (catheter). The catheter is about the size of a piece of cooked spaghetti and is inserted through your groin, wrist, or arm. The dye is injected into each artery, and X-rays are then taken to show if there is a blockage in the arteries of your heart. LET [facility] CARE PROVIDER KNOW ABOUT:  Any allergies you have, including allergies to shellfish or contrast dye.   All medicines you are taking, including vitamins, herbs, eye drops, creams, and over-the-counter medicines.   Previous problems you or members of your family have had with the use of anesthetics.   Any blood disorders you have.   Previous surgeries you have had.  History of kidney problems or failure.   Other medical conditions you have. RISKS AND COMPLICATIONS  Generally, a coronary angiogram is a safe procedure. However, problems can occur and include:  Allergic reaction to the dye.  Bleeding from the access site or other locations.  Kidney injury, especially in people with impaired kidney function.  Stroke (rare).  Heart attack (rare). BEFORE THE PROCEDURE   Do not eat or drink anything after midnight the night before the procedure or as directed by your health care provider.   Ask your health care  provider about changing or stopping your regular medicines. This is especially important if you are taking diabetes medicines or blood thinners. PROCEDURE  You may be given a medicine to help you relax (sedative) before the procedure. This medicine is given through an intravenous (IV) access tube that is inserted into one of your veins.   The area where the catheter will be inserted will be washed and shaved. This is usually done in the groin  but may be done in the fold of your arm (near your elbow) or in the wrist.   A medicine will be given to numb the area where the catheter will be inserted (local anesthetic).   The health care provider will insert the catheter into an artery. The catheter will be guided by using a special type of X-ray (fluoroscopy) of the blood vessel being examined.   A special dye will then be injected into the catheter, and X-rays will be taken. The dye will help to show where any narrowing or blockages are located in the heart arteries.  AFTER THE PROCEDURE   If the procedure is done through the leg, you will be kept in bed lying flat for several hours. You will be instructed to not bend or cross your legs.  The insertion site will be checked frequently.   The pulse in your feet or wrist will be checked frequently.   Additional blood tests, X-rays, and an electrocardiogram may be done.    This information is not intended to replace advice given to you by your health care provider. Make sure you discuss any questions you have with your health care provider.   Document Released: 07/04/2002 Document Revised: 01/18/2014 Document Reviewed: 05/22/2012 Elsevier Interactive Patient Education [employer].    If you need a refill on your cardiac medications before your next appointment, please call your pharmacy.      Signed, Angelena Form, PA-C  10/16/2015 2:41 PM    [facility] [place], [place], [place]   [address] Phone: [phone]; Fax: [phone]

## 2015-10-23 ENCOUNTER — Ambulatory Visit (HOSPITAL_COMMUNITY): Payer: Medicare Other

## 2015-10-23 ENCOUNTER — Encounter (HOSPITAL_COMMUNITY): Payer: Self-pay | Admitting: Interventional Cardiology

## 2015-10-23 DIAGNOSIS — R29810 Facial weakness: Secondary | ICD-10-CM | POA: Diagnosis not present

## 2015-10-23 DIAGNOSIS — Z955 Presence of coronary angioplasty implant and graft: Secondary | ICD-10-CM

## 2015-10-23 DIAGNOSIS — Z950 Presence of cardiac pacemaker: Secondary | ICD-10-CM | POA: Diagnosis present

## 2015-10-23 DIAGNOSIS — I200 Unstable angina: Secondary | ICD-10-CM | POA: Diagnosis not present

## 2015-10-23 DIAGNOSIS — I6330 Cerebral infarction due to thrombosis of unspecified cerebral artery: Secondary | ICD-10-CM | POA: Diagnosis not present

## 2015-10-23 DIAGNOSIS — R4182 Altered mental status, unspecified: Secondary | ICD-10-CM | POA: Diagnosis not present

## 2015-10-23 LAB — BASIC METABOLIC PANEL
Anion gap: 5 (ref 5–15)
BUN: 12 mg/dL (ref 6–20)
CHLORIDE: 107 mmol/L (ref 101–111)
CO2: 27 mmol/L (ref 22–32)
CREATININE: 1.31 mg/dL — AB (ref 0.61–1.24)
Calcium: 8.8 mg/dL — ABNORMAL LOW (ref 8.9–10.3)
GFR calc Af Amer: 59 mL/min — ABNORMAL LOW (ref >60)
GFR calc non Af Amer: 51 mL/min — ABNORMAL LOW (ref >60)
GLUCOSE: 120 mg/dL — AB (ref 65–99)
POTASSIUM: 3.7 mmol/L (ref 3.5–5.1)
Sodium: 139 mmol/L (ref 135–145)

## 2015-10-23 LAB — CBC
HEMATOCRIT: 35.1 % — AB (ref 39.0–52.0)
Hemoglobin: 12.3 g/dL — ABNORMAL LOW (ref 13.0–17.0)
MCH: 32.1 pg (ref 26.0–34.0)
MCHC: 35 g/dL (ref 30.0–36.0)
MCV: 91.6 fL (ref 78.0–100.0)
Platelets: 143 10*3/uL — ABNORMAL LOW (ref 150–400)
RBC: 3.83 MIL/uL — ABNORMAL LOW (ref 4.22–5.81)
RDW: 13.2 % (ref 11.5–15.5)
WBC: 7 10*3/uL (ref 4.0–10.5)

## 2015-10-23 LAB — VITAMIN B12: Vitamin B-12: 209 pg/mL (ref 180–914)

## 2015-10-23 LAB — BLOOD GAS, ARTERIAL
Acid-Base Excess: 1.4 mmol/L (ref 0.0–2.0)
Bicarbonate: 24.8 mmol/L (ref 20.0–28.0)
Drawn by: 305991
FIO2: 0.21
O2 Saturation: 94.4 %
Patient temperature: 98.6
pCO2 arterial: 35.3 mmHg (ref 32.0–48.0)
pH, Arterial: 7.461 — ABNORMAL HIGH (ref 7.350–7.450)
pO2, Arterial: 67.4 mmHg — ABNORMAL LOW (ref 83.0–108.0)

## 2015-10-23 LAB — TSH: TSH: 1.948 u[IU]/mL (ref 0.350–4.500)

## 2015-10-23 MED ORDER — STROKE: EARLY STAGES OF RECOVERY BOOK
Freq: Once | Status: AC
  Administered 2015-10-23: 17:00:00
  Filled 2015-10-23: qty 1

## 2015-10-23 MED ORDER — IOPAMIDOL (ISOVUE-370) INJECTION 76%
INTRAVENOUS | Status: AC
  Administered 2015-10-23: 14:00:00 50 mL
  Filled 2015-10-23: qty 50

## 2015-10-23 NOTE — Progress Notes (Addendum)
Pt with transfer order to 5M10. Report given to [facility]. V/S stable. Pt is A & O. No c/o any chest pain or discomfort. Transferred without difficulty @20 :50.

## 2015-10-23 NOTE — Progress Notes (Signed)
Order was received for MRI of the brain.  Pt has a Musician [id number].  Upon investigating and getting the list, the [id number] is listed on their website as having NOT been tested in MRI.  Spoke with Dr Nevada Crane the radiologist and he advised [place] not to proceed and he would contact Dr Leonel Ramsay from neuro services.

## 2015-10-23 NOTE — Care Management Note (Signed)
Case Management Note  Patient Details  Name: Wilkie Packwood MRN: [id number] Date of Birth: 1940/08/12  Subjective/Objective:  S/p intervention, will be on plavix and asa, NCM will cont to follow for dc needs.                  Action/Plan:   Expected Discharge Date:                  Expected Discharge Plan:  Home/Self Care  In-House Referral:     Discharge planning Services  CM Consult  Post Acute Care Choice:    Choice offered to:     DME Arranged:    DME Agency:     HH Arranged:    HH Agency:     Status of Service:  Completed, signed off  If discussed at [employer] of Stay Meetings, dates discussed:    Additional Comments:  Zenon Mayo, RN 10/23/2015, 9:22 AM

## 2015-10-23 NOTE — Consult Note (Signed)
Requesting Physician: Dr. Irish Lack    Chief Complaint: Stroke status post cast  History obtained from:   Patient   HPI:                                                                                                                                         [name] is an 75 y.o. male who is currently in the hospital status post PCI to the RCA with DES yesterday. This morning wife noted that the patient had some increased confusion hour he doesn't dementia at baseline. In addition she also noted that he had a right facial droop and was having some word finding difficulty is. In further discussion he has been having word finding difficulties for quite a while. Currently patient's confusion has subsided however he still has a slight right facial droop. Due to these findings neurology was consult good for evaluation. Currently he is on aspirin and Plavix status post procedure. Patient's overnight vitals were stable with no drop in blood pressure. She is unable to have an MRI secondary to pacemaker. At this point no imaging of his brain has been obtained  During heart cath his left ventricular ejection fraction was noted to be 55-65%  Date last known well: Date: 10/22/2015 Time last known well: Unable to determine tPA Given: No: Minimal symptoms  Past Medical History:  Diagnosis Date  . Anxiety   . Arthritis    "knees, back, shoulders" (10/22/2015)  . CHB (complete heart block) ([place]) 02/2015   [name] 03/03/2015  . CHF (congestive heart failure) ([place])   . Chronic shoulder pain   . Heart attack 'many years ago'  . High cholesterol   . Hypertension   . OSA (obstructive sleep apnea)    "suppose to have a mask; they haven't got one adjusted for him yet" (10/22/2015)  . Pneumonia 01/2015  . Presence of permanent cardiac pacemaker   . Skin cancer of face    "had it cut off"    Past Surgical History:  Procedure Laterality Date  . CARDIAC CATHETERIZATION N/A 10/17/2015   Procedure: Left  Heart Cath and Coronary Angiography;  Surgeon: Jettie Booze, MD;  Location: [place] [facility];  Service: Cardiovascular;  Laterality: N/A;  . CARDIAC CATHETERIZATION N/A 10/22/2015   Procedure: Coronary Stent Intervention Rotablater;  Surgeon: Jettie Booze, MD;  Location: [place] [facility];  Service: Cardiovascular;  Laterality: N/A;  . CARDIAC CATHETERIZATION N/A 10/22/2015   Procedure: Left Heart Cath and Coronary Angiography;  Surgeon: Jettie Booze, MD;  Location: [place] [facility];  Service: Cardiovascular;  Laterality: N/A;  . CATARACT EXTRACTION W/ INTRAOCULAR LENS  IMPLANT, BILATERAL Bilateral 2016  . CORONARY ANGIOPLASTY  2012  . CORONARY ANGIOPLASTY WITH STENT PLACEMENT  2001  . CORONARY ANGIOPLASTY WITH STENT PLACEMENT  10/22/2015   "took 2 out and put 1 longer one in"  . EP  IMPLANTABLE DEVICE N/A 03/04/2015   Procedure: Pacemaker Implant;  Surgeon: Will Meredith Leeds, MD;  Location: [place] [facility];  Service: Cardiovascular;  Laterality: N/A;  . EP IMPLANTABLE DEVICE N/A 04/10/2015   Procedure: PPM Lead Revision/Repair;  Surgeon: Will Meredith Leeds, MD;  Location: [place] [facility];  Service: Cardiovascular;  Laterality: N/A;  . EYE SURGERY    . INSERT / REPLACE / REMOVE PACEMAKER    . RETINAL DETACHMENT SURGERY Bilateral    "several on each side"    Family History  Problem Relation Age of Onset  . Diabetes Sister   . Diabetes Brother    Social History:  reports that he has quit smoking. His smoking use included Cigarettes. He has a 10.00 pack-year smoking history. He has never used smokeless tobacco. He reports that he drinks alcohol. He reports that he does not use drugs.  Allergies:  Allergies  Allergen Reactions  . Novocain [Procaine] Other (See Comments)    Hallucinations  . Rosuvastatin Calcium Other (See Comments)    Whole body ache/pain  . Statins Other (See Comments)    Whole body ache/pain    Medications:                                                                                                                            Scheduled: . aspirin  81 mg Oral Daily  . clopidogrel  75 mg Oral Q breakfast  . escitalopram  10 mg Oral Daily  . isosorbide mononitrate  30 mg Oral Daily  . levothyroxine  25 mcg Oral QAC breakfast  . metoprolol succinate  50 mg Oral Daily  . multivitamin with minerals  1 tablet Oral Daily  . ramipril  2.5 mg Oral QHS  . sodium chloride flush  3 mL Intravenous Q12H   Continuous:   ROS:                                                                                                                                       History obtained from the patient  General ROS: negative for - chills, fatigue, fever, night sweats, weight gain or weight loss Psychological ROS: negative for - behavioral disorder, hallucinations, memory difficulties, mood swings or suicidal ideation Ophthalmic ROS: negative for - blurry vision, double vision, eye pain or loss of vision ENT ROS: negative for - epistaxis, nasal discharge,  oral lesions, sore throat, tinnitus or vertigo Allergy and Immunology ROS: negative for - hives or itchy/watery eyes Hematological and Lymphatic ROS: negative for - bleeding problems, bruising or swollen lymph nodes Endocrine ROS: negative for - galactorrhea, hair pattern changes, polydipsia/polyuria or temperature intolerance Respiratory ROS: negative for - cough, hemoptysis, shortness of breath or wheezing Cardiovascular ROS: negative for - chest pain, dyspnea on exertion, edema or irregular heartbeat Gastrointestinal ROS: negative for - abdominal pain, diarrhea, hematemesis, nausea/vomiting or stool incontinence Genito-Urinary ROS: negative for - dysuria, hematuria, incontinence or urinary frequency/urgency Musculoskeletal ROS: negative for - joint swelling or muscular weakness Neurological ROS: as noted in HPI Dermatological ROS: negative for rash and skin lesion changes  Neurologic  Examination:                                                                                                      Blood pressure (!) 144/80, pulse 67, temperature 98.2 F (36.8 C), temperature source Oral, resp. rate (!) 22, height 6' (1.829 m), weight 100 kg (220 lb 7.4 oz), SpO2 94 %.  HEENT-  Normocephalic, no lesions, without obvious abnormality.  Normal external eye and conjunctiva.  Normal TM's bilaterally.  Normal auditory canals and external ears. Normal external nose, mucus membranes and septum.  Normal pharynx. Cardiovascular- S1, S2 normal, pulses palpable throughout   Lungs- chest clear, no wheezing, rales, normal symmetric air entry Abdomen- normal findings: bowel sounds normal Extremities- no edema Lymph-no adenopathy palpable Musculoskeletal-no joint tenderness, deformity or swelling Skin-warm and dry, no hyperpigmentation, vitiligo, or suspicious lesions  Neurological Examination Mental Status: Alert, oriented to place, year but not president. Patient has a baseline of dementia. It is normal for him to not be able list name presidents.  Speech fluent without evidence of aphasia.  Able to follow 3 step commands without difficulty. Cranial Nerves: II: Patient has long-standing history of retinal detachment and visual issues. Patient's vision is at baseline however he has significant central vision with lack of peripheral vision again this is baseline, pupils equal, round, minimally reactive to light  III,IV, VI: ptosis not present, extra-ocular motions intact bilaterally V,VII: Smile is symmetrical and at this point cannot see a notable facial droop at rest, facial light touch sensation normal bilaterally VIII: hearing normal bilaterally IX,X: uvula rises symmetrically XI: bilateral shoulder shrug XII: midline tongue extension Motor: Right : Upper extremity   5/5    Left:     Upper extremity   5/5  Lower extremity   5/5     Lower extremity   5/5 Tone and bulk:normal tone  throughout; no atrophy noted Sensory: Pinprick and light touch intact throughout, bilaterally Deep Tendon Reflexes: 2+ and symmetric throughout, 2+ KJ with 1+ AJ Plantars: Right: downgoing   Left: downgoing Cerebellar: normal finger-to-nose Gait: Not tested       Lab Results: Basic Metabolic Panel:  Recent Labs Lab 10/16/15 1453 10/23/15 0341  NA 137 139  K 3.9 3.7  CL 100 107  CO2 27 27  GLUCOSE 190* 120*  BUN 12 12  CREATININE 1.37* 1.31*  CALCIUM 9.4 8.8*    Liver Function Tests: No results for input(s): AST, ALT, ALKPHOS, BILITOT, PROT, ALBUMIN in the last 168 hours. No results for input(s): LIPASE, AMYLASE in the last 168 hours. No results for input(s): AMMONIA in the last 168 hours.  CBC:  Recent Labs Lab 10/16/15 1453 10/23/15 0341  WBC 4.7 7.0  NEUTROABS 2,444  --   HGB 14.1 12.3*  HCT 40.9 35.1*  MCV 94.5 91.6  PLT 174 143*    Cardiac Enzymes: No results for input(s): CKTOTAL, CKMB, CKMBINDEX, TROPONINI in the last 168 hours.  Lipid Panel: No results for input(s): CHOL, TRIG, HDL, CHOLHDL, VLDL, LDLCALC in the last 168 hours.  CBG: No results for input(s): GLUCAP in the last 168 hours.  Microbiology: Results for orders placed or performed during the hospital encounter of 04/10/15  Surgical pcr screen     Status: None   Collection Time: 04/10/15  6:35 AM  Result Value Ref Range Status   MRSA, PCR NEGATIVE NEGATIVE Final   Staphylococcus aureus NEGATIVE NEGATIVE Final    Comment:        The Xpert SA Assay (FDA approved for NASAL specimens in patients over 58 years of age), is one component of a comprehensive surveillance program.  Test performance has been validated by [facility] for patients greater than or equal to 3 year old. It is not intended to diagnose infection nor to guide or monitor treatment.     Coagulation Studies: No results for input(s): LABPROT, INR in the last 72 hours.  Imaging: No results  found.     Assessment and plan discussed with with attending physician and they are in agreement.    Etta Quill PA-C [facility] [phone]  10/23/2015, 11:23 AM   Assessment: 75 y.o. male with right facial droop and word findign difficulty after cardiac catheterization. 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  Stroke Risk Factors - hyperlipidemia and hypertension  1) CTA head and neck 2) frequent neuro checks 3) MRI brain (apparently [facility] Jude device is MRI conditional) 4) already on dual antiplatelet therapy 5) lipid panel, A1c 6) Stroke team to follow.   Roland Rack, MD [facility] [phone]  If 7pm- 7am, please page neurology on call as listed in [place].

## 2015-10-23 NOTE — Progress Notes (Addendum)
DAILY PROGRESS NOTE  Subjective:  Feels well today - no dyspnea. Wife noted that he was more confused today and speech was garbled. He has had some word-finding difficulty. BP elevated overnight.  Objective:  Temp:  [98.2 F (36.8 C)-98.8 F (37.1 C)] 98.2 F (36.8 C) (10/12 0823) Pulse Rate:  [0-88] 67 (10/12 0823) Resp:  [0-23] 22 (10/12 0823) BP: (132-168)/(75-112) 144/80 (10/12 0823) SpO2:  [0 %-98 %] 94 % (10/12 0823) Weight:  [220 lb 7.4 oz (100 kg)] 220 lb 7.4 oz (100 kg) (10/12 0423) Weight change:   Intake/Output from previous day: 10/11 0701 - 10/12 0700 In: 640.9 [P.O.:240; I.V.:400.9] Out: 800 [Urine:800]  Intake/Output from this shift: No intake/output data recorded.  Medications: No current facility-administered medications on file prior to encounter.    Current Outpatient Prescriptions on File Prior to Encounter  Medication Sig Dispense Refill  . aspirin EC 81 MG tablet Take 81 mg by mouth daily.    Marland Kitchen escitalopram (LEXAPRO) 10 MG tablet Take 10 mg by mouth daily.    . isosorbide mononitrate (IMDUR) 30 MG 24 hr tablet Take 1 tablet (30 mg total) by mouth daily. 30 tablet 11  . metoprolol succinate (TOPROL XL) 50 MG 24 hr tablet Take 1 tablet (50 mg total) by mouth daily. Take with or immediately following a meal. 90 tablet 3  . Multiple Vitamin (MULTIVITAMIN WITH MINERALS) TABS tablet Take 1 tablet by mouth daily as needed (takes occasionally).     . nitroGLYCERIN (NITROSTAT) 0.4 MG SL tablet Place 0.4 mg under the tongue daily as needed for chest pain. Chest pain    . Omega-3 1000 MG CAPS Take 1 g by mouth daily as needed (takes occasionally).     . ramipril (ALTACE) 2.5 MG capsule Take 2.5 mg by mouth at bedtime.     . torsemide (DEMADEX) 20 MG tablet Take 20 mg by mouth at bedtime.       Physical Exam: General appearance: alert, no distress and mild left facial droop Neck: no carotid bruit and no JVD Lungs: clear to auscultation bilaterally Heart:  regular rate and rhythm Abdomen: soft, non-tender; bowel sounds normal; no masses,  no organomegaly Extremities: extremities normal, atraumatic, no cyanosis or edema Pulses: 2+ and symmetric Skin: Skin color, texture, turgor normal. No rashes or lesions Neurologic: Mental status: Alert, oriented, thought content appropriate, somewhat soft speech, left facial droop, grip strength and cranial nerves otherwise intact Psych: Pleasant  Lab Results: Results for orders placed or performed during the hospital encounter of 10/22/15 (from the past 48 hour(s))  POCT Activated clotting time     Status: None   Collection Time: 10/22/15  1:15 PM  Result Value Ref Range   Activated Clotting Time 241 seconds  POCT Activated clotting time     Status: None   Collection Time: 10/22/15  1:37 PM  Result Value Ref Range   Activated Clotting Time 263 seconds  POCT Activated clotting time     Status: None   Collection Time: 10/22/15  2:08 PM  Result Value Ref Range   Activated Clotting Time 252 seconds  POCT Activated clotting time     Status: None   Collection Time: 10/22/15  3:30 PM  Result Value Ref Range   Activated Clotting Time 235 seconds  POCT Activated clotting time     Status: None   Collection Time: 10/22/15  4:30 PM  Result Value Ref Range   Activated Clotting Time 202 seconds  POCT Activated  clotting time     Status: None   Collection Time: 10/22/15  5:25 PM  Result Value Ref Range   Activated Clotting Time 181 seconds  Basic metabolic panel     Status: Abnormal   Collection Time: 10/23/15  3:41 AM  Result Value Ref Range   Sodium 139 135 - 145 mmol/L   Potassium 3.7 3.5 - 5.1 mmol/L   Chloride 107 101 - 111 mmol/L   CO2 27 22 - 32 mmol/L   Glucose, Bld 120 (H) 65 - 99 mg/dL   BUN 12 6 - 20 mg/dL   Creatinine, Ser 1.31 (H) 0.61 - 1.24 mg/dL   Calcium 8.8 (L) 8.9 - 10.3 mg/dL   GFR calc non Af Amer 51 (L) >60 mL/min   GFR calc Af Amer 59 (L) >60 mL/min    Comment: (NOTE) The eGFR  has been calculated using the [name] EPI equation. This calculation has not been validated in all clinical situations. eGFR's persistently <60 mL/min signify possible Chronic Kidney Disease.    Anion gap 5 5 - 15  CBC     Status: Abnormal   Collection Time: 10/23/15  3:41 AM  Result Value Ref Range   WBC 7.0 4.0 - 10.5 K/uL   RBC 3.83 (L) 4.22 - 5.81 MIL/uL   Hemoglobin 12.3 (L) 13.0 - 17.0 g/dL   HCT 35.1 (L) 39.0 - 52.0 %   MCV 91.6 78.0 - 100.0 fL   MCH 32.1 26.0 - 34.0 pg   MCHC 35.0 30.0 - 36.0 g/dL   RDW 13.2 11.5 - 15.5 %   Platelets 143 (L) 150 - 400 K/uL    Imaging: No results found.  Assessment:  Principal Problem:   Accelerating angina (HCC) Active Problems:   SOB (shortness of breath)   S/P cardiac pacemaker procedure   S/P primary angioplasty with coronary stent   Plan:  1. Successful rotablation and PCI to the RCA with DES yesterday. Wife noted some confusion and garbled speech today and there is left facial droop on exam. Suspect this is post-procedure stroke. D/w Dr. Leonel Ramsay with neurology and he will evaluate. He has a Med[employer]onic Assurity DR pacemaker - this is not the Assurity DR MRI-safe model, which should be considered with MRI. Disposition will be based on neurology recommendations, but appears stable from a cardiac standpoint. May need to allow permissive hypertension initially.  ADDENDUM: Discussed with [employer] sales rep - the pacemaker is safe for 1.5T magnets, should neurology wish to pursue this.  Time Spent Directly with Patient:  15 minutes  Length of Stay:  LOS: 0 days   Pixie Casino, MD, [facility] Attending Cardiologist [place] 10/23/2015, 10:12 AM

## 2015-10-23 NOTE — Progress Notes (Signed)
CARDIAC REHAB PHASE I   PRE:  Rate/Rhythm: 65 paced  BP:  Sitting: 144/80        SaO2: 96 RA  MODE:  Ambulation: 300 ft   POST:  Rate/Rhythm: 90 paced  BP:  Sitting: 161/93         SaO2: 97 RA  Pt ambulated 300 ft on RA, handheld assist, fairly steady gait, tolerated well with no complaints. Completed PCI/stent education with pt and wife at bedside.  Reviewed risk factors, PCI book, anti-platelet therapy, stent card, activity restrictions, ntg, exercise, heart healthy diet,and phase 2 cardiac rehab. Pt and wife verbalized understanding. Pt agrees to phase 2 cardiac rehab referral, will send to [facility] per pt request. Pt to bed per pt request after walk, call bell within reach.   [id number] Lenna Sciara, RN, BSN 10/23/2015 10:00 AM

## 2015-10-23 NOTE — Progress Notes (Signed)
RN received report from [facility], Patient arrived via staff with belongings and wife at bedside. Vitals stable, tele applied, oriented to unit. Continue to monitor patient.

## 2015-10-24 DIAGNOSIS — Z950 Presence of cardiac pacemaker: Secondary | ICD-10-CM

## 2015-10-24 DIAGNOSIS — R4182 Altered mental status, unspecified: Secondary | ICD-10-CM | POA: Diagnosis not present

## 2015-10-24 DIAGNOSIS — Z955 Presence of coronary angioplasty implant and graft: Secondary | ICD-10-CM

## 2015-10-24 DIAGNOSIS — I6330 Cerebral infarction due to thrombosis of unspecified cerebral artery: Secondary | ICD-10-CM | POA: Diagnosis present

## 2015-10-24 DIAGNOSIS — Z8679 Personal history of other diseases of the circulatory system: Secondary | ICD-10-CM | POA: Diagnosis not present

## 2015-10-24 DIAGNOSIS — I10 Essential (primary) hypertension: Secondary | ICD-10-CM | POA: Diagnosis not present

## 2015-10-24 DIAGNOSIS — Z7901 Long term (current) use of anticoagulants: Secondary | ICD-10-CM | POA: Diagnosis not present

## 2015-10-24 DIAGNOSIS — R29720 NIHSS score 20: Secondary | ICD-10-CM | POA: Diagnosis present

## 2015-10-24 DIAGNOSIS — E785 Hyperlipidemia, unspecified: Secondary | ICD-10-CM | POA: Diagnosis present

## 2015-10-24 DIAGNOSIS — I5032 Chronic diastolic (congestive) heart failure: Secondary | ICD-10-CM | POA: Diagnosis present

## 2015-10-24 DIAGNOSIS — Z888 Allergy status to other drugs, medicaments and biological substances status: Secondary | ICD-10-CM | POA: Diagnosis not present

## 2015-10-24 DIAGNOSIS — I200 Unstable angina: Secondary | ICD-10-CM | POA: Diagnosis not present

## 2015-10-24 DIAGNOSIS — G4733 Obstructive sleep apnea (adult) (pediatric): Secondary | ICD-10-CM | POA: Diagnosis present

## 2015-10-24 DIAGNOSIS — R29810 Facial weakness: Secondary | ICD-10-CM | POA: Diagnosis present

## 2015-10-24 DIAGNOSIS — I25110 Atherosclerotic heart disease of native coronary artery with unstable angina pectoris: Secondary | ICD-10-CM | POA: Diagnosis present

## 2015-10-24 DIAGNOSIS — I110 Hypertensive heart disease with heart failure: Secondary | ICD-10-CM | POA: Diagnosis present

## 2015-10-24 DIAGNOSIS — R0602 Shortness of breath: Secondary | ICD-10-CM | POA: Diagnosis present

## 2015-10-24 LAB — HEMOGLOBIN A1C
HEMOGLOBIN A1C: 6.1 % — AB (ref 4.8–5.6)
Mean Plasma Glucose: 128 mg/dL

## 2015-10-24 LAB — LIPID PANEL
Cholesterol: 140 mg/dL (ref 0–200)
HDL: 31 mg/dL — ABNORMAL LOW (ref >40)
LDL Cholesterol: 88 mg/dL (ref 0–99)
Total CHOL/HDL Ratio: 4.5 RATIO
Triglycerides: 106 mg/dL (ref <150)
VLDL: 21 mg/dL (ref 0–40)

## 2015-10-24 MED ORDER — CYANOCOBALAMIN 1000 MCG/ML IJ SOLN
1000.0000 ug | Freq: Once | INTRAMUSCULAR | Status: AC
Start: 2015-10-24 — End: 2015-10-24
  Administered 2015-10-24: 1000 ug via INTRAMUSCULAR
  Filled 2015-10-24: qty 1

## 2015-10-24 MED ORDER — VITAMIN B-12 1000 MCG PO TABS
1000.0000 ug | ORAL_TABLET | Freq: Every day | ORAL | Status: DC
  Administered 2015-10-25: 1000 ug via ORAL
  Filled 2015-10-24: qty 1

## 2015-10-24 NOTE — Evaluation (Signed)
Physical Therapy Evaluation Patient Details Name: [name] MRN: [id number] DOB: 09/14/1940 Today's Date: 10/24/2015   History of Present Illness  [name] is an 75 y.o. male who is currently in the hospital status post PCI to the RCA with DES 10/22/15. On 10/12, wife noted that the patient had some increased confusion and she also noted that he had a right facial droop and was having some word finding difficulty.  PMH includes OA, CHF, pacemaker placement  Clinical Impression  Pt admitted with above diagnosis. Pt currently with functional limitations due to the deficits listed below (see PT Problem List). Pt will benefit from skilled PT to increase their independence and safety with mobility to allow discharge to the venue listed below.  Pt able to ambulate with min/guard to S on level surface and stairs with min/guard.  Pt's wife reports he has been demonstrating increased forgetfulness over last 6 months and recommended 24 hour S, which she states they can provide.  No HH services or DME needed and wife in agreement.  Will follow acutely.     Follow Up Recommendations No PT follow up;Supervision/Assistance - 24 hour    Equipment Recommendations  None recommended by PT    Recommendations for Other Services       Precautions / Restrictions Precautions Precautions: Fall Restrictions Weight Bearing Restrictions: No      Mobility  Bed Mobility Overal bed mobility: Modified Independent             General bed mobility comments: bed flat  Transfers Overall transfer level: Needs assistance Equipment used: None Transfers: Sit to/from Stand Sit to Stand: Supervision         General transfer comment: S for safety  Ambulation/Gait Ambulation/Gait assistance: Min guard;Supervision Ambulation Distance (Feet): 240 Feet Assistive device: None Gait Pattern/deviations: Step-through pattern;Wide base of support Gait velocity: slightly decreased   General Gait  Details: Min/guard initially and progressed to S with gait with step through pattern with no AD or UE support. Wife pleased with his gait pattern  Stairs Stairs: Yes Stairs assistance: Min guard Stair Management: Two rails;Step to pattern;Forwards Number of Stairs: 4 General stair comments: Encouraged pt to do step to pattern for safety   Wheelchair Mobility    Modified Rankin (Stroke Patients Only) Modified Rankin (Stroke Patients Only) Pre-Morbid Rankin Score: No symptoms Modified Rankin: No significant disability     Balance Overall balance assessment: Needs assistance   Sitting balance-Leahy Scale: Good       Standing balance-Leahy Scale: Good                               Pertinent Vitals/Pain Pain Assessment: Faces Faces Pain Scale: Hurts little more Pain Location: shoulders with coordination testing Pain Descriptors / Indicators: Sore Pain Intervention(s): Limited activity within patient's tolerance    Home Living Family/patient expects to be discharged to:: Private residence Living Arrangements: Spouse/significant other (daughter lives next door) Available Help at Discharge: Family;Available 24 hours/day Type of Home: Mobile home Home Access: Stairs to enter Entrance Stairs-Rails: Can reach both Entrance Stairs-Number of Steps: 4 Home Layout: One level Home Equipment: None      Prior Function Level of Independence: Independent               Hand Dominance        Extremity/Trunk Assessment   Upper Extremity Assessment: Overall WFL for tasks assessed (decreased shoulder ROM due to arthritis)  Lower Extremity Assessment: Overall WFL for tasks assessed      Cervical / Trunk Assessment: Normal  Communication   Communication: No difficulties  Cognition Arousal/Alertness: Awake/alert Behavior During Therapy: WFL for tasks assessed/performed Overall Cognitive Status: Within Functional Limits for tasks assessed        Memory: Decreased short-term memory   Safety/Judgement: Decreased awareness of safety     General Comments: Wife reports she has noticed increased difficulty with memory last 6 months    General Comments      Exercises     Assessment/Plan    PT Assessment Patient needs continued PT services  PT Problem List Decreased activity tolerance;Decreased balance;Decreased mobility          PT Treatment Interventions Gait training;Stair training;Balance training;Therapeutic exercise;Therapeutic activities;Patient/family education    PT Goals (Current goals can be found in the Care Plan section)  Acute Rehab PT Goals Patient Stated Goal: to go home PT Goal Formulation: With patient/family Time For Goal Achievement: 10/31/15 Potential to Achieve Goals: Good    Frequency Min 3X/week   Barriers to discharge        Co-evaluation               End of Session Equipment Utilized During Treatment: Gait belt Activity Tolerance: Patient tolerated treatment well Patient left: in bed;with call bell/phone within reach;with bed alarm set;with nursing/sitter in room Nurse Communication: Mobility status         Time: [id number] PT Time Calculation (min) (ACUTE ONLY): 28 min   Charges:   PT Evaluation $PT Eval Low Complexity: 1 Procedure PT Treatments $Gait Training: 8-22 mins   PT G Codes:        Marqui Formby LUBECK 10/24/2015, 11:26 AM

## 2015-10-24 NOTE — Progress Notes (Signed)
STROKE TEAM PROGRESS NOTE   HISTORY OF PRESENT ILLNESS (per record) [name] is an 75 y.o. male who is currently in the hospital status post PCI to the RCA with DES 10/22/2015. This morning wife noted that the patient had some increased confusion hour he doesn't dementia at baseline. In addition she also noted that he had a right facial droop and was having some word finding difficulty is. In further discussion he has been having word finding difficulties for quite a while. Currently patient's confusion has subsided however he still has a slight right facial droop. Due to these findings neurology was consult good for evaluation. Currently he is on aspirin and Plavix status post procedure. Patient's overnight vitals were stable with no drop in blood pressure. He is unable to have an MRI secondary to pacemaker. At this point no imaging of his brain has been obtained  During heart cath his left ventricular ejection fraction was noted to be 55-65%  Date last known well: Date: 10/22/2015 Time last known well: Unable to determine tPA Given: No: Minimal symptoms   SUBJECTIVE (INTERVAL HISTORY) His wife is at the bedside.  Overall he feels his condition is gradually improving. He still has left facial droop. Wife stated that pt has memory difficulty for a while, had cardiac cath last Friday. And starting Saturday, he had confusion. Had again cardiac cath and stent this Wednesday (the day before yesterday), and then developed left facial droop and word finding difficulties.    OBJECTIVE Temp:  [97.9 F (36.6 C)-99.5 F (37.5 C)] 97.9 F (36.6 C) (10/13 0720) Pulse Rate:  [58-84] 62 (10/13 0720) Cardiac Rhythm: Atrial paced (10/12 2110) Resp:  [16-24] 18 (10/13 0720) BP: (102-144)/(45-80) 141/78 (10/13 0720) SpO2:  [94 %-97 %] 96 % (10/13 0720) Weight:  [101.2 kg (223 lb 1.7 oz)] 101.2 kg (223 lb 1.7 oz) (10/12 2108)  CBC:  Recent Labs Lab 10/23/15 0341  WBC 7.0  HGB 12.3*  HCT  35.1*  MCV 91.6  PLT 143*    Basic Metabolic Panel:  Recent Labs Lab 10/23/15 0341  NA 139  K 3.7  CL 107  CO2 27  GLUCOSE 120*  BUN 12  CREATININE 1.31*  CALCIUM 8.8*    Lipid Panel:    Component Value Date/Time   CHOL 140 10/24/2015 0437   TRIG 106 10/24/2015 0437   HDL 31 (L) 10/24/2015 0437   CHOLHDL 4.5 10/24/2015 0437   VLDL 21 10/24/2015 0437   LDLCALC 88 10/24/2015 0437   HgbA1c:  Lab Results  Component Value Date   HGBA1C 6.1 (H) 10/23/2015   Urine Drug Screen: No results found for: LABOPIA, COCAINSCRNUR, LABBENZ, AMPHETMU, THCU, LABBARB    IMAGING I have personally reviewed the radiological images below and agree with the radiology interpretations.  Ct Angio Head and Neck W Or Wo Contrast 10/23/2015 No significant atherosclerotic disease in the neck. Mild plaque at both carotid bifurcations but no stenosis. Atherosclerotic disease in both carotid siphon regions. 50-70% supraclinoid ICA stenosis on right. Right ICA supplies along the right middle cerebral artery territory. There is some atherosclerotic irregularity within the M1 and M2 branches as well. 30% stenosis at the left vertebral artery origin, not flow limiting. No intracranial posterior circulation disease identified.   CT repeat Pending  Pacemaker interrogation - no afib  TTE 09/26/15 - Left ventricle: The cavity size was normal. Wall thickness was   increased in a pattern of mild LVH. Systolic function was normal.   The estimated  ejection fraction was in the range of 55% to 60%.   Wall motion was normal; there were no regional wall motion   abnormalities. Doppler parameters are consistent with abnormal   left ventricular relaxation (grade 1 diastolic dysfunction). - Aortic root: The aortic root was mildly dilated. - Mitral valve: Calcified annulus. Impressions: - Normal LV systolic function; grade 1 diastolic dysfunction;   trace TR.  Cardiac cath 10/22/15  Dist RCA lesion, 90  %stenosed. RPDA lesion, 90 %stenosed. Rotational atherectomy was performed on both vessels with 1.5 and 1.75 burrs.  A STENT SYNERGY DES 2.75X28 drug eluting stent was successfully placed- covering both lesions, postdilated with a 3.5 balloon.  Post intervention, there is a 0% residual stenosis.  LV end diastolic pressure is normal.  There is no aortic valve stenosis.  Cardiac cath 10/17/15  Mid LAD lesion, 80 %stenosed, extending into the Ost 2nd Diag lesion, 90 %stenosed. The diagonal is larger than the continuation of the LAD.  Dist RCA lesion, 90 %stenosed.  RPDA lesion, 90 %stenosed. This appears to be instent restenosis. We do not have records of where his prior stents were placed.  THe RCA and PDA lesions are likely the culprit for his symptoms.  Ost Cx to Mid Cx lesion, 25 %stenosed.  The left ventricular systolic function is normal.  The left ventricular ejection fraction is 55-65% by visual estimate.  There is no aortic valve stenosis.   PHYSICAL EXAM  Temp:  [97.9 F (36.6 C)-99.5 F (37.5 C)] 97.9 F (36.6 C) (10/13 0930) Pulse Rate:  [58-84] 72 (10/13 0930) Resp:  [16-24] 20 (10/13 0930) BP: (100-141)/(45-78) 100/68 (10/13 0930) SpO2:  [94 %-97 %] 96 % (10/13 0930) Weight:  [223 lb 1.7 oz (101.2 kg)] 223 lb 1.7 oz (101.2 kg) (10/12 2108)  General - Well nourished, well developed, in no apparent distress.  Ophthalmologic - Fundi not visualized due to not cooperative.  Cardiovascular - Regular rate and rhythm.  Mental Status -  Level of arousal and orientation to year, place, and person were intact, but not orientated to month. Language including expression, repetition, comprehension was assessed and found intact, however mild word finding difficulty, with hesitancy of speech and naming 3/4. Following one step commands but not 2-step commands.  Cranial Nerves II - XII - II - Visual field intact OU. III, IV, VI - Extraocular movements intact. V - Facial  sensation intact bilaterally. VII - left facial droop. VIII - Hearing & vestibular intact bilaterally. X - Palate elevates symmetrically. XI - Chin turning & shoulder shrug intact bilaterally. XII - Tongue protrusion intact.  Motor Strength - The patient's strength was normal in all extremities and pronator drift was absent.  Bulk was normal and fasciculations were absent.   Motor Tone - Muscle tone was assessed at the neck and appendages and was normal.  Reflexes - The patient's reflexes were 1+ in all extremities and he had no pathological reflexes.  Sensory - Light touch, temperature/pinprick were assessed and were symmetrical.    Coordination - The patient had normal movements in the hands with no ataxia or dysmetria.  Tremor was absent.  Gait and Station - not tested due to safety concerns.    ASSESSMENT/PLAN [name] is a 75 y.o. 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 He did not receive IV  t-PA due to minimal deficits.  Suspected Stroke:  possibly embolic involving bilateral hemisphere due to cardiac procedure x 2.  Resultant  Left facial droop, mild word finding difficulty  MRI - not performed secondary to permanent pacemaker.  MRA - not performed secondary to permanent pacemaker  CTA head and neck - athero including b/l siphon and left VA origin  CT repeat pending  2D Echo 55-60% in 09/2015  Cardiac cath - multivessel CAD s/p stent  Pacemaker interrogation - no afib  LDL - 88  HgbA1c - 6.1  VTE prophylaxis - SCDs  Diet Heart Room service appropriate? Yes; Fluid consistency: Thin  aspirin 81 mg daily and clopidogrel 75 mg daily prior to admission, now on aspirin 81 mg daily and clopidogrel 75 mg daily.    Patient counseled to be compliant with his antithrombotic medications  Ongoing aggressive stroke risk factor management  Therapy recommendations: No follow-up physical therapy recommended. OT evaluation pending.  Disposition:  Pending  Hypertension  Blood pressure tends to run low at times.  Permissive hypertension (OK if < 220/120) but gradually normalize in 5-7 days  Long-term BP goal normotensive  Hyperlipidemia  Home meds:  No lipid lowering medications prior to admission  LDL 88,  goal < 70  Statin allergy  Consider PCSK9 inhibitors as outpt with PCP follow up  Other Stroke Risk Factors  Advanced age  Former smoker. No longer smokes.  ETOH use, advised to drink no more than 1 - 2 drink(s) a day  Obesity, Body mass index is 30.26 kg/m., recommend weight loss, diet and exercise as appropriate   Coronary artery disease s/p stent placement 10/22/2015  Obstructive sleep apnea  Other Active Problems   Hospital day # 0  Rosalin Hawking, MD PhD Stroke Neurology 10/24/2015 3:10 PM    To contact Stroke Continuity provider, please refer to [email or web address] After hours, contact General Neurology

## 2015-10-24 NOTE — Progress Notes (Signed)
  DAILY PROGRESS NOTE  Subjective:  Some gait difficulty today. Speech has improved. No chest pain. Appreciate neurology evaluation. CTA head and neck yesterday did show some atherosclerotic disease of the ICA, but not flow-limiting. No large stroke findings by CT. MRI was not pursued, however, please note the addendum to my rounding note yesterday that I reviewed it with the [employer] pacemaker rep and although it was not "tested", it has been evaluated and considered safe for up to 1.5T of the head. Radiology cancelled MRI due to suggestions on the [employer] website that it was not tested.  Objective:  Temp:  [97.9 F (36.6 C)-99.5 F (37.5 C)] 97.9 F (36.6 C) (10/13 0720) Pulse Rate:  [58-84] 62 (10/13 0720) Resp:  [16-24] 18 (10/13 0720) BP: (102-144)/(45-80) 141/78 (10/13 0720) SpO2:  [94 %-97 %] 96 % (10/13 0720) Weight:  [223 lb 1.7 oz (101.2 kg)] 223 lb 1.7 oz (101.2 kg) (10/12 2108) Weight change: 3 lb 1.7 oz (1.409 kg)  Intake/Output from previous day: 10/12 0701 - 10/13 0700 In: 240 [P.O.:240] Out: 670 [Urine:670]  Intake/Output from this shift: No intake/output data recorded.  Medications: No current facility-administered medications on file prior to encounter.    Current Outpatient Prescriptions on File Prior to Encounter  Medication Sig Dispense Refill  . aspirin EC 81 MG tablet Take 81 mg by mouth daily.    . escitalopram (LEXAPRO) 10 MG tablet Take 10 mg by mouth daily.    . isosorbide mononitrate (IMDUR) 30 MG 24 hr tablet Take 1 tablet (30 mg total) by mouth daily. 30 tablet 11  . metoprolol succinate (TOPROL XL) 50 MG 24 hr tablet Take 1 tablet (50 mg total) by mouth daily. Take with or immediately following a meal. 90 tablet 3  . Multiple Vitamin (MULTIVITAMIN WITH MINERALS) TABS tablet Take 1 tablet by mouth daily as needed (takes occasionally).     . nitroGLYCERIN (NITROSTAT) 0.4 MG SL tablet Place 0.4 mg under the tongue daily as needed for chest pain.  Chest pain    . Omega-3 1000 MG CAPS Take 1 g by mouth daily as needed (takes occasionally).     . ramipril (ALTACE) 2.5 MG capsule Take 2.5 mg by mouth at bedtime.     . torsemide (DEMADEX) 20 MG tablet Take 20 mg by mouth at bedtime.       Physical Exam: General appearance: alert, no distress and mild right facial droop Neck: no carotid bruit and no JVD Lungs: clear to auscultation bilaterally Heart: regular rate and rhythm Abdomen: soft, non-tender; bowel sounds normal; no masses,  no organomegaly Extremities: extremities normal, atraumatic, no cyanosis or edema Pulses: 2+ and symmetric Skin: Skin color, texture, turgor normal. No rashes or lesions Neurologic: Mental status: Alert, oriented, thought content appropriate, somewhat soft speech, left facial droop, grip strength and cranial nerves otherwise intact Psych: Pleasant  Lab Results: Results for orders placed or performed during the hospital encounter of 10/22/15 (from the past 48 hour(s))  POCT Activated clotting time     Status: None   Collection Time: 10/22/15  1:15 PM  Result Value Ref Range   Activated Clotting Time 241 seconds  POCT Activated clotting time     Status: None   Collection Time: 10/22/15  1:37 PM  Result Value Ref Range   Activated Clotting Time 263 seconds  POCT Activated clotting time     Status: None   Collection Time: 10/22/15  2:08 PM  Result Value Ref Range     Activated Clotting Time 252 seconds  POCT Activated clotting time     Status: None   Collection Time: 10/22/15  3:30 PM  Result Value Ref Range   Activated Clotting Time 235 seconds  POCT Activated clotting time     Status: None   Collection Time: 10/22/15  4:30 PM  Result Value Ref Range   Activated Clotting Time 202 seconds  POCT Activated clotting time     Status: None   Collection Time: 10/22/15  5:25 PM  Result Value Ref Range   Activated Clotting Time 181 seconds  Basic metabolic panel     Status: Abnormal   Collection Time:  10/23/15  3:41 AM  Result Value Ref Range   Sodium 139 135 - 145 mmol/L   Potassium 3.7 3.5 - 5.1 mmol/L   Chloride 107 101 - 111 mmol/L   CO2 27 22 - 32 mmol/L   Glucose, Bld 120 (H) 65 - 99 mg/dL   BUN 12 6 - 20 mg/dL   Creatinine, Ser 1.31 (H) 0.61 - 1.24 mg/dL   Calcium 8.8 (L) 8.9 - 10.3 mg/dL   GFR calc non Af Amer 51 (L) >60 mL/min   GFR calc Af Amer 59 (L) >60 mL/min    Comment: (NOTE) The eGFR has been calculated using the [name] EPI equation. This calculation has not been validated in all clinical situations. eGFR's persistently <60 mL/min signify possible Chronic Kidney Disease.    Anion gap 5 5 - 15  CBC     Status: Abnormal   Collection Time: 10/23/15  3:41 AM  Result Value Ref Range   WBC 7.0 4.0 - 10.5 K/uL   RBC 3.83 (L) 4.22 - 5.81 MIL/uL   Hemoglobin 12.3 (L) 13.0 - 17.0 g/dL   HCT 35.1 (L) 39.0 - 52.0 %   MCV 91.6 78.0 - 100.0 fL   MCH 32.1 26.0 - 34.0 pg   MCHC 35.0 30.0 - 36.0 g/dL   RDW 13.2 11.5 - 15.5 %   Platelets 143 (L) 150 - 400 K/uL  Hemoglobin A1c     Status: Abnormal   Collection Time: 10/23/15 11:59 AM  Result Value Ref Range   Hgb A1c MFr Bld 6.1 (H) 4.8 - 5.6 %    Comment: (NOTE)         Pre-diabetes: 5.7 - 6.4         Diabetes: >6.4         Glycemic control for adults with diabetes: <7.0    Mean Plasma Glucose 128 mg/dL    Comment: (NOTE) Performed At: [facility] [place], [place] [id number] Lindon Romp MD [id number]   Blood gas, arterial     Status: Abnormal   Collection Time: 10/23/15  4:50 PM  Result Value Ref Range   FIO2 0.21    pH, Arterial 7.461 (H) 7.350 - 7.450   pCO2 arterial 35.3 32.0 - 48.0 mmHg   pO2, Arterial 67.4 (L) 83.0 - 108.0 mmHg   Bicarbonate 24.8 20.0 - 28.0 mmol/L   Acid-Base Excess 1.4 0.0 - 2.0 mmol/L   O2 Saturation 94.4 %   Patient temperature 98.6    Collection site RIGHT RADIAL    Drawn by [id number]    Sample type ARTERIAL DRAW    Allens test (pass/fail) PASS PASS    Vitamin B12     Status: None   Collection Time: 10/23/15  7:08 PM  Result Value Ref Range   Vitamin B-12 209 180 -  914 pg/mL    Comment: (NOTE) This assay is not validated for testing neonatal or myeloproliferative syndrome specimens for Vitamin B12 levels.   TSH     Status: None   Collection Time: 10/23/15  7:08 PM  Result Value Ref Range   TSH 1.948 0.350 - 4.500 uIU/mL    Comment: Performed by a 3rd Generation assay with a functional sensitivity of <=0.01 uIU/mL.  Lipid panel     Status: Abnormal   Collection Time: 10/24/15  4:37 AM  Result Value Ref Range   Cholesterol 140 0 - 200 mg/dL   Triglycerides 106 <150 mg/dL   HDL 31 (L) >40 mg/dL   Total CHOL/HDL Ratio 4.5 RATIO   VLDL 21 0 - 40 mg/dL   LDL Cholesterol 88 0 - 99 mg/dL    Comment:        Total Cholesterol/HDL:CHD Risk Coronary Heart Disease Risk Table                     Men   Women  1/2 Average Risk   3.4   3.3  Average Risk       5.0   4.4  2 X Average Risk   9.6   7.1  3 X Average Risk  23.4   11.0        Use the calculated Patient Ratio above and the CHD Risk Table to determine the patient's CHD Risk.        ATP III CLASSIFICATION (LDL):  <100     mg/dL   Optimal  100-129  mg/dL   Near or Above                    Optimal  130-159  mg/dL   Borderline  160-189  mg/dL   High  >190     mg/dL   Very High     Imaging: Ct Angio Head W Or Wo Contrast  Result Date: 10/23/2015 CLINICAL DATA:  Patient would dementia. Worsening confusion this morning. Right facial droop and speech disturbance. Negative noncontrast head CT. EXAM: CT ANGIOGRAPHY HEAD AND NECK TECHNIQUE: Multidetector CT imaging of the head and neck was performed using the standard protocol during bolus administration of intravenous contrast. Multiplanar CT image reconstructions and MIPs were obtained to evaluate the vascular anatomy. Carotid stenosis measurements (when applicable) are obtained utilizing NASCET criteria, using the distal internal  carotid diameter as the denominator. CONTRAST:  50 cc Isovue 370 COMPARISON:  03/14/2015 FINDINGS: CT HEAD FINDINGS Brain: Background pattern of brain atrophy, could relate prominent in the temporal lobes. No sign of acute infarction. There are mild chronic small-vessel ischemic changes, most evident in the left basal ganglia/ external capsule region. No large vessel territory infarction. No mass lesion, hemorrhage, hydrocephalus or extra-axial collection. Vascular: There is atherosclerotic calcification of the major vessels at the base of the brain. Skull: Negative Sinuses: Mild mucosal inflammatory changes of the ethmoid sinuses Orbits: Negative Review of the MIP images confirms the above findings CTA NECK FINDINGS Aortic arch: Aortic atherosclerosis without dissection or pronounced irregularity. Branching pattern of the brachiocephalic vessels from the arch is normal. Right carotid system: Common carotid artery widely patent to the bifurcation. Mild atherosclerotic calcification of the carotid bifurcation but no stenosis of the ICA when compared to the more distal cervical ICA. Cervical ICA is normal. Left carotid system: Common carotid artery widely patent to the bifurcation. Atherosclerotic calcification at the carotid bifurcation but no stenosis. Cervical internal carotid artery   widely patent. Vertebral arteries: Both vertebral artery origins are patent. There is 30% stenosis on the left secondary to calcified plaque. Beyond that, both vertebral arteries are widely patent through the cervical region Skeleton: Ordinary mild spondylosis Other neck: No soft tissue lesion Upper chest: Mild emphysema.  No focal or active process. Review of the MIP images confirms the above findings CTA HEAD FINDINGS Anterior circulation: Both internal carotid arteries widely patent through the skullbase and siphon regions. Mild calcification in the carotid siphons but no flow-limiting stenosis. The left ICA supplies the left middle  cerebral artery territory and both anterior cerebral arteries. No stenosis in that region. The right ICA shows a stenosis of the supraclinoid portion estimated at 50-70%. This vessel supplies only the middle cerebral artery territory. There is some atherosclerotic irregularity of the M1 segment but no stenosis in that region. Posterior circulation: Both vertebral arteries are patent to the basilar. No basilar stenosis. Posterior circulation branch vessels are patent. Venous sinuses: Patent and normal Anatomic variants: None significant Delayed phase: No abnormal enhancement Review of the MIP images confirms the above findings IMPRESSION: No significant atherosclerotic disease in the neck. Mild plaque at both carotid bifurcations but no stenosis. Atherosclerotic disease in both carotid siphon regions. 50-70% supraclinoid ICA stenosis on right. Right ICA supplies along the right middle cerebral artery territory. There is some atherosclerotic irregularity within the M1 and M2 branches as well. 30% stenosis at the left vertebral artery origin, not flow limiting. No intracranial posterior circulation disease identified. Electronically Signed   By: Mark  Shogry M.D.   On: 10/23/2015 14:27   Ct Angio Neck W Or Wo Contrast  Result Date: 10/23/2015 CLINICAL DATA:  Patient would dementia. Worsening confusion this morning. Right facial droop and speech disturbance. Negative noncontrast head CT. EXAM: CT ANGIOGRAPHY HEAD AND NECK TECHNIQUE: Multidetector CT imaging of the head and neck was performed using the standard protocol during bolus administration of intravenous contrast. Multiplanar CT image reconstructions and MIPs were obtained to evaluate the vascular anatomy. Carotid stenosis measurements (when applicable) are obtained utilizing NASCET criteria, using the distal internal carotid diameter as the denominator. CONTRAST:  50 cc Isovue 370 COMPARISON:  03/14/2015 FINDINGS: CT HEAD FINDINGS Brain: Background pattern  of brain atrophy, could relate prominent in the temporal lobes. No sign of acute infarction. There are mild chronic small-vessel ischemic changes, most evident in the left basal ganglia/ external capsule region. No large vessel territory infarction. No mass lesion, hemorrhage, hydrocephalus or extra-axial collection. Vascular: There is atherosclerotic calcification of the major vessels at the base of the brain. Skull: Negative Sinuses: Mild mucosal inflammatory changes of the ethmoid sinuses Orbits: Negative Review of the MIP images confirms the above findings CTA NECK FINDINGS Aortic arch: Aortic atherosclerosis without dissection or pronounced irregularity. Branching pattern of the brachiocephalic vessels from the arch is normal. Right carotid system: Common carotid artery widely patent to the bifurcation. Mild atherosclerotic calcification of the carotid bifurcation but no stenosis of the ICA when compared to the more distal cervical ICA. Cervical ICA is normal. Left carotid system: Common carotid artery widely patent to the bifurcation. Atherosclerotic calcification at the carotid bifurcation but no stenosis. Cervical internal carotid artery widely patent. Vertebral arteries: Both vertebral artery origins are patent. There is 30% stenosis on the left secondary to calcified plaque. Beyond that, both vertebral arteries are widely patent through the cervical region Skeleton: Ordinary mild spondylosis Other neck: No soft tissue lesion Upper chest: Mild emphysema.  No focal or active process.   Review of the MIP images confirms the above findings CTA HEAD FINDINGS Anterior circulation: Both internal carotid arteries widely patent through the skullbase and siphon regions. Mild calcification in the carotid siphons but no flow-limiting stenosis. The left ICA supplies the left middle cerebral artery territory and both anterior cerebral arteries. No stenosis in that region. The right ICA shows a stenosis of the supraclinoid  portion estimated at 50-70%. This vessel supplies only the middle cerebral artery territory. There is some atherosclerotic irregularity of the M1 segment but no stenosis in that region. Posterior circulation: Both vertebral arteries are patent to the basilar. No basilar stenosis. Posterior circulation branch vessels are patent. Venous sinuses: Patent and normal Anatomic variants: None significant Delayed phase: No abnormal enhancement Review of the MIP images confirms the above findings IMPRESSION: No significant atherosclerotic disease in the neck. Mild plaque at both carotid bifurcations but no stenosis. Atherosclerotic disease in both carotid siphon regions. 50-70% supraclinoid ICA stenosis on right. Right ICA supplies along the right middle cerebral artery territory. There is some atherosclerotic irregularity within the M1 and M2 branches as well. 30% stenosis at the left vertebral artery origin, not flow limiting. No intracranial posterior circulation disease identified. Electronically Signed   By: Nelson Chimes M.D.   On: 10/23/2015 14:27    Assessment:  Principal Problem:   Accelerating angina [facility]) Active Problems:   History of complete heart block   DOE (dyspnea on exertion)   S/P [facility] PPM Feb 2017   CAD-S/P remote PCI 20 yrs ago, ? 4 years ago, and s/p RCA PCI/DES 10/22/15   Acute on chronic alteration in mental status post PCI   Cerebral thrombosis with cerebral infarction   Plan:  1. Doing well post-PCI. Ok to d/c home later today from a cardiac standpoint. Wife is concerned about his gait. Clinically appears to have had a stroke. Will ask PT/OT to see today. D/W Radiology and Dr. Leonel Ramsay - pacer placed this year - although not stamped "MRI safe", same model as current [employer] MRI model and considered safe per Shearon Stalls, [employer] Orthoptist. Would try to proceed with head MRI today.  Time Spent Directly with Patient:  15 minutes  Length of Stay:  LOS: 0 days    Pixie Casino, MD, [facility] Attending Cardiologist [place] 10/24/2015, 8:04 AM

## 2015-10-24 NOTE — Progress Notes (Signed)
Pt's wife at bedside appears forgetful at time. RN updated POC with daughter Butch Penny. Please call Butch Penny at [phone] for any updates or change in POC.   Ave Filter, RN

## 2015-10-24 NOTE — Progress Notes (Signed)
CARDIAC REHAB PHASE I   Pt sleeping, spoke with pt wife at bedside. She states she has no questions regarding yesterday's education, however, she is eager for PT evaluation. Will hold ambulation with cardiac rehab this morning in anticipation of PT evaluation. Will follow up tomorrow for ambulation if pt does not discharge today.   Lenna Sciara, RN, BSN 10/24/2015 9:46 AM

## 2015-10-24 NOTE — Evaluation (Signed)
Occupational Therapy Evaluation and Discharge Patient Details Name: [name] MRN: [id number] DOB: 1940-08-22 Today's Date: 10/24/2015    History of Present Illness [name] is an 75 y.o. male who is currently in the hospital status post PCI to the RCA with DES 10/22/15. On 10/12, wife noted that the patient had some increased confusion and she also noted that he had a right facial droop and was having some word finding difficulty.  PMH includes OA, CHF, pacemaker placement   Clinical Impression   Pt reports he was independent with ADL PTA. Currently pt overall supervision for safety with ADL and functional mobility. Educated pt and wife on signs and symptoms of stroke. Pt planning to d/c home with 24/7 supervision from family. No further acute OT needs identified; signing off at this time. Please re-consult if needs change. Thank you for this referral.    Follow Up Recommendations  No OT follow up;Supervision/Assistance - 24 hour    Equipment Recommendations  None recommended by OT    Recommendations for Other Services       Precautions / Restrictions Precautions Precautions: Fall Restrictions Weight Bearing Restrictions: No      Mobility Bed Mobility               General bed mobility comments: Pt OOB upon arrival.  Transfers Overall transfer level: Needs assistance Equipment used: None Transfers: Sit to/from Stand Sit to Stand: Supervision         General transfer comment: Supervision for safety. No unsteadiness or LOB.    Balance Overall balance assessment: No apparent balance deficits (not formally assessed)   Sitting balance-Leahy Scale: Good       Standing balance-Leahy Scale: Good                              ADL Overall ADL's : Needs assistance/impaired Eating/Feeding: Independent;Sitting   Grooming: Supervision/safety;Standing   Upper Body Bathing: Supervision/ safety;Standing   Lower Body Bathing:  Supervison/ safety;Sit to/from stand   Upper Body Dressing : Supervision/safety;Sitting;Standing   Lower Body Dressing: Supervision/safety;Sit to/from stand Lower Body Dressing Details (indicate cue type and reason): Pt able to adjust socks in sitting. Toilet Transfer: Supervision/safety;Ambulation;Comfort height toilet Toilet Transfer Details (indicate cue type and reason): Simulated by sit to stand from chair with functional mobility. Toileting- Clothing Manipulation and Hygiene: Supervision/safety;Sit to/from stand       Functional mobility during ADLs: Supervision/safety General ADL Comments: Pt able to perform functional mobility and higher level balance activities without LOB or unsteadiness. Educated pt and wife on signs and symptoms of stroke and need to return to hospital if experiencing stroke symptoms.      Vision Additional Comments: Pt reports he had difficulty with vision in L eye PTA. No changes from baseline.   Perception     Praxis      Pertinent Vitals/Pain Pain Assessment: No/denies pain     Hand Dominance Right   Extremity/Trunk Assessment Upper Extremity Assessment Upper Extremity Assessment: Overall WFL for tasks assessed   Lower Extremity Assessment Lower Extremity Assessment: Defer to PT evaluation   Cervical / Trunk Assessment Cervical / Trunk Assessment: Normal   Communication Communication Communication: No difficulties   Cognition Arousal/Alertness: Awake/alert Behavior During Therapy: WFL for tasks assessed/performed Overall Cognitive Status: Within Functional Limits for tasks assessed                 General Comments: Wife reports increased memory deficits  over the past few months. Currently at baseline from PTA.   General Comments       Exercises       Shoulder Instructions      Home Living Family/patient expects to be discharged to:: Private residence Living Arrangements: Spouse/significant other Available Help at  Discharge: Family;Available 24 hours/day Type of Home: Mobile home Home Access: Stairs to enter Entrance Stairs-Number of Steps: 4 Entrance Stairs-Rails: Can reach both Home Layout: One level     Bathroom Shower/Tub: Occupational psychologist: Handicapped height     Home Equipment: None          Prior Functioning/Environment Level of Independence: Independent                 OT Problem List: Decreased cognition   OT Treatment/Interventions:      OT Goals(Current goals can be found in the care plan section) Acute Rehab OT Goals Patient Stated Goal: go home OT Goal Formulation: All assessment and education complete, DC therapy  OT Frequency:     Barriers to D/C:            Co-evaluation              End of Session Nurse Communication: Mobility status  Activity Tolerance: Patient tolerated treatment well Patient left: in chair;with family/visitor present   Time: 1415-1433 OT Time Calculation (min): 18 min Charges:  OT General Charges $OT Visit: 1 Procedure OT Evaluation $OT Eval Moderate Complexity: 1 Procedure G-Codes:     Binnie Kand M.S., OTR/L PagerJN:[id number]  10/24/2015, 3:22 PM

## 2015-10-25 ENCOUNTER — Encounter (HOSPITAL_COMMUNITY): Payer: Self-pay | Admitting: Cardiology

## 2015-10-25 ENCOUNTER — Inpatient Hospital Stay (HOSPITAL_COMMUNITY): Payer: Medicare Other

## 2015-10-25 DIAGNOSIS — Z9861 Coronary angioplasty status: Secondary | ICD-10-CM

## 2015-10-25 DIAGNOSIS — R4182 Altered mental status, unspecified: Secondary | ICD-10-CM

## 2015-10-25 NOTE — Progress Notes (Signed)
CARDIAC REHAB PHASE I   PRE:  Rate/Rhythm: A paced 66  BP:    Sitting: 122/60     SaO2: 96% on room air   MODE:  Ambulation: 350 ft   POST:  Rate/Rhythem: 66  BP:    Sitting: 142/60     SaO2: 97% on room air  1305-1330 Patient fully dressed, anxious to go home. Ambulated with assistance times one, gait slightly unsteady. Tolerated well. Patient assisted back to recliner with call bell within reach. Family present.Harrell Gave RN BSN

## 2015-10-25 NOTE — Progress Notes (Signed)
STROKE TEAM PROGRESS NOTE   SUBJECTIVE (INTERVAL HISTORY) His wife is at the bedside.  CT repeat showed no acute abnormalities. Pt walked with PT/OT well and no further recommendation from them. Pt is sleepy during rounds after walked with PT/OT.     OBJECTIVE Temp:  [97.9 F (36.6 C)-98.8 F (37.1 C)] 98.8 F (37.1 C) (10/14 0500) Pulse Rate:  [59-86] 86 (10/14 0500) Cardiac Rhythm: Atrial paced (10/13 1900) Resp:  [18-20] 20 (10/14 0500) BP: (100-146)/(68-92) 146/92 (10/14 0500) SpO2:  [95 %-98 %] 96 % (10/14 0500)  CBC:   Recent Labs Lab 10/23/15 0341  WBC 7.0  HGB 12.3*  HCT 35.1*  MCV 91.6  PLT 143*    Basic Metabolic Panel:   Recent Labs Lab 10/23/15 0341  NA 139  K 3.7  CL 107  CO2 27  GLUCOSE 120*  BUN 12  CREATININE 1.31*  CALCIUM 8.8*    Lipid Panel:     Component Value Date/Time   CHOL 140 10/24/2015 0437   TRIG 106 10/24/2015 0437   HDL 31 (L) 10/24/2015 0437   CHOLHDL 4.5 10/24/2015 0437   VLDL 21 10/24/2015 0437   LDLCALC 88 10/24/2015 0437   HgbA1c:  Lab Results  Component Value Date   HGBA1C 6.1 (H) 10/23/2015   Urine Drug Screen: No results found for: LABOPIA, COCAINSCRNUR, LABBENZ, AMPHETMU, THCU, LABBARB    IMAGING I have personally reviewed the radiological images below and agree with the radiology interpretations.  Ct Angio Head and Neck W Or Wo Contrast 10/23/2015 No significant atherosclerotic disease in the neck. Mild plaque at both carotid bifurcations but no stenosis. Atherosclerotic disease in both carotid siphon regions. 50-70% supraclinoid ICA stenosis on right. Right ICA supplies along the right middle cerebral artery territory. There is some atherosclerotic irregularity within the M1 and M2 branches as well. 30% stenosis at the left vertebral artery origin, not flow limiting. No intracranial posterior circulation disease identified.   Ct Head Wo Contrast 10/25/2015 IMPRESSION: No acute intracranial abnormality is  identified. Stable mild chronic microvascular ischemic changes and parenchymal volume loss for age.   Pacemaker interrogation - no afib  TTE 09/26/15 - Left ventricle: The cavity size was normal. Wall thickness was   increased in a pattern of mild LVH. Systolic function was normal.   The estimated ejection fraction was in the range of 55% to 60%.   Wall motion was normal; there were no regional wall motion   abnormalities. Doppler parameters are consistent with abnormal   left ventricular relaxation (grade 1 diastolic dysfunction). - Aortic root: The aortic root was mildly dilated. - Mitral valve: Calcified annulus. Impressions: - Normal LV systolic function; grade 1 diastolic dysfunction;   trace TR.  Cardiac cath 10/22/15  Dist RCA lesion, 90 %stenosed. RPDA lesion, 90 %stenosed. Rotational atherectomy was performed on both vessels with 1.5 and 1.75 burrs.  A STENT SYNERGY DES [id number] drug eluting stent was successfully placed- covering both lesions, postdilated with a 3.5 balloon.  Post intervention, there is a 0% residual stenosis.  LV end diastolic pressure is normal.  There is no aortic valve stenosis.  Cardiac cath 10/17/15  Mid LAD lesion, 80 %stenosed, extending into the Ost 2nd Diag lesion, 90 %stenosed. The diagonal is larger than the continuation of the LAD.  Dist RCA lesion, 90 %stenosed.  RPDA lesion, 90 %stenosed. This appears to be instent restenosis. We do not have records of where his prior stents were placed.  THe RCA and PDA lesions  are likely the culprit for his symptoms.  Ost Cx to Mid Cx lesion, 25 %stenosed.  The left ventricular systolic function is normal.  The left ventricular ejection fraction is 55-65% by visual estimate.  There is no aortic valve stenosis.   PHYSICAL EXAM  Temp:  [97.9 F (36.6 C)-98.8 F (37.1 C)] 98.8 F (37.1 C) (10/14 0500) Pulse Rate:  [59-86] 86 (10/14 0500) Resp:  [18-20] 20 (10/14 0500) BP: (100-146)/(68-92)  146/92 (10/14 0500) SpO2:  [95 %-98 %] 96 % (10/14 0500)  General - Well nourished, well developed, in no apparent distress.  Ophthalmologic - Fundi not visualized due to not cooperative.  Cardiovascular - Regular rate and rhythm.  Mental Status -  Level of arousal and orientation to year, place, and person were intact, but not orientated to month. Language including expression, repetition, comprehension was assessed and found intact, however mild word finding difficulty, with hesitancy of speech and naming 3/4. Following one step commands but not 2-step commands.  Cranial Nerves II - XII - II - Visual field intact OU. III, IV, VI - Extraocular movements intact. V - Facial sensation intact bilaterally. VII - mild left facial droop. VIII - Hearing & vestibular intact bilaterally. X - Palate elevates symmetrically. XI - Chin turning & shoulder shrug intact bilaterally. XII - Tongue protrusion intact.  Motor Strength - The patient's strength was normal in all extremities and pronator drift was absent.  Bulk was normal and fasciculations were absent.   Motor Tone - Muscle tone was assessed at the neck and appendages and was normal.  Reflexes - The patient's reflexes were 1+ in all extremities and he had no pathological reflexes.  Sensory - Light touch, temperature/pinprick were assessed and were symmetrical.    Coordination - The patient had normal movements in the hands with no ataxia or dysmetria.  Tremor was absent.  Gait and Station - not tested due to safety concerns.    ASSESSMENT/PLAN [name] is a 75 y.o. 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 He did not receive IV t-PA due to minimal deficits.  Suspected stroke:  possibly  embolic involving bilateral hemisphere due to cardiac procedure x 2.  Resultant  Left facial droop, mild word finding difficulty  MRI - not performed secondary to permanent pacemaker.  MRA - not performed secondary to permanent pacemaker  CTA head and neck - athero including b/l siphon and left VA origin  CT repeat no acute abnormalities  2D Echo 55-60% in 09/2015  Cardiac cath - multivessel CAD s/p stent  Pacemaker interrogation - no afib  LDL - 88  HgbA1c - 6.1  VTE prophylaxis - SCDs Diet Heart Room service appropriate? Yes; Fluid consistency: Thin  aspirin 81 mg daily and clopidogrel 75 mg daily prior to admission, now on aspirin 81 mg daily and clopidogrel 75 mg daily. Continue DAPT on discharge  Patient counseled to be compliant with his antithrombotic medications  Ongoing aggressive stroke risk factor management  Therapy recommendations: 24 h supervision.  Disposition:  Pending  Hypertension  stable  Long-term BP goal normotensive  Hyperlipidemia  Home meds:  No lipid lowering medications prior to admission  LDL 88,  goal < 70  Statin allergy  Consider PCSK9 inhibitors as outpt with PCP follow up  Other Stroke Risk Factors  Advanced age  Former smoker. No longer smokes.  ETOH use, advised to drink no more than 1 - 2 drink(s) a day  Obesity, Body mass index is 30.26 kg/m., recommend weight loss, diet and exercise as appropriate   Coronary artery disease s/p stent placement 10/22/2015  Obstructive sleep apnea  Other Active Problems   Hospital day # 1  Neurology will sign off. Please call with questions. No neuro follow up needed at this time. Thanks for the consult.  Rosalin Hawking, MD PhD Stroke Neurology 10/25/2015 11:49 AM   To contact Stroke Continuity provider, please refer to [email or web address] After hours, contact General Neurology

## 2015-10-25 NOTE — Progress Notes (Signed)
Discharge orders received.  Discharge instructions and follow-up appointments reviewed with the patient and his wife.  VSS upon discharge.  IV removed and education complete.  All belongings sent with the patient.  Transported out via wheelchair.   Xanthe Couillard M, RN 

## 2015-10-25 NOTE — Progress Notes (Signed)
SUBJECTIVE: The patient is doing well today.  At this time, he denies chest pain, shortness of breath, or any new concerns.  Confusion is slowly improving.  No new neuro symptoms.   Marland Kitchen aspirin  81 mg Oral Daily  . clopidogrel  75 mg Oral Q breakfast  . escitalopram  10 mg Oral Daily  . isosorbide mononitrate  30 mg Oral Daily  . levothyroxine  25 mcg Oral QAC breakfast  . metoprolol succinate  50 mg Oral Daily  . multivitamin with minerals  1 tablet Oral Daily  . ramipril  2.5 mg Oral QHS  . sodium chloride flush  3 mL Intravenous Q12H  . vitamin B-12  1,000 mcg Oral Daily      OBJECTIVE: Physical Exam: Vitals:   10/25/15 0100 10/25/15 0500 10/25/15 0907 10/25/15 1340  BP: 130/79 (!) 146/92 131/74 (!) 101/54  Pulse: (!) 59 86 68 65  Resp: 18 20 18 20   Temp: 98.7 F (37.1 C) 98.8 F (37.1 C) 98.8 F (37.1 C) 98.3 F (36.8 C)  TempSrc: Oral Oral Oral Oral  SpO2: 95% 96% 98% 97%  Weight:      Height:        Intake/Output Summary (Last 24 hours) at 10/25/15 1400 Last data filed at 10/24/15 1830  Gross per 24 hour  Intake              360 ml  Output                0 ml  Net              360 ml    Telemetry reveals sinus rhythm  GEN- The patient is well appearing, alert and oriented x 3 today.   Head- normocephalic, atraumatic Eyes-  Sclera clear, conjunctiva pink Ears- hearing intact Oropharynx- clear Neck- supple,  Lungs- Clear to ausculation bilaterally, normal work of breathing Heart- Regular rate and rhythm, no murmurs, rubs or gallops, PMI not laterally displaced GI- soft, NT, ND, + BS Extremities- no clubbing, cyanosis, or edema Skin- no rash or lesion Psych- euthymic mood, full affect Neuro- strength and sensation are intact  LABS: Basic Metabolic Panel:  Recent Labs  10/23/15 0341  NA 139  K 3.7  CL 107  CO2 27  GLUCOSE 120*  BUN 12  CREATININE 1.31*  CALCIUM 8.8*   Liver Function Tests: No results for input(s): AST, ALT, ALKPHOS,  BILITOT, PROT, ALBUMIN in the last 72 hours. No results for input(s): LIPASE, AMYLASE in the last 72 hours. CBC:  Recent Labs  10/23/15 0341  WBC 7.0  HGB 12.3*  HCT 35.1*  MCV 91.6  PLT 143*   Cardiac Enzymes: No results for input(s): CKTOTAL, CKMB, CKMBINDEX, TROPONINI in the last 72 hours. BNP: Invalid input(s): POCBNP D-Dimer: No results for input(s): DDIMER in the last 72 hours. Hemoglobin A1C:  Recent Labs  10/23/15 1159  HGBA1C 6.1*   Fasting Lipid Panel:  Recent Labs  10/24/15 0437  CHOL 140  HDL 31*  LDLCALC 88  TRIG 106  CHOLHDL 4.5   Thyroid Function Tests:  Recent Labs  10/23/15 1908  TSH 1.948   Anemia Panel:  Recent Labs  10/23/15 1908  VITAMINB12 209    RADIOLOGY: Ct Angio Head W Or Wo Contrast  Result Date: 10/23/2015 CLINICAL DATA:  Patient would dementia. Worsening confusion this morning. Right facial droop and speech disturbance. Negative noncontrast head CT. EXAM: CT ANGIOGRAPHY HEAD AND NECK TECHNIQUE: Multidetector CT imaging  of the head and neck was performed using the standard protocol during bolus administration of intravenous contrast. Multiplanar CT image reconstructions and MIPs were obtained to evaluate the vascular anatomy. Carotid stenosis measurements (when applicable) are obtained utilizing NASCET criteria, using the distal internal carotid diameter as the denominator. CONTRAST:  50 cc Isovue 370 COMPARISON:  03/14/2015 FINDINGS: CT HEAD FINDINGS Brain: Background pattern of brain atrophy, could relate prominent in the temporal lobes. No sign of acute infarction. There are mild chronic small-vessel ischemic changes, most evident in the left basal ganglia/ external capsule region. No large vessel territory infarction. No mass lesion, hemorrhage, hydrocephalus or extra-axial collection. Vascular: There is atherosclerotic calcification of the major vessels at the base of the brain. Skull: Negative Sinuses: Mild mucosal inflammatory  changes of the ethmoid sinuses Orbits: Negative Review of the MIP images confirms the above findings CTA NECK FINDINGS Aortic arch: Aortic atherosclerosis without dissection or pronounced irregularity. Branching pattern of the brachiocephalic vessels from the arch is normal. Right carotid system: Common carotid artery widely patent to the bifurcation. Mild atherosclerotic calcification of the carotid bifurcation but no stenosis of the ICA when compared to the more distal cervical ICA. Cervical ICA is normal. Left carotid system: Common carotid artery widely patent to the bifurcation. Atherosclerotic calcification at the carotid bifurcation but no stenosis. Cervical internal carotid artery widely patent. Vertebral arteries: Both vertebral artery origins are patent. There is 30% stenosis on the left secondary to calcified plaque. Beyond that, both vertebral arteries are widely patent through the cervical region Skeleton: Ordinary mild spondylosis Other neck: No soft tissue lesion Upper chest: Mild emphysema.  No focal or active process. Review of the MIP images confirms the above findings CTA HEAD FINDINGS Anterior circulation: Both internal carotid arteries widely patent through the skullbase and siphon regions. Mild calcification in the carotid siphons but no flow-limiting stenosis. The left ICA supplies the left middle cerebral artery territory and both anterior cerebral arteries. No stenosis in that region. The right ICA shows a stenosis of the supraclinoid portion estimated at 50-70%. This vessel supplies only the middle cerebral artery territory. There is some atherosclerotic irregularity of the M1 segment but no stenosis in that region. Posterior circulation: Both vertebral arteries are patent to the basilar. No basilar stenosis. Posterior circulation branch vessels are patent. Venous sinuses: Patent and normal Anatomic variants: None significant Delayed phase: No abnormal enhancement Review of the MIP images  confirms the above findings IMPRESSION: No significant atherosclerotic disease in the neck. Mild plaque at both carotid bifurcations but no stenosis. Atherosclerotic disease in both carotid siphon regions. 50-70% supraclinoid ICA stenosis on right. Right ICA supplies along the right middle cerebral artery territory. There is some atherosclerotic irregularity within the M1 and M2 branches as well. 30% stenosis at the left vertebral artery origin, not flow limiting. No intracranial posterior circulation disease identified. Electronically Signed   By: Nelson Chimes M.D.   On: 10/23/2015 14:27   Ct Head Wo Contrast  Result Date: 10/25/2015 CLINICAL DATA:  75 y/o  M; stroke follow-up. EXAM: CT HEAD WITHOUT CONTRAST TECHNIQUE: Contiguous axial images were obtained from the base of the skull through the vertex without intravenous contrast. COMPARISON:  10/23/2015 CT head and CT angiogram head and neck. FINDINGS: Brain: No evidence of acute infarction, hemorrhage, hydrocephalus, extra-axial collection or mass lesion/mass effect. Stable mild chronic microvascular ischemic changes and parenchymal volume loss. Vascular: Calcific atherosclerosis of cavernous internal carotid arteries. No hyperdense vessel identified. Skull: Normal. Negative for fracture or  focal lesion. Sinuses/Orbits: Partial opacification of left-sided ethmoid air cells and mild mucosal thickening of left sphenoid sinus. Bilateral intra-ocular lens replacement. Other: None. IMPRESSION: No acute intracranial abnormality is identified. Stable mild chronic microvascular ischemic changes and parenchymal volume loss for age. Electronically Signed   By: Kristine Garbe M.D.   On: 10/25/2015 04:04   Ct Angio Neck W Or Wo Contrast  Result Date: 10/23/2015 CLINICAL DATA:  Patient would dementia. Worsening confusion this morning. Right facial droop and speech disturbance. Negative noncontrast head CT. EXAM: CT ANGIOGRAPHY HEAD AND NECK TECHNIQUE:  Multidetector CT imaging of the head and neck was performed using the standard protocol during bolus administration of intravenous contrast. Multiplanar CT image reconstructions and MIPs were obtained to evaluate the vascular anatomy. Carotid stenosis measurements (when applicable) are obtained utilizing NASCET criteria, using the distal internal carotid diameter as the denominator. CONTRAST:  50 cc Isovue 370 COMPARISON:  03/14/2015 FINDINGS: CT HEAD FINDINGS Brain: Background pattern of brain atrophy, could relate prominent in the temporal lobes. No sign of acute infarction. There are mild chronic small-vessel ischemic changes, most evident in the left basal ganglia/ external capsule region. No large vessel territory infarction. No mass lesion, hemorrhage, hydrocephalus or extra-axial collection. Vascular: There is atherosclerotic calcification of the major vessels at the base of the brain. Skull: Negative Sinuses: Mild mucosal inflammatory changes of the ethmoid sinuses Orbits: Negative Review of the MIP images confirms the above findings CTA NECK FINDINGS Aortic arch: Aortic atherosclerosis without dissection or pronounced irregularity. Branching pattern of the brachiocephalic vessels from the arch is normal. Right carotid system: Common carotid artery widely patent to the bifurcation. Mild atherosclerotic calcification of the carotid bifurcation but no stenosis of the ICA when compared to the more distal cervical ICA. Cervical ICA is normal. Left carotid system: Common carotid artery widely patent to the bifurcation. Atherosclerotic calcification at the carotid bifurcation but no stenosis. Cervical internal carotid artery widely patent. Vertebral arteries: Both vertebral artery origins are patent. There is 30% stenosis on the left secondary to calcified plaque. Beyond that, both vertebral arteries are widely patent through the cervical region Skeleton: Ordinary mild spondylosis Other neck: No soft tissue lesion  Upper chest: Mild emphysema.  No focal or active process. Review of the MIP images confirms the above findings CTA HEAD FINDINGS Anterior circulation: Both internal carotid arteries widely patent through the skullbase and siphon regions. Mild calcification in the carotid siphons but no flow-limiting stenosis. The left ICA supplies the left middle cerebral artery territory and both anterior cerebral arteries. No stenosis in that region. The right ICA shows a stenosis of the supraclinoid portion estimated at 50-70%. This vessel supplies only the middle cerebral artery territory. There is some atherosclerotic irregularity of the M1 segment but no stenosis in that region. Posterior circulation: Both vertebral arteries are patent to the basilar. No basilar stenosis. Posterior circulation branch vessels are patent. Venous sinuses: Patent and normal Anatomic variants: None significant Delayed phase: No abnormal enhancement Review of the MIP images confirms the above findings IMPRESSION: No significant atherosclerotic disease in the neck. Mild plaque at both carotid bifurcations but no stenosis. Atherosclerotic disease in both carotid siphon regions. 50-70% supraclinoid ICA stenosis on right. Right ICA supplies along the right middle cerebral artery territory. There is some atherosclerotic irregularity within the M1 and M2 branches as well. 30% stenosis at the left vertebral artery origin, not flow limiting. No intracranial posterior circulation disease identified. Electronically Signed   By: Jan Fireman.D.  On: 10/23/2015 14:27    ASSESSMENT AND PLAN:   1. CVA Clinically improved Likely embolic post cath Appreciated neuro input Will continue asa and plavix Pacemaker interrogated and does not reveal afib as the cause Unable to obtain MRI at this time due to PPM  2. CAD Doing well s/p PCI RCA 10/22/15 Continue medical therapy  3. S/p PPM Interrogation reviewed Normal pacemaker function  DC to home  today  Needs close outpatient follow-up with cardiology team in 1 week  Thompson Grayer, MD 10/25/2015 2:00 PM

## 2015-10-25 NOTE — Discharge Summary (Signed)
Discharge Summary    Patient ID: [name],  MRN: [id number], DOB/AGE: 1940/02/24 75 y.o.  Admit date: 10/22/2015 Discharge date: 10/25/2015  Primary Care Provider: Maris Berger Primary Cardiologist: Dr. 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 4 years ago, and s/p RCA PCI/DES 10/22/15   Acute on chronic alteration in mental status post PCI   Cerebral thrombosis with cerebral infarction   Facial weakness   S/P PTCA (percutaneous transluminal coronary angioplasty)   Hyperlipidemia   Essential hypertension   Allergies Allergies  Allergen Reactions  . Novocain [Procaine] Other (See Comments)    Hallucinations  . Rosuvastatin Calcium Other (See Comments)    Whole body ache/pain  . Statins Other (See Comments)    Whole body ache/pain    Diagnostic Studies/Procedures    LHC: 10/11  Conclusion     Dist RCA lesion, 90 %stenosed. RPDA lesion, 90 %stenosed. Rotational atherectomy was performed on both vessels with 1.5 and 1.75 burrs.  A STENT SYNERGY DES 2.75X28 drug eluting stent was successfully placed- covering both lesions, postdilated with a 3.5 balloon.  Post intervention, there is a 0% residual stenosis.  LV end diastolic pressure is normal.  There is no aortic valve stenosis.   Continue dual antiplatelet therapy for at least a year.  Continue aggressive secondary prevention.  Anticipate discharge in AM.      _____________   History of Present Illness     [name] is a 75 y.o. 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   He has a history of CAD wtih remote stents x2 about 20 years ago, reportedly had cath about 4 years ago with  what sounds PTCA (no reports in CareEverywhere that could be found). He had a normal echo and nuclear stress test in 01/2015.   He presented to [facility] in 02/2015 with syncope. He was found to have bradycardia with sinus arrest. The patient underwent implantation of a STJ dual chamber pacemaker. Since that time, he had a device check which showed R waves that had decreased from 17.9 to 3.7 mV. Lead revision performed on 04/10/15.  He saw Dr. Curt Bears on 07/02/15 and complained of SOB. An echocardiogram was ordered which showed normal LV function with no WMAs, mild LVH, G1DD, mildly dilated aortic root.   On 10/5 he presented to clinic for follow up. He had been having shortness of breath since pacemaker placement. But over the past three weeks he has been so short of breath with minimal activity. He has had to take take 3 SL NTG x3 weeks for chest pain with good relief. Before that he has had no chest pain. Chest pain comes out of nowhere and not necessarily related to exertion. No LE edema, orthopnea or PND. He has been sleeping a lot more lately. Having a lot of fatigue. Recently seen by PCP and thyroid and labs are good. No dizziness or syncope.   Given his report of symptoms he was sent to the hospital and prepped for LHC. He underwent cardiac cath with Dr. Irish Lack on 10/6 showing Mid LAD 80% stenosed extending into 2nd diag 90%, dist RCA 90% with RPDA  lesion 90% instent restenosed, LV was normal. He was started on Imdur and Plavix with a plan to bring back for intervention if unable to improve his anginal symptoms. His anginal symptoms did return shortly after having undergone cath.   Hospital Course     Consultants: Neurology  He was brought back for admission on 10/11 with Dr. Irish Lack with rotational atherectomy to both dis RCA and RPDA with DES to both areas.   Post intervention the following day his wife reported he seemed confusion and had garbled speech with left facial droop. There was concern  for post procedure stroke and neurology was asked to see. Recommendations were given for MRI but unable to do so 2/2 to his pacemaker. CTA head neck showed atherosclerosis including bilateral siphon and left VA origin. CT head on 10/14 showed no acute abnormalities. His confusion did improve but did continue to have mild word finding difficulty. Neurology recommended the continuation of DAPT with ASA 81 mg and Plavix 75mg  daily. It was felt his CVA was likely embolic post cath. His pacemaker was interrogated and showed no AF. Currently statin intolerant, so unable to add statin during this admission.   On 10/14 he was seen and assessed by Dr. Rayann Heman and determined stable for discharge home. I have sent a message to arrange follow up in the office in one week.  _____________  Discharge Vitals Blood pressure (!) 101/54, pulse 65, temperature 98.3 F (36.8 C), temperature source Oral, resp. rate 20, height 6' (1.829 m), weight 223 lb 1.7 oz (101.2 kg), SpO2 97 %.  Filed Weights   10/22/15 0932 10/23/15 0423 10/23/15 2108  Weight: 220 lb (99.8 kg) 220 lb 7.4 oz (100 kg) 223 lb 1.7 oz (101.2 kg)    Labs & Radiologic Studies    CBC  Recent Labs  10/23/15 0341  WBC 7.0  HGB 12.3*  HCT 35.1*  MCV 91.6  PLT [id number]*   Basic Metabolic Panel  Recent Labs  10/23/15 0341  NA 139  K 3.7  CL 107  CO2 27  GLUCOSE 120*  BUN 12  CREATININE 1.31*  CALCIUM 8.8*   Liver Function Tests No results for input(s): AST, ALT, ALKPHOS, BILITOT, PROT, ALBUMIN in the last 72 hours. No results for input(s): LIPASE, AMYLASE in the last 72 hours. Cardiac Enzymes No results for input(s): CKTOTAL, CKMB, CKMBINDEX, TROPONINI in the last 72 hours. BNP Invalid input(s): POCBNP D-Dimer No results for input(s): DDIMER in the last 72 hours. Hemoglobin A1C  Recent Labs  10/23/15 1159  HGBA1C 6.1*   Fasting Lipid Panel  Recent Labs  10/24/15 0437  CHOL 140  HDL 31*  LDLCALC 88  TRIG 106  CHOLHDL 4.5    Thyroid Function Tests  Recent Labs  10/23/15 1908  TSH 1.948   _____________  Ct Angio Head W Or Wo Contrast  Result Date: 10/23/2015 CLINICAL DATA:  Patient would dementia. Worsening confusion this morning. Right facial droop and speech disturbance. Negative noncontrast head CT. EXAM: CT ANGIOGRAPHY HEAD AND NECK TECHNIQUE: Multidetector CT imaging of the head and neck was performed using the standard protocol during bolus administration of intravenous contrast. Multiplanar CT image reconstructions and MIPs were obtained to evaluate the vascular anatomy. Carotid stenosis measurements (when applicable) are obtained utilizing NASCET criteria, using the distal internal carotid diameter as the denominator. CONTRAST:  50 cc Isovue 370 COMPARISON:  03/14/2015 FINDINGS: CT HEAD FINDINGS Brain: Background pattern of brain atrophy, could relate prominent in the temporal lobes. No sign  of acute infarction. There are mild chronic small-vessel ischemic changes, most evident in the left basal ganglia/ external capsule region. No large vessel territory infarction. No mass lesion, hemorrhage, hydrocephalus or extra-axial collection. Vascular: There is atherosclerotic calcification of the major vessels at the base of the brain. Skull: Negative Sinuses: Mild mucosal inflammatory changes of the ethmoid sinuses Orbits: Negative Review of the MIP images confirms the above findings CTA NECK FINDINGS Aortic arch: Aortic atherosclerosis without dissection or pronounced irregularity. Branching pattern of the brachiocephalic vessels from the arch is normal. Right carotid system: Common carotid artery widely patent to the bifurcation. Mild atherosclerotic calcification of the carotid bifurcation but no stenosis of the ICA when compared to the more distal cervical ICA. Cervical ICA is normal. Left carotid system: Common carotid artery widely patent to the bifurcation. Atherosclerotic calcification at the carotid bifurcation  but no stenosis. Cervical internal carotid artery widely patent. Vertebral arteries: Both vertebral artery origins are patent. There is 30% stenosis on the left secondary to calcified plaque. Beyond that, both vertebral arteries are widely patent through the cervical region Skeleton: Ordinary mild spondylosis Other neck: No soft tissue lesion Upper chest: Mild emphysema.  No focal or active process. Review of the MIP images confirms the above findings CTA HEAD FINDINGS Anterior circulation: Both internal carotid arteries widely patent through the skullbase and siphon regions. Mild calcification in the carotid siphons but no flow-limiting stenosis. The left ICA supplies the left middle cerebral artery territory and both anterior cerebral arteries. No stenosis in that region. The right ICA shows a stenosis of the supraclinoid portion estimated at 50-70%. This vessel supplies only the middle cerebral artery territory. There is some atherosclerotic irregularity of the M1 segment but no stenosis in that region. Posterior circulation: Both vertebral arteries are patent to the basilar. No basilar stenosis. Posterior circulation branch vessels are patent. Venous sinuses: Patent and normal Anatomic variants: None significant Delayed phase: No abnormal enhancement Review of the MIP images confirms the above findings IMPRESSION: No significant atherosclerotic disease in the neck. Mild plaque at both carotid bifurcations but no stenosis. Atherosclerotic disease in both carotid siphon regions. 50-70% supraclinoid ICA stenosis on right. Right ICA supplies along the right middle cerebral artery territory. There is some atherosclerotic irregularity within the M1 and M2 branches as well. 30% stenosis at the left vertebral artery origin, not flow limiting. No intracranial posterior circulation disease identified. Electronically Signed   By: Nelson Chimes M.D.   On: 10/23/2015 14:27   Ct Head Wo Contrast  Result Date:  10/25/2015 CLINICAL DATA:  75 y/o  M; stroke follow-up. EXAM: CT HEAD WITHOUT CONTRAST TECHNIQUE: Contiguous axial images were obtained from the base of the skull through the vertex without intravenous contrast. COMPARISON:  10/23/2015 CT head and CT angiogram head and neck. FINDINGS: Brain: No evidence of acute infarction, hemorrhage, hydrocephalus, extra-axial collection or mass lesion/mass effect. Stable mild chronic microvascular ischemic changes and parenchymal volume loss. Vascular: Calcific atherosclerosis of cavernous internal carotid arteries. No hyperdense vessel identified. Skull: Normal. Negative for fracture or focal lesion. Sinuses/Orbits: Partial opacification of left-sided ethmoid air cells and mild mucosal thickening of left sphenoid sinus. Bilateral intra-ocular lens replacement. Other: None. IMPRESSION: No acute intracranial abnormality is identified. Stable mild chronic microvascular ischemic changes and parenchymal volume loss for age. Electronically Signed   By: Kristine Garbe M.D.   On: 10/25/2015 04:04   Ct Angio Neck W Or Wo Contrast  Result Date: 10/23/2015 CLINICAL DATA:  Patient would dementia. Worsening  confusion this morning. Right facial droop and speech disturbance. Negative noncontrast head CT. EXAM: CT ANGIOGRAPHY HEAD AND NECK TECHNIQUE: Multidetector CT imaging of the head and neck was performed using the standard protocol during bolus administration of intravenous contrast. Multiplanar CT image reconstructions and MIPs were obtained to evaluate the vascular anatomy. Carotid stenosis measurements (when applicable) are obtained utilizing NASCET criteria, using the distal internal carotid diameter as the denominator. CONTRAST:  50 cc Isovue 370 COMPARISON:  03/14/2015 FINDINGS: CT HEAD FINDINGS Brain: Background pattern of brain atrophy, could relate prominent in the temporal lobes. No sign of acute infarction. There are mild chronic small-vessel ischemic changes,  most evident in the left basal ganglia/ external capsule region. No large vessel territory infarction. No mass lesion, hemorrhage, hydrocephalus or extra-axial collection. Vascular: There is atherosclerotic calcification of the major vessels at the base of the brain. Skull: Negative Sinuses: Mild mucosal inflammatory changes of the ethmoid sinuses Orbits: Negative Review of the MIP images confirms the above findings CTA NECK FINDINGS Aortic arch: Aortic atherosclerosis without dissection or pronounced irregularity. Branching pattern of the brachiocephalic vessels from the arch is normal. Right carotid system: Common carotid artery widely patent to the bifurcation. Mild atherosclerotic calcification of the carotid bifurcation but no stenosis of the ICA when compared to the more distal cervical ICA. Cervical ICA is normal. Left carotid system: Common carotid artery widely patent to the bifurcation. Atherosclerotic calcification at the carotid bifurcation but no stenosis. Cervical internal carotid artery widely patent. Vertebral arteries: Both vertebral artery origins are patent. There is 30% stenosis on the left secondary to calcified plaque. Beyond that, both vertebral arteries are widely patent through the cervical region Skeleton: Ordinary mild spondylosis Other neck: No soft tissue lesion Upper chest: Mild emphysema.  No focal or active process. Review of the MIP images confirms the above findings CTA HEAD FINDINGS Anterior circulation: Both internal carotid arteries widely patent through the skullbase and siphon regions. Mild calcification in the carotid siphons but no flow-limiting stenosis. The left ICA supplies the left middle cerebral artery territory and both anterior cerebral arteries. No stenosis in that region. The right ICA shows a stenosis of the supraclinoid portion estimated at 50-70%. This vessel supplies only the middle cerebral artery territory. There is some atherosclerotic irregularity of the M1  segment but no stenosis in that region. Posterior circulation: Both vertebral arteries are patent to the basilar. No basilar stenosis. Posterior circulation branch vessels are patent. Venous sinuses: Patent and normal Anatomic variants: None significant Delayed phase: No abnormal enhancement Review of the MIP images confirms the above findings IMPRESSION: No significant atherosclerotic disease in the neck. Mild plaque at both carotid bifurcations but no stenosis. Atherosclerotic disease in both carotid siphon regions. 50-70% supraclinoid ICA stenosis on right. Right ICA supplies along the right middle cerebral artery territory. There is some atherosclerotic irregularity within the M1 and M2 branches as well. 30% stenosis at the left vertebral artery origin, not flow limiting. No intracranial posterior circulation disease identified. Electronically Signed   By: Nelson Chimes M.D.   On: 10/23/2015 14:27   Disposition   Pt is being discharged home today in good condition.  Follow-up Plans & Appointments    Follow-up Information    Larae Grooms, MD .   Specialties:  Cardiology, Radiology, Interventional Cardiology Why:  The office will call you with a follow up appt with in 2-3 days. Please given [place] a call if you have not received a call within that time frame.  Contact  information: [address] [place] [address] [phone]          Discharge Instructions    Amb Referral to Cardiac Rehabilitation    Complete by:  As directed    Diagnosis:   Coronary Stents Comment - to [place] PTCA     Diet - low sodium heart healthy    Complete by:  As directed    Increase activity slowly    Complete by:  As directed       Discharge Medications   Current Discharge Medication List    CONTINUE these medications which have NOT CHANGED   Details  aspirin EC 81 MG tablet Take 81 mg by mouth daily.    clopidogrel (PLAVIX) 75 MG tablet Take 1 tablet (75 mg total) by  mouth daily. Qty: 30 tablet, Refills: 12    escitalopram (LEXAPRO) 10 MG tablet Take 10 mg by mouth daily.    isosorbide mononitrate (IMDUR) 30 MG 24 hr tablet Take 1 tablet (30 mg total) by mouth daily. Qty: 30 tablet, Refills: 11    levothyroxine (SYNTHROID, LEVOTHROID) 25 MCG tablet Take 25 mcg by mouth daily before breakfast.    metoprolol succinate (TOPROL XL) 50 MG 24 hr tablet Take 1 tablet (50 mg total) by mouth daily. Take with or immediately following a meal. Qty: 90 tablet, Refills: 3   Associated Diagnoses: Cardiac pacemaker in situ    Multiple Vitamin (MULTIVITAMIN WITH MINERALS) TABS tablet Take 1 tablet by mouth daily as needed (takes occasionally).     nitroGLYCERIN (NITROSTAT) 0.4 MG SL tablet Place 0.4 mg under the tongue daily as needed for chest pain. Chest pain    Omega-3 1000 MG CAPS Take 1 g by mouth daily as needed (takes occasionally).     ramipril (ALTACE) 2.5 MG capsule Take 2.5 mg by mouth at bedtime.       STOP taking these medications     torsemide (DEMADEX) 20 MG tablet          Aspirin prescribed at discharge?  Yes High Intensity Statin Prescribed? (Lipitor 40-80mg  or Crestor 20-40mg ): No: Intolerant Beta Blocker Prescribed? No: Bradycardia For EF <40%, was ACEI/ARB Prescribed? No: EF ok ADP Receptor Inhibitor Prescribed? (i.e. Plavix etc.-Includes Medically Managed Patients): Yes For EF <40%, Aldosterone Inhibitor Prescribed? No: EF ok Was EF assessed during THIS hospitalization? Yes Was Cardiac Rehab II ordered? (Included Medically managed Patients): Yes   Outstanding Labs/Studies   None  Duration of Discharge Encounter   Greater than 30 minutes including physician time.  Signed, Reino Bellis NP-C 10/25/2015, 3:11 PM   Trude Mcburney

## 2015-10-25 NOTE — Progress Notes (Signed)
Family and pt was told MRI was going to done during night.  No order for MRI. Family was frustrated over this. CTH was order.  CTH completed. Notified on-call neurologist (Dr. Shon Hale) for clarification x2 with no new order. Notified dtr Butch Penny). Wife by bedside is informed.Marland Kitchen

## 2015-10-28 LAB — CUP PACEART REMOTE DEVICE CHECK
Battery Remaining Longevity: 122 mo
Battery Voltage: 3.02 V
Brady Statistic AS VP Percent: 1 %
Brady Statistic AS VS Percent: 38 %
Implantable Lead Implant Date: 20170221
Implantable Lead Location: 753859
Implantable Lead Location: 753860
Lead Channel Impedance Value: 480 Ohm
Lead Channel Pacing Threshold Amplitude: 0.5 V
Lead Channel Pacing Threshold Pulse Width: 0.5 ms
Lead Channel Sensing Intrinsic Amplitude: 11.2 mV
Lead Channel Setting Pacing Amplitude: 1.25 V
Lead Channel Setting Pacing Amplitude: 1.5 V
MDC IDC LEAD IMPLANT DT: 20170221
MDC IDC MSMT BATTERY REMAINING PERCENTAGE: 95.5 %
MDC IDC MSMT LEADCHNL RA PACING THRESHOLD PULSEWIDTH: 0.5 ms
MDC IDC MSMT LEADCHNL RA SENSING INTR AMPL: 3.4 mV
MDC IDC MSMT LEADCHNL RV IMPEDANCE VALUE: 390 Ohm
MDC IDC MSMT LEADCHNL RV PACING THRESHOLD AMPLITUDE: 1 V
MDC IDC SESS DTM: 20170920060017
MDC IDC SET LEADCHNL RV PACING PULSEWIDTH: 0.5 ms
MDC IDC SET LEADCHNL RV SENSING SENSITIVITY: 0.7 mV
MDC IDC STAT BRADY AP VP PERCENT: 1.1 %
MDC IDC STAT BRADY AP VS PERCENT: 61 %
MDC IDC STAT BRADY RA PERCENT PACED: 61 %
MDC IDC STAT BRADY RV PERCENT PACED: 1.3 %
Pulse Gen Model: 2240
Pulse Gen Serial Number: 7877392

## 2015-10-29 ENCOUNTER — Other Ambulatory Visit: Payer: Self-pay

## 2015-10-29 ENCOUNTER — Telehealth: Payer: Self-pay | Admitting: Cardiology

## 2015-10-29 NOTE — Telephone Encounter (Signed)
New message      Pt had a cath last Wednesday.  He has a temp of 101.7 now.  Could this be related to the cath or something else?

## 2015-10-29 NOTE — Telephone Encounter (Signed)
[name] is returning your call . Please call .Marland Kitchen Thanks

## 2015-10-29 NOTE — Telephone Encounter (Signed)
Triage nurse is going to call patient

## 2015-10-29 NOTE — Telephone Encounter (Signed)
Left message of home and cell phone number for patient to call back.

## 2015-10-29 NOTE — Patient Outreach (Signed)
[facility]) Care Management  10/29/2015  [name] September 21, 1940 [id number]       EMMI-Stroke RED ON EMMI ALERT Day # 1 Date: 10/28/15 Red Alert Reason: "Scheduled a follow up appt? No"    Outreach attempt #1 to patient. Spoke with patient and spouse. Spouse managing patient's care and was the one that answered automated EMMI call. Addressed red alert. She reports that she answered in error. Patient has f/u appt with cardiologist on 11/07/15. He was advised no neuro f/u was needed and confirmed via MD note. She did not make PCP appt as she states PCP was aware of hospital admit but did not think she needed to f.u. Advised spouse that is recommended that patient follow with PCP following any hospital stay within 7-14 days. She voiced understanding and will call and make appt. Spouse taking patient to appts. She is managing his meds. She reports that patient had headache earlier to today. It was relieved with two Tylenol tablets. Spouse monitoring patient's BP in the home at least 2x/day-no abnormal readings reported. Reviewed with spouse s/s of stroke and post stroke care management.  She denies any issues/concerns with affording meds and/or managing them. No further RN CM needs or concerns at this time. Advised spouse that automated calls would continue for duration of EMMI Stroke program. Spouse was appreciative of call and voice how reassuring it was to have the calls.     Plan: RN CM will send patient/spouse educational info regarding stroke care. RN CM will notify [facility] of case closure.   Enzo Montgomery, RN,BSN,CCM [place] [facility] Telephonic Care Management Coordinator Direct Phone: [phone] Toll Free: [phone] Fax: [phone]

## 2015-10-29 NOTE — Telephone Encounter (Signed)
Left message for patient to call back  

## 2015-10-29 NOTE — Telephone Encounter (Signed)
Patient had a fever of 101.7. Patient took Tylenol and fever went down to 100.9. Wife stated that Patient's cath site is not red or hot, just bruised. Consulted Dr. Meda Coffee (DOD), she does not think this is related to his heart cath., and he needs to go to urgent care to be evaluated. Called patient's wife back and informed her of Dr. Francesca Oman message. Patient's wife verbalized understanding, and will take patient to urgent care.

## 2015-10-29 NOTE — Telephone Encounter (Signed)
Follow up   Pt wife verbalized that she is returning call for rn

## 2015-10-30 ENCOUNTER — Encounter: Payer: Self-pay | Admitting: Physician Assistant

## 2015-11-05 ENCOUNTER — Encounter: Payer: Self-pay | Admitting: Physician Assistant

## 2015-11-05 DIAGNOSIS — N183 Chronic kidney disease, stage 3 unspecified: Secondary | ICD-10-CM | POA: Insufficient documentation

## 2015-11-05 NOTE — Progress Notes (Signed)
Cardiology Office Note    Date:  11/07/2015  ID:  [name], DOB 10/05/1940, MRN [id number] PCP:  Maris Berger, MD  Cardiologist:  Dr. Ronni Rumble   Chief Complaint: f/u cath, stenting  History of Present Illness:  [name] is a 75 y.o. male with history of CAD (remote stenting >20 years ago, PTCA unknown vessel ~2013, s/p rotational atherectomy/DES to distal RCA and RPDA c/b embolic CVA), OSA, syncope with bradycardia/sinus arrest 02/2015 s/p StJ PPM (lead revision 03/2015), obesity, chronic diastolic CHF, [name] stage III, HTN, statin intolerance, mildly dilated aortic root who presents for follow-up. He was recently evaluated for SOB with 2D echo 09/26/15: mild LVH, EF 55-60%, grade 1 DD, mildly dilated aortic root, calcified MV annulus. Due to suspected angina, he underwent LHC 10/17/15 showing 80% mLAD extending into ostial 2nd diag 90% stenosed, 90% dRCA, 90% RPDA (suspected ISR), 25% ostial-mid Cx, EF 55-60%.  LV was normal. He was started on Imdur and Plavix with a plan to bring back for intervention if unable to improve his anginal symptoms. His anginal symptoms did return shortly after having undergone cath. On 10/22/15, he underwent rotational atherectomy to distal RCA and RPDA lesions, felt to be culprits for his angina. The following day he developed confusion, garbled speech, and left facial droop. CT head showed no acute abnormalities; MRI unable to be performed due to pacemaker. CTA head neck showed atherosclerosis including bilateral siphon and left VA origin. Neurology saw and recommended continuation of DAPT with ASA/Plavix. It was felt his CVA was embolic post-cath. Neurology did not feel he needed neuro f/u. His pacemaker was interrogated and showed no AF. Labs notable for LDL of 88, A1C of 6.1, Hgb 12.3, Plt 143, Cr 1.31 (baseline appears 1.2-1.4 recently).  He presents back to clinic with his wife. He caught PNA after his recent discharge and his PCP stopped  torsemide and rampril due to SBP in the 80s. His BP has subsequently improved. He has not had further CP or SOB. He does report falling asleep easily. He has not been able to tolerate CPAP. No syncope.   Past Medical History:  Diagnosis Date  . Anxiety   . Arthritis    "knees, back, shoulders" (10/22/2015)  . CAD (coronary artery disease)    a. remote stenting >20 years ago. b. PTCA unknown vessel ~2013. c. 10/2015: s/p rotational atherectomy/DES to distal RCA and RPDA c/b embolic CVA  . CHB (complete heart block) ([place]) 02/2015   [name] 03/03/2015  . Chronic diastolic CHF (congestive heart failure) ([place])   . Chronic shoulder pain   . [name] (chronic kidney disease), stage III   . Dilated aortic root ([place])    a. mild by echo 09/2015.  Marland Kitchen Heart attack 'many years ago'  . High cholesterol   . Hypertension   . OSA (obstructive sleep apnea)    "suppose to have a mask; they haven't got one adjusted for him yet" (10/22/2015)  . Presence of permanent cardiac pacemaker   . Sinus arrest   . Skin cancer of face    "had it cut off"  . Statin intolerance   . Symptomatic bradycardia    a. syncope/sinus arrest and bradycardia s/p St. Jude PPM 02/2015 with lead revision 03/2015.    Past Surgical History:  Procedure Laterality Date  . CARDIAC CATHETERIZATION N/A 10/17/2015   Procedure: Left Heart Cath and Coronary Angiography;  Surgeon: Jettie Booze, MD;  Location: [place] [facility];  Service: Cardiovascular;  Laterality:  N/A;  . CARDIAC CATHETERIZATION N/A 10/22/2015   Procedure: Coronary Stent Intervention Rotablater;  Surgeon: Jettie Booze, MD;  Location: [place] [facility];  Service: Cardiovascular;  Laterality: N/A;  . CARDIAC CATHETERIZATION N/A 10/22/2015   Procedure: Left Heart Cath and Coronary Angiography;  Surgeon: Jettie Booze, MD;  Location: [place] [facility];  Service: Cardiovascular;  Laterality: N/A;  . CATARACT EXTRACTION W/ INTRAOCULAR LENS  IMPLANT, BILATERAL  Bilateral 2016  . CORONARY ANGIOPLASTY  2012  . CORONARY ANGIOPLASTY WITH STENT PLACEMENT  2001  . CORONARY ANGIOPLASTY WITH STENT PLACEMENT  10/22/2015   "took 2 out and put 1 longer one in"  . EP IMPLANTABLE DEVICE N/A 03/04/2015   Procedure: Pacemaker Implant;  Surgeon: Will Meredith Leeds, MD;  Location: [place] [facility];  Service: Cardiovascular;  Laterality: N/A;  . EP IMPLANTABLE DEVICE N/A 04/10/2015   Procedure: PPM Lead Revision/Repair;  Surgeon: Will Meredith Leeds, MD;  Location: [place] [facility];  Service: Cardiovascular;  Laterality: N/A;  . EYE SURGERY    . INSERT / REPLACE / REMOVE PACEMAKER    . RETINAL DETACHMENT SURGERY Bilateral    "several on each side"    Current Medications: Current Outpatient Prescriptions  Medication Sig Dispense Refill  . aspirin EC 81 MG tablet Take 81 mg by mouth daily.    . clopidogrel (PLAVIX) 75 MG tablet Take 1 tablet (75 mg total) by mouth daily. 30 tablet 12  . escitalopram (LEXAPRO) 10 MG tablet Take 10 mg by mouth daily.    . isosorbide mononitrate (IMDUR) 30 MG 24 hr tablet Take 1 tablet (30 mg total) by mouth daily. 30 tablet 11  . levothyroxine (SYNTHROID, LEVOTHROID) 25 MCG tablet Take 25 mcg by mouth daily before breakfast.    . metoprolol succinate (TOPROL XL) 50 MG 24 hr tablet Take 1 tablet (50 mg total) by mouth daily. Take with or immediately following a meal. 90 tablet 3  . Multiple Vitamin (MULTIVITAMIN WITH MINERALS) TABS tablet Take 1 tablet by mouth daily as needed (takes occasionally).     . nitroGLYCERIN (NITROSTAT) 0.4 MG SL tablet Place 0.4 mg under the tongue daily as needed for chest pain. Chest pain    . Omega-3 1000 MG CAPS Take 1 g by mouth daily as needed (takes occasionally).     . ramipril (ALTACE) 2.5 MG capsule Take 2.5 mg by mouth at bedtime.      No current facility-administered medications for this visit.      Allergies:   Novocain [procaine]; Rosuvastatin calcium; and Statins   Social History    Social History  . Marital status: Married    Spouse name: N/A  . Number of children: N/A  . Years of education: N/A   Social History Main Topics  . Smoking status: Former Smoker    Packs/day: 1.00    Years: 10.00    Types: Cigarettes  . Smokeless tobacco: Never Used     Comment: "quit smoking in my early 20's"  . Alcohol use Yes     Comment: 10/22/2015 "might have 1 glass of wine/month, if that  . Drug use: No  . Sexual activity: Yes   Other Topics Concern  . None   Social History Narrative  . None     Family History:  The patient's family history includes Diabetes in his brother and sister.   ROS:   Please see the history of present illness.  All other systems are reviewed and otherwise negative.  PHYSICAL EXAM:   VS:  BP 118/74   Pulse 60   Ht 6' (1.829 m)   Wt 225 lb (102.1 kg)   BMI 30.52 kg/m   BMI: Body mass index is 30.52 kg/m. GEN: Well nourished, well developed WM, in no acute distress  HEENT: normocephalic, atraumatic Neck: no JVD, carotid bruits, or masses Cardiac: RRR; no murmurs, rubs, or gallops, no edema  Respiratory:  clear to auscultation bilaterally, normal work of breathing GI: soft, nontender, nondistended, + BS MS: no deformity or atrophy  Skin: warm and dry, no rash, right groin cath site without hematoma, ecchymosis, or bruit. Neuro:  Alert and Oriented x 3, Strength and sensation are intact, follows commands Psych: euthymic mood, full affect  Wt Readings from Last 3 Encounters:  11/07/15 225 lb (102.1 kg)  10/23/15 223 lb 1.7 oz (101.2 kg)  10/17/15 224 lb (101.6 kg)      Studies/Labs Reviewed:   EKG:  EKG was ordered today and personally reviewed by me and demonstrates electronic atrial pacemaker 60bpm   Recent Labs: 03/03/2015: B Natriuretic Peptide 17.6 03/14/2015: ALT 38 10/23/2015: BUN 12; Creatinine, Ser 1.31; Hemoglobin 12.3; Platelets 143; Potassium 3.7; Sodium 139; TSH 1.948   Lipid Panel    Component Value  Date/Time   CHOL 140 10/24/2015 0437   TRIG 106 10/24/2015 0437   HDL 31 (L) 10/24/2015 0437   CHOLHDL 4.5 10/24/2015 0437   VLDL 21 10/24/2015 0437   LDLCALC 88 10/24/2015 0437    Additional studies/ records that were reviewed today include: Summarized above.    ASSESSMENT & PLAN:   1. CAD s/p PCI - doing well. Continue ASA, Plavix, BB, Imdur. He is intolerant of statin. He is interested in cardiac rehab program. I asked him to let us know if he does not hear from them by early next week. 2. Symptomatic bradycardia s/p PPM - followed by EP. 3. Hypertension - recent hypotension noted, stable today. Volume status appears stable. Continue observation for signs of fluid retention. His wife is a Marine scientist and says she feels comfortable keeping an eye on this. 4. Stroke - no significant neuro deficits residually. Continue ASA/Plavix. 5. Hyperlipidemia - he declines f/u in the lipid clinic to consider PCSK9 for now. 6. [name] stage III - further monitoring per primary care. 7. OSA - suspect this is contributing to his persistent daytime sleepiness. I asked him to review additional options for nocturnal oxygen supplementation with his PCP.  Disposition: F/u with Dr. Irish Lack in 3 months. The patient's wife said they decided they wanted to f/u with [facility] for their heart issues but it sounds like today the patient himself wants to re-consider going back to Dr. Bettina Gavia. Will tentatively schedule f/u here and will let them sort this out.   Medication Adjustments/Labs and Tests Ordered: Current medicines are reviewed at length with the patient today.  Concerns regarding medicines are outlined above. Medication changes, Labs and Tests ordered today are summarized above and listed in the Patient Instructions accessible in Encounters.   Raechel Ache PA-C  11/07/2015 10:36 AM    [address], [place], [place]  [address] Phone: [phone]; Fax: [phone]

## 2015-11-07 ENCOUNTER — Encounter: Payer: Self-pay | Admitting: Physician Assistant

## 2015-11-07 ENCOUNTER — Ambulatory Visit (INDEPENDENT_AMBULATORY_CARE_PROVIDER_SITE_OTHER): Payer: Medicare Other | Admitting: Physician Assistant

## 2015-11-07 VITALS — BP 118/74 | HR 60 | Ht 72.0 in | Wt 225.0 lb

## 2015-11-07 DIAGNOSIS — G4733 Obstructive sleep apnea (adult) (pediatric): Secondary | ICD-10-CM

## 2015-11-07 DIAGNOSIS — R001 Bradycardia, unspecified: Secondary | ICD-10-CM | POA: Diagnosis not present

## 2015-11-07 DIAGNOSIS — I6330 Cerebral infarction due to thrombosis of unspecified cerebral artery: Secondary | ICD-10-CM

## 2015-11-07 DIAGNOSIS — Z950 Presence of cardiac pacemaker: Secondary | ICD-10-CM | POA: Diagnosis not present

## 2015-11-07 DIAGNOSIS — N183 Chronic kidney disease, stage 3 unspecified: Secondary | ICD-10-CM

## 2015-11-07 DIAGNOSIS — E785 Hyperlipidemia, unspecified: Secondary | ICD-10-CM

## 2015-11-07 DIAGNOSIS — Z955 Presence of coronary angioplasty implant and graft: Secondary | ICD-10-CM

## 2015-11-07 DIAGNOSIS — I10 Essential (primary) hypertension: Secondary | ICD-10-CM | POA: Diagnosis not present

## 2015-11-07 NOTE — Patient Instructions (Addendum)
Medication Instructions:   CONTINUE TO HOLD TORSEMIDE AND RAMIPRIL UNTIL INSTRUCTED NOT TO  If you need a refill on your cardiac medications before your next appointment, please call your pharmacy.  Labwork: NONE ORDER TODAY    Testing/Procedures: NONE ORDER TODAY    Follow-Up: IN 3 MONTHS WITH DR  Irish Lack   Any Other Special Instructions Will Be Listed Below (If Applicable).   CALL IF ANY SIGNS OF FLUID RETENTION OR BLOOD PRESSURE RUNNING 140 OR HIGHER

## 2015-11-10 NOTE — Addendum Note (Signed)
Addended by: Claude Manges on: 11/10/2015 01:34 PM   Modules accepted: Orders

## 2015-11-14 DIAGNOSIS — R54 Age-related physical debility: Secondary | ICD-10-CM | POA: Insufficient documentation

## 2015-11-20 ENCOUNTER — Telehealth: Payer: Self-pay | Admitting: Interventional Cardiology

## 2015-11-20 NOTE — Telephone Encounter (Signed)
Janett Billow RN [facility] Case Manager) is calling to get Ejection Fraction from [name] . Please call   Thanks

## 2015-11-21 NOTE — Telephone Encounter (Signed)
Left voicemail with Manager [place] asking her to fax over EF % form. Also made her aware on VM for future reference this for must be faxed over anytime she is requesting records from [facility].

## 2015-12-28 NOTE — Progress Notes (Signed)
Cardiology Office Note   Date:  12/29/2015   ID:  [name], DOB 1940/04/16, MRN [id number]  PCP:  Maris Berger, MD    No chief complaint on file. f/u CAD   Wt Readings from Last 3 Encounters:  12/29/15 224 lb (101.6 kg)  11/07/15 225 lb (102.1 kg)  10/23/15 223 lb 1.7 oz (101.2 kg)       History of Present Illness: [name] is a 75 y.o. male  with history of CAD (remote stenting >20 years ago, PTCA unknown vessel ~2013, s/p rotational atherectomy/DES to distal RCA and RPDA c/b embolic CVA), OSA, syncope with bradycardia/sinus arrest 02/2015 s/p StJ PPM (lead revision 03/2015), obesity, chronic diastolic CHF, [name] stage III, HTN, statin intolerance, mildly dilated aortic root who presents for follow-up. He was recently evaluated for SOB with 2D echo 09/26/15: mild LVH, EF 55-60%, grade 1 DD, mildly dilated aortic root, calcified MV annulus. Due to suspected angina, he underwent LHC 10/17/15 showing 80% mLAD extending into ostial 2nd diag 90% stenosed, 90% dRCA, 90% RPDA (suspected ISR), 25% ostial-mid Cx, EF 55-60%.  LV was normal. He was started on Imdur and Plavix with a plan to bring back for intervention if unable to improve his anginal symptoms. His anginal symptoms did return shortly after having undergone cath. On 10/22/15, he underwent rotational atherectomy to distal RCA and RPDA lesions, felt to be culprits for his angina. The following day he developed confusion, garbled speech, and left facial droop. CT head showed no acute abnormalities; MRI unable to be performed due to pacemaker. CTA head neck showed atherosclerosis including bilateral siphon and left VA origin. Neurology saw and recommended continuation of DAPT with ASA/Plavix. It was felt his CVA was embolic post-cath. Neurology did not feel he needed neuro f/u. His pacemaker was interrogated and showed no AF. Labs notable for LDL of 88, A1C of 6.1, Hgb 12.3, Plt 143, Cr 1.31 (baseline appears 1.2-1.4  recently).  He recovered neurologically by the time of his last visit.    Wife mentions that his memory is decreased.  He has DOE.  He did not do cardiac rehab.  He is in the process of testing for home Oxygen at night.  He sleeps a lot.  He did not tolerate CPAP in the past.    He has some soreness at his pacer site- chronic since the inserton.  No recent NTG use.  He thinks he walks more than 30 minutes per day without any chest pain.  He still preaches.    EF was normal.    Past Medical History:  Diagnosis Date  . Anxiety   . Arthritis    "knees, back, shoulders" (10/22/2015)  . CAD (coronary artery disease)    a. remote stenting >20 years ago. b. PTCA unknown vessel ~2013. c. 10/2015: s/p rotational atherectomy/DES to distal RCA and RPDA c/b embolic CVA  . CHB (complete heart block) ([place]) 02/2015   [name] 03/03/2015  . Chronic diastolic CHF (congestive heart failure) ([place])   . Chronic shoulder pain   . [name] (chronic kidney disease), stage III   . Dilated aortic root ([place])    a. mild by echo 09/2015.  Marland Kitchen Heart attack 'many years ago'  . High cholesterol   . Hypertension   . OSA (obstructive sleep apnea)    "suppose to have a mask; they haven't got one adjusted for him yet" (10/22/2015)  . Presence of permanent cardiac pacemaker   . Sinus arrest   .  Skin cancer of face    "had it cut off"  . Statin intolerance   . Symptomatic bradycardia    a. syncope/sinus arrest and bradycardia s/p St. Jude PPM 02/2015 with lead revision 03/2015.    Past Surgical History:  Procedure Laterality Date  . CARDIAC CATHETERIZATION N/A 10/17/2015   Procedure: Left Heart Cath and Coronary Angiography;  Surgeon: Jettie Booze, MD;  Location: [place] [facility];  Service: Cardiovascular;  Laterality: N/A;  . CARDIAC CATHETERIZATION N/A 10/22/2015   Procedure: Coronary Stent Intervention Rotablater;  Surgeon: Jettie Booze, MD;  Location: [place] [facility];  Service: Cardiovascular;   Laterality: N/A;  . CARDIAC CATHETERIZATION N/A 10/22/2015   Procedure: Left Heart Cath and Coronary Angiography;  Surgeon: Jettie Booze, MD;  Location: [place] [facility];  Service: Cardiovascular;  Laterality: N/A;  . CATARACT EXTRACTION W/ INTRAOCULAR LENS  IMPLANT, BILATERAL Bilateral 2016  . CORONARY ANGIOPLASTY  2012  . CORONARY ANGIOPLASTY WITH STENT PLACEMENT  2001  . CORONARY ANGIOPLASTY WITH STENT PLACEMENT  10/22/2015   "took 2 out and put 1 longer one in"  . EP IMPLANTABLE DEVICE N/A 03/04/2015   Procedure: Pacemaker Implant;  Surgeon: Will Meredith Leeds, MD;  Location: [place] [facility];  Service: Cardiovascular;  Laterality: N/A;  . EP IMPLANTABLE DEVICE N/A 04/10/2015   Procedure: PPM Lead Revision/Repair;  Surgeon: Will Meredith Leeds, MD;  Location: [place] [facility];  Service: Cardiovascular;  Laterality: N/A;  . EYE SURGERY    . INSERT / REPLACE / REMOVE PACEMAKER    . RETINAL DETACHMENT SURGERY Bilateral    "several on each side"     Current Outpatient Prescriptions  Medication Sig Dispense Refill  . aspirin EC 81 MG tablet Take 81 mg by mouth daily.    . clopidogrel (PLAVIX) 75 MG tablet Take 1 tablet (75 mg total) by mouth daily. 30 tablet 12  . escitalopram (LEXAPRO) 10 MG tablet Take 10 mg by mouth daily.    . isosorbide mononitrate (IMDUR) 30 MG 24 hr tablet Take 1 tablet (30 mg total) by mouth daily. 30 tablet 11  . levothyroxine (SYNTHROID, LEVOTHROID) 25 MCG tablet Take 25 mcg by mouth daily before breakfast.    . metoprolol succinate (TOPROL XL) 50 MG 24 hr tablet Take 1 tablet (50 mg total) by mouth daily. Take with or immediately following a meal. 90 tablet 3  . Multiple Vitamin (MULTIVITAMIN WITH MINERALS) TABS tablet Take 1 tablet by mouth daily as needed (takes occasionally).     . nitroGLYCERIN (NITROSTAT) 0.4 MG SL tablet Place 0.4 mg under the tongue daily as needed for chest pain. Chest pain    . Omega-3 1000 MG CAPS Take 1 g by mouth daily  as needed (takes occasionally).     . ramipril (ALTACE) 2.5 MG capsule Take 2.5 mg by mouth at bedtime.      No current facility-administered medications for this visit.     Allergies:   Novocain [procaine]; Rosuvastatin calcium; and Statins    Social History:  The patient  reports that he has quit smoking. His smoking use included Cigarettes. He has a 10.00 pack-year smoking history. He has never used smokeless tobacco. He reports that he drinks alcohol. He reports that he does not use drugs.   Family History:  The patient's family history includes Diabetes in his brother and sister.    ROS:  Please see the history of present illness.   Otherwise, review of systems are  positive for DOE.   All other systems are reviewed and negative.    PHYSICAL EXAM: VS:  BP 118/64   Pulse 60   Ht 6' (1.829 m)   Wt 224 lb (101.6 kg)   BMI 30.38 kg/m  , BMI Body mass index is 30.38 kg/m. GEN: Well nourished, well developed, in no acute distress  HEENT: normal  Neck: no JVD, carotid bruits, or masses Cardiac: RRR; no murmurs, rubs, or gallops,no edema  Respiratory:  clear to auscultation bilaterally, normal work of breathing GI: soft, nontender, nondistended, + BS MS: no deformity or atrophy  Skin: warm and dry, no rash Neuro:  Strength and sensation are intact Psych: euthymic mood, full affect    Recent Labs: 03/03/2015: B Natriuretic Peptide 17.6 03/14/2015: ALT 38 10/23/2015: BUN 12; Creatinine, Ser 1.31; Hemoglobin 12.3; Platelets 143; Potassium 3.7; Sodium 139; TSH 1.948   Lipid Panel    Component Value Date/Time   CHOL 140 10/24/2015 0437   TRIG 106 10/24/2015 0437   HDL 31 (L) 10/24/2015 0437   CHOLHDL 4.5 10/24/2015 0437   VLDL 21 10/24/2015 0437   LDLCALC 88 10/24/2015 0437     Other studies Reviewed: Additional studies/ records that were reviewed today with results demonstrating: cath and hospital records reviewed..   ASSESSMENT AND PLAN:  1. CAD s/p PCI - doing well.  Continue ASA, Plavix, BB, Imdur. He is intolerant of statin. He is interested in cardiac rehab program.  Residual LAD disease managed medically. Any future stress test will be abnormal beause of this.  CVA post RCA PCI.  WOuld not repeat cath unless there was clear angina. 2. Symptomatic bradycardia s/p PPM - followed by EP. 3. Hypertension -  stable today. Volume status appears stable. Continue observation for signs of fluid retention.  4. Stroke - no significant neuro deficits residually. Continue ASA/Plavix. 5. Hyperlipidemia - he declined f/u in the lipid clinic to consider PCSK9. 6. [name] stage III - further monitoring per primary care. 7. OSA - suspect this is contributing to his persistent daytime sleepiness. Looking at options for nocturnal oxygen supplementation with his PCP.   Current medicines are reviewed at length with the patient today.  The patient concerns regarding his medicines were addressed.  The following changes have been made:  No change  Labs/ tests ordered today include:  No orders of the defined types were placed in this encounter.   Recommend 150 minutes/week of aerobic exercise Low fat, low carb, high fiber diet recommended  Disposition:   FU in 6 months   Signed, Larae Grooms, MD  12/29/2015 2:17 PM    [facility] [address], [place], [place]  [address] Phone: [phone]; Fax: [phone]

## 2015-12-29 ENCOUNTER — Encounter: Payer: Self-pay | Admitting: Interventional Cardiology

## 2015-12-29 ENCOUNTER — Encounter: Payer: Self-pay | Admitting: *Deleted

## 2015-12-29 ENCOUNTER — Ambulatory Visit (INDEPENDENT_AMBULATORY_CARE_PROVIDER_SITE_OTHER): Payer: Medicare Other | Admitting: Interventional Cardiology

## 2015-12-29 VITALS — BP 118/64 | HR 60 | Ht 72.0 in | Wt 224.0 lb

## 2015-12-29 DIAGNOSIS — Z955 Presence of coronary angioplasty implant and graft: Secondary | ICD-10-CM

## 2015-12-29 DIAGNOSIS — E782 Mixed hyperlipidemia: Secondary | ICD-10-CM

## 2015-12-29 DIAGNOSIS — I10 Essential (primary) hypertension: Secondary | ICD-10-CM | POA: Diagnosis not present

## 2015-12-29 DIAGNOSIS — I2510 Atherosclerotic heart disease of native coronary artery without angina pectoris: Secondary | ICD-10-CM

## 2015-12-29 NOTE — Patient Instructions (Addendum)
Medication Instructions:  Same-no changes  Labwork: None  Testing/Procedures: None  Follow-Up: Your physician wants you to follow-up in: 6 months. You will receive a reminder letter in the mail two months in advance. If you don't receive a letter, please call our office to schedule the follow-up appointment.  You have been referred to Cardiac Rehab. Someone from that department will be contacting you.     If you need a refill on your cardiac medications before your next appointment, please call your pharmacy.

## 2015-12-31 ENCOUNTER — Ambulatory Visit (INDEPENDENT_AMBULATORY_CARE_PROVIDER_SITE_OTHER): Payer: Medicare Other | Admitting: *Deleted

## 2015-12-31 DIAGNOSIS — I442 Atrioventricular block, complete: Secondary | ICD-10-CM | POA: Diagnosis not present

## 2015-12-31 NOTE — Progress Notes (Signed)
Remote pacemaker transmission.   

## 2016-01-02 ENCOUNTER — Telehealth (HOSPITAL_COMMUNITY): Payer: Self-pay | Admitting: *Deleted

## 2016-01-02 NOTE — Telephone Encounter (Signed)
Returning a call from pt wife regarding message left on yesterday.  Pt would like to attend the cardiac rehab program in [place] due to living in [place].  Received permission to give pt contact information to the rehab program at [facility].  Called [facility] and left message for them to please contact pt for sign up to cardiac rehab. Cherre Huger, BSN

## 2016-01-09 LAB — CUP PACEART REMOTE DEVICE CHECK
Battery Remaining Percentage: 95.5 %
Battery Voltage: 3.02 V
Brady Statistic AP VP Percent: 1.1 %
Brady Statistic AS VS Percent: 32 %
Brady Statistic RA Percent Paced: 67 %
Implantable Lead Implant Date: 20170221
Implantable Lead Implant Date: 20170221
Implantable Lead Location: 753859
Implantable Pulse Generator Implant Date: 20170221
Lead Channel Impedance Value: 390 Ohm
Lead Channel Pacing Threshold Amplitude: 0.5 V
Lead Channel Pacing Threshold Pulse Width: 0.5 ms
Lead Channel Sensing Intrinsic Amplitude: 5 mV
Lead Channel Setting Sensing Sensitivity: 0.7 mV
MDC IDC LEAD LOCATION: 753860
MDC IDC MSMT BATTERY REMAINING LONGEVITY: 120 mo
MDC IDC MSMT LEADCHNL RA IMPEDANCE VALUE: 480 Ohm
MDC IDC MSMT LEADCHNL RV PACING THRESHOLD AMPLITUDE: 1 V
MDC IDC MSMT LEADCHNL RV PACING THRESHOLD PULSEWIDTH: 0.5 ms
MDC IDC MSMT LEADCHNL RV SENSING INTR AMPL: 11.9 mV
MDC IDC PG SERIAL: 7877392
MDC IDC SESS DTM: 20171220070017
MDC IDC SET LEADCHNL RA PACING AMPLITUDE: 1.5 V
MDC IDC SET LEADCHNL RV PACING AMPLITUDE: 1.25 V
MDC IDC SET LEADCHNL RV PACING PULSEWIDTH: 0.5 ms
MDC IDC STAT BRADY AP VS PERCENT: 67 %
MDC IDC STAT BRADY AS VP PERCENT: 1 %
MDC IDC STAT BRADY RV PERCENT PACED: 1.3 %

## 2016-01-15 DIAGNOSIS — D513 Other dietary vitamin B12 deficiency anemia: Secondary | ICD-10-CM | POA: Insufficient documentation

## 2016-03-13 ENCOUNTER — Emergency Department (HOSPITAL_COMMUNITY): Payer: Medicare Other

## 2016-03-13 ENCOUNTER — Emergency Department (HOSPITAL_COMMUNITY)
Admission: EM | Admit: 2016-03-13 | Discharge: 2016-03-13 | Disposition: A | Discharge status: Home / Self Care | Payer: Medicare Other | Attending: Emergency Medicine | Admitting: Emergency Medicine

## 2016-03-13 ENCOUNTER — Encounter (HOSPITAL_COMMUNITY): Payer: Self-pay | Admitting: *Deleted

## 2016-03-13 DIAGNOSIS — I2510 Atherosclerotic heart disease of native coronary artery without angina pectoris: Secondary | ICD-10-CM | POA: Diagnosis not present

## 2016-03-13 DIAGNOSIS — F329 Major depressive disorder, single episode, unspecified: Secondary | ICD-10-CM | POA: Diagnosis present

## 2016-03-13 DIAGNOSIS — I130 Hypertensive heart and chronic kidney disease with heart failure and stage 1 through stage 4 chronic kidney disease, or unspecified chronic kidney disease: Secondary | ICD-10-CM | POA: Insufficient documentation

## 2016-03-13 DIAGNOSIS — I5032 Chronic diastolic (congestive) heart failure: Secondary | ICD-10-CM | POA: Insufficient documentation

## 2016-03-13 DIAGNOSIS — R531 Weakness: Secondary | ICD-10-CM

## 2016-03-13 DIAGNOSIS — Z79899 Other long term (current) drug therapy: Secondary | ICD-10-CM | POA: Insufficient documentation

## 2016-03-13 DIAGNOSIS — E039 Hypothyroidism, unspecified: Secondary | ICD-10-CM | POA: Diagnosis not present

## 2016-03-13 DIAGNOSIS — N183 Chronic kidney disease, stage 3 (moderate): Secondary | ICD-10-CM | POA: Diagnosis not present

## 2016-03-13 DIAGNOSIS — R410 Disorientation, unspecified: Secondary | ICD-10-CM | POA: Diagnosis not present

## 2016-03-13 DIAGNOSIS — Z87891 Personal history of nicotine dependence: Secondary | ICD-10-CM | POA: Insufficient documentation

## 2016-03-13 DIAGNOSIS — I252 Old myocardial infarction: Secondary | ICD-10-CM | POA: Diagnosis not present

## 2016-03-13 DIAGNOSIS — Z7982 Long term (current) use of aspirin: Secondary | ICD-10-CM | POA: Diagnosis not present

## 2016-03-13 DIAGNOSIS — R0682 Tachypnea, not elsewhere classified: Secondary | ICD-10-CM | POA: Diagnosis not present

## 2016-03-13 LAB — I-STAT CG4 LACTIC ACID, ED: LACTIC ACID, VENOUS: 1.76 mmol/L (ref 0.5–1.9)

## 2016-03-13 LAB — I-STAT TROPONIN, ED: TROPONIN I, POC: 0 ng/mL (ref 0.00–0.08)

## 2016-03-13 LAB — COMPREHENSIVE METABOLIC PANEL
ALBUMIN: 3.6 g/dL (ref 3.5–5.0)
ALK PHOS: 61 U/L (ref 38–126)
ALT: 21 U/L (ref 17–63)
AST: 28 U/L (ref 15–41)
Anion gap: 6 (ref 5–15)
BUN: 12 mg/dL (ref 6–20)
CALCIUM: 9.7 mg/dL (ref 8.9–10.3)
CO2: 28 mmol/L (ref 22–32)
CREATININE: 1.37 mg/dL — AB (ref 0.61–1.24)
Chloride: 103 mmol/L (ref 101–111)
GFR calc Af Amer: 56 mL/min — ABNORMAL LOW (ref >60)
GFR calc non Af Amer: 48 mL/min — ABNORMAL LOW (ref >60)
GLUCOSE: 174 mg/dL — AB (ref 65–99)
Potassium: 3.6 mmol/L (ref 3.5–5.1)
SODIUM: 137 mmol/L (ref 135–145)
Total Bilirubin: 0.7 mg/dL (ref 0.3–1.2)
Total Protein: 7.8 g/dL (ref 6.5–8.1)

## 2016-03-13 LAB — I-STAT VENOUS BLOOD GAS, ED
Acid-Base Excess: 1 mmol/L (ref 0.0–2.0)
Bicarbonate: 28.2 mmol/L — ABNORMAL HIGH (ref 20.0–28.0)
O2 SAT: 61 %
TCO2: 30 mmol/L (ref 0–100)
pCO2, Ven: 57.2 mmHg (ref 44.0–60.0)
pH, Ven: 7.301 (ref 7.250–7.430)
pO2, Ven: 36 mmHg (ref 32.0–45.0)

## 2016-03-13 LAB — CBC
HEMATOCRIT: 35.9 % — AB (ref 39.0–52.0)
Hemoglobin: 12 g/dL — ABNORMAL LOW (ref 13.0–17.0)
MCH: 30.7 pg (ref 26.0–34.0)
MCHC: 33.4 g/dL (ref 30.0–36.0)
MCV: 91.8 fL (ref 78.0–100.0)
Platelets: 198 10*3/uL (ref 150–400)
RBC: 3.91 MIL/uL — ABNORMAL LOW (ref 4.22–5.81)
RDW: 13.5 % (ref 11.5–15.5)
WBC: 5.2 10*3/uL (ref 4.0–10.5)

## 2016-03-13 LAB — URINALYSIS, ROUTINE W REFLEX MICROSCOPIC
BILIRUBIN URINE: NEGATIVE
Glucose, UA: NEGATIVE mg/dL
Hgb urine dipstick: NEGATIVE
KETONES UR: NEGATIVE mg/dL
Leukocytes, UA: NEGATIVE
NITRITE: NEGATIVE
PH: 5 (ref 5.0–8.0)
PROTEIN: NEGATIVE mg/dL
Specific Gravity, Urine: 1.011 (ref 1.005–1.030)

## 2016-03-13 LAB — BRAIN NATRIURETIC PEPTIDE: B Natriuretic Peptide: 29 pg/mL (ref 0.0–100.0)

## 2016-03-13 LAB — CBG MONITORING, ED: Glucose-Capillary: 130 mg/dL — ABNORMAL HIGH (ref 65–99)

## 2016-03-13 LAB — TSH: TSH: 1.482 u[IU]/mL (ref 0.350–4.500)

## 2016-03-13 NOTE — ED Provider Notes (Signed)
[name] Provider Note   CSN: [id number] Arrival date & time: 03/13/16  1527  History   Chief Complaint Chief Complaint  Patient presents with  . Weakness  . Altered Mental Status   HPI [name] is a 76 y.o. male.  HPI Arrives with wife concerned about mental decline this morning Wife states that he has had a slow progression of his mental function over the last month and records show some deterioration as well of mental status recorded He's had significant medical disease of the last year and has had functional decline and patient has been more depressed but has also had to retire one month ago because as patient states it himself, "my mind isn't working". He is keenly aware that he sometimes can't recognize the names of his children but this has been ongoing now Wife states that he sleeps all the time He has on no new medications he is not on any sedative medications  He denies any dysuria cough or chest pain Wife states that he appears to be breathing more rapidly lately She is concerned that he may have had a stroke Patient has not had any chest pain or injured his head He denies any headaches He is a retired Environmental education officer  Past Medical History:  Diagnosis Date  . Anxiety   . Arthritis    "knees, back, shoulders" (10/22/2015)  . CAD (coronary artery disease)    a. remote stenting >20 years ago. b. PTCA unknown vessel ~2013. c. 10/2015: s/p rotational atherectomy/DES to distal RCA and RPDA c/b embolic CVA  . CHB (complete heart block) ([place]) 02/2015   [name] 03/03/2015  . Chronic diastolic CHF (congestive heart failure) ([place])   . Chronic shoulder pain   . [name] (chronic kidney disease), stage III   . Dilated aortic root ([place])    a. mild by echo 09/2015.  Marland Kitchen Heart attack 'many years ago'  . High cholesterol   . Hypertension   . OSA (obstructive sleep apnea)    "suppose to have a mask; they haven't got one adjusted for him yet" (10/22/2015)  . Presence of  permanent cardiac pacemaker   . Sinus arrest   . Skin cancer of face    "had it cut off"  . Statin intolerance   . Symptomatic bradycardia    a. syncope/sinus arrest and bradycardia s/p St[employer] Jude PPM 02/2015 with lead revision 03/2015.    Patient Active Problem List   Diagnosis Date Noted  . Anemia, macrocytic 12/24/2015  . Frail elderly 11/14/2015  . History of heart artery stent 11/14/2015  . [name] stage G3a/A1, GFR 45-59 and albumin creatinine ratio <30 mg/g 11/05/2015  . Facial weakness   . S/P PTCA (percutaneous transluminal coronary angioplasty)   . Hyperlipidemia   . Essential hypertension   . S/P [facility] Feb 2017 10/23/2015  . CAD-S/P remote PCI 20 yrs ago, ? 4 years ago, and s/p RCA PCI/DES 10/22/15 10/23/2015  . Acute on chronic alteration in mental status post PCI 10/23/2015  . Cerebral thrombosis with cerebral infarction 10/23/2015  . DOE (dyspnea on exertion)   . Accelerating angina ([place])   . Hypothyroidism 07/09/2015  . Fatigue due to depression 05/09/2015  . Moderate single current episode of major depressive disorder ([place]) 05/09/2015  . 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 Syncope 04/10/2015  . Faintness   . Retinal detachment, tractional, right eye 03/15/2015  . Hepatic cyst 03/11/2015  . Pacemaker 03/11/2015  . Abnormal electrocardiogram 03/10/2015  .  Contracture of ankle and foot joint 03/10/2015  . Drug therapy 03/10/2015  . Dysphagia 03/10/2015  . Edema 03/10/2015  . History of ST elevation myocardial infarction (STEMI) 03/10/2015  . Low back pain 03/10/2015  . MCI (mild cognitive impairment) 03/10/2015  . Mediastinal lymphadenopathy 03/10/2015  . Obesity 03/10/2015  . Osteoarthritis 03/10/2015  . 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 Personal history of noncompliance with medical treatment, presenting hazards to health 03/10/2015  . 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 Symptomatic bradycardia 03/04/2015  . History of  complete heart block 03/03/2015  . 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 Abnormal chest x-ray 02/19/2015  . Abnormal TSH 02/19/2015  . Anxiety about health 02/19/2015  . CAD in native artery 02/19/2015  . Diastolic CHF ([place]) [id number]  . 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 LVH (left ventricular hypertrophy) 02/19/2015  . Medical non-compliance 02/19/2015  . OSA (obstructive sleep apnea) 02/19/2015  . Prediabetes 02/19/2015  . Rotator cuff tear, right 02/19/2015  . Abnormal computed tomography scan 02/10/2015  . Abnormal CT of the chest 02/10/2015  . Coronary artery disease involving native coronary artery of native heart 01/23/2015  . Non-Q wave myocardial infarction ([place]) 09/09/2011    Past Surgical History:  Procedure Laterality Date  . CARDIAC CATHETERIZATION N/A 10/17/2015   Procedure: Left Heart Cath and Coronary Angiography;  Surgeon: Jettie Booze, MD;  Location: [facility];  Service: Cardiovascular;  Laterality: N/A;  . CARDIAC CATHETERIZATION N/A 10/22/2015   Procedure: Coronary Stent Intervention Rotablater;  Surgeon: Jettie Booze, MD;  Location: [facility];  Service: Cardiovascular;  Laterality: N/A;  . CARDIAC CATHETERIZATION N/A 10/22/2015   Procedure: Left Heart Cath and Coronary Angiography;  Surgeon: Jettie Booze, MD;  Location: [facility];  Service: Cardiovascular;  Laterality: N/A;  . CATARACT EXTRACTION W/ INTRAOCULAR LENS  IMPLANT, BILATERAL Bilateral 2016  . CORONARY ANGIOPLASTY  2012  . CORONARY ANGIOPLASTY WITH STENT PLACEMENT  2001  . CORONARY ANGIOPLASTY WITH STENT PLACEMENT  10/22/2015   "took 2 out and put 1 longer one in"  . EP IMPLANTABLE DEVICE N/A 03/04/2015   Procedure: Pacemaker Implant;  Surgeon: Will Meredith Leeds, MD;  Location: [facility];  Service: Cardiovascular;  Laterality: N/A;  . EP IMPLANTABLE DEVICE N/A 04/10/2015   Procedure: PPM Lead Revision/Repair;   Surgeon: Will Meredith Leeds, MD;  Location: [facility];  Service: Cardiovascular;  Laterality: N/A;  . EYE SURGERY    . INSERT / REPLACE / REMOVE PACEMAKER    . RETINAL DETACHMENT SURGERY Bilateral    "several on each side"       Home Medications    Prior to Admission medications   Medication Sig Start Date End Date Taking? Authorizing Provider  aspirin EC 81 MG tablet Take 81 mg by mouth daily.   Yes Historical Provider, MD  clopidogrel (PLAVIX) 75 MG tablet Take 1 tablet (75 mg total) by mouth daily. 10/20/15  Yes Arbutus Leas, NP  escitalopram (LEXAPRO) 10 MG tablet Take 10 mg by mouth daily.   Yes Historical Provider, MD  isosorbide mononitrate (IMDUR) 30 MG 24 hr tablet Take 1 tablet (30 mg total) by mouth daily. 10/17/15  Yes Jettie Booze, MD  levothyroxine (SYNTHROID, LEVOTHROID) 25 MCG tablet Take 25 mcg by mouth daily before breakfast.   Yes Historical Provider, MD  metoprolol succinate (TOPROL XL) 50 MG 24 hr tablet Take 1 tablet (50 mg total) by mouth daily.  Take with or immediately following a meal. 07/02/15  Yes Will Meredith Leeds, MD  Multiple Vitamin (MULTIVITAMIN WITH MINERALS) TABS tablet Take 1 tablet by mouth daily as needed (takes occasionally).    Yes Historical Provider, MD  nitroGLYCERIN (NITROSTAT) 0.4 MG SL tablet Place 0.4 mg under the tongue daily as needed for chest pain. Chest pain   Yes Historical Provider, MD  Omega-3 1000 MG CAPS Take 1 g by mouth daily.    Yes Historical Provider, MD  ramipril (ALTACE) 2.5 MG capsule Take 2.5 mg by mouth at bedtime.    Yes Historical Provider, MD  torsemide (DEMADEX) 20 MG tablet Take 20 mg by mouth every morning. 03/03/16  Yes Historical Provider, MD    Family History Family History  Problem Relation Age of Onset  . Diabetes Sister   . Diabetes Brother     Social History Social History  Substance Use Topics  . Smoking status: Former Smoker    Packs/day: 1.00    Years: 10.00    Types: Cigarettes    . Smokeless tobacco: Never Used     Comment: "quit smoking in my early 20's"  . Alcohol use Yes     Comment: 10/22/2015 "might have 1 glass of wine/month, if that     Allergies   Novocain [procaine]; Rosuvastatin calcium; and Statins   Review of Systems Review of Systems  Constitutional: Negative for fever.  Respiratory: Negative for cough and shortness of breath.   Cardiovascular: Positive for leg swelling. Negative for chest pain.  Gastrointestinal: Negative for abdominal pain and vomiting.  Allergic/Immunologic: Negative for immunocompromised state.  All other systems reviewed and are negative.    Physical Exam Updated Vital Signs BP 112/82 (BP Location: Left Arm)   Pulse 80   Temp 97.7 F (36.5 C) (Oral)   Resp 18   SpO2 97%   Physical Exam  Constitutional: He appears well-developed and well-nourished. No distress.  HENT:  Head: Normocephalic and atraumatic.  Eyes: Conjunctivae are normal. Pupils are equal, round, and reactive to light. Right eye exhibits no discharge. Left eye exhibits no discharge.  Neck: Normal range of motion. Neck supple.  Cardiovascular: Normal rate and regular rhythm.   No murmur heard. Pulmonary/Chest: Effort normal and breath sounds normal. No respiratory distress.  Abdominal: Soft. Bowel sounds are normal. He exhibits no distension and no mass. There is no tenderness. There is no rebound and no guarding.  Musculoskeletal: He exhibits no edema.  Neurological: He has normal strength. He displays normal reflexes. No cranial nerve deficit (initially appeared to have left sided mild droop but wife states he sometimes just holds mouth this way, resolved) or sensory deficit. He exhibits normal muscle tone. Coordination normal.  Somnolent Oriented to person only Bilaterally very ticklish on LEs so unable to test for Babinski  Skin: Skin is warm. Capillary refill takes less than 2 seconds. He is not diaphoretic.  Psychiatric: He has a normal mood  and affect.  Nursing note and vitals reviewed.    ED Treatments / Results  Labs (all labs ordered are listed, but only abnormal results are displayed) Labs Reviewed  COMPREHENSIVE METABOLIC PANEL - Abnormal; Notable for the following:       Result Value   Glucose, Bld 174 (*)    Creatinine, Ser 1.37 (*)    GFR calc non Af Amer 48 (*)    GFR calc Af Amer 56 (*)    All other components within normal limits  CBC - Abnormal; Notable  for the following:    RBC 3.91 (*)    Hemoglobin 12.0 (*)    HCT 35.9 (*)    All other components within normal limits  CBG MONITORING, ED - Abnormal; Notable for the following:    Glucose-Capillary 130 (*)    All other components within normal limits  I-STAT VENOUS BLOOD GAS, ED - Abnormal; Notable for the following:    Bicarbonate 28.2 (*)    All other components within normal limits  URINALYSIS, ROUTINE W REFLEX MICROSCOPIC  TSH  BRAIN NATRIURETIC PEPTIDE  I-STAT TROPOININ, ED  I-STAT CG4 LACTIC ACID, ED  I-STAT CG4 LACTIC ACID, ED    EKG  EKG Interpretation  Date/Time:  Saturday March 13 2016 15:42:35 EST Ventricular Rate:  85 PR Interval:  236 QRS Duration: 74 QT Interval:  394 QTC Calculation: 468 R Axis:   -29 Text Interpretation:  VENTRICULAR PACED RHYTHM Abnormal ekg Confirmed by Carmin Muskrat  MD [phone]) on 03/13/2016 4:05:44 PM       Radiology Ct Head Wo Contrast  Result Date: 03/13/2016 CLINICAL DATA:  Altered mental status, unsteady gait, confusion and hallucinations. EXAM: CT HEAD WITHOUT CONTRAST TECHNIQUE: Contiguous axial images were obtained from the base of the skull through the vertex without intravenous contrast. COMPARISON:  Head CT dated 10/25/2015. FINDINGS: Brain: There is generalized parenchymal atrophy with commensurate dilatation of the ventricles and sulci. Mild chronic small vessel ischemic changes within the deep periventricular white matter. There is no mass, hemorrhage, edema or other evidence of acute  parenchymal abnormality. No extra-axial hemorrhage. Vascular: No hyperdense vessel or unexpected calcification. Skull: Normal. Negative for fracture or focal lesion. Sinuses/Orbits: Paranasal sinuses are clear. The hyperdense right orbit is unchanged. Other: None. IMPRESSION: No acute findings.  No intracranial mass, hemorrhage or edema. Atrophy and chronic ischemic changes in the periventricular white matter. Electronically Signed   By: Franki Cabot M.D.   On: 03/13/2016 18:06   Dg Chest Port 1 View  Result Date: 03/13/2016 CLINICAL DATA:  Tachypnea.  History of hypertension. EXAM: PORTABLE CHEST 1 VIEW COMPARISON:  04/11/2015 FINDINGS: Patient's left-sided transvenous pacemaker with leads to the right atrium and right ventricle. Heart is mildly enlarged. Stable elevation of right hemidiaphragm. Lungs are clear. No pulmonary edema. IMPRESSION: Stable cardiomegaly. Electronically Signed   By: Nolon Nations M.D.   On: 03/13/2016 17:21    Procedures Procedures (including critical care time)  Medications Ordered in ED Medications - No data to display   Initial Impression / Assessment and Plan / ED Course  I have reviewed the triage vital signs and the nursing notes.  Pertinent labs & imaging results that were available during my care of the patient were reviewed by me and considered in my medical decision making (see chart for details).     Patient has acute on chronic deterioration in his mental function  He states he has not been diagnosed with dementia formally  He does appear to have significant depression which is already treated  Patient is very tired and sleeping "all the time"  Today, wife became more concerned as he was sleeping more but denies any focal deficits or focal complaints  Head CT obtained without acute abnormality  Labs obtained and reassuring including kidney function, electrolytes, glucose  VBG reassuring  Troponin negative No white count; UA negative, CXR wo  PNA Changes appear to be long-lasting, will have pt fu with pcp No acute emergency identified or condition requiring admission  Final Clinical Impressions(s) / ED Diagnoses  Final diagnoses:  Tachypnea  Weakness  Confusion    New Prescriptions New Prescriptions   No medications on file     Karma Greaser, MD 03/13/16 1930    Carmin Muskrat, MD 03/16/16 [phone]

## 2016-03-13 NOTE — Discharge Instructions (Signed)
As discussed, your evaluation today has been largely reassuring.  But, it is important that you monitor your condition carefully, and do not hesitate to return to the ED if you develop new, or concerning changes in your condition. ? ?Otherwise, please follow-up with your physician for appropriate ongoing care. ? ?

## 2016-03-13 NOTE — ED Triage Notes (Signed)
Pt reports having unsteady gait, confusion and hallucinations x 3 days.

## 2016-03-14 ENCOUNTER — Encounter (HOSPITAL_COMMUNITY): Payer: Self-pay | Admitting: Emergency Medicine

## 2016-03-14 ENCOUNTER — Emergency Department (HOSPITAL_COMMUNITY)
Admission: EM | Admit: 2016-03-14 | Discharge: 2016-03-14 | Disposition: A | Discharge status: Home / Self Care | Payer: Medicare Other | Attending: Emergency Medicine | Admitting: Emergency Medicine

## 2016-03-14 ENCOUNTER — Emergency Department (HOSPITAL_COMMUNITY): Payer: Medicare Other

## 2016-03-14 DIAGNOSIS — I2510 Atherosclerotic heart disease of native coronary artery without angina pectoris: Secondary | ICD-10-CM | POA: Diagnosis not present

## 2016-03-14 DIAGNOSIS — Z85828 Personal history of other malignant neoplasm of skin: Secondary | ICD-10-CM | POA: Diagnosis not present

## 2016-03-14 DIAGNOSIS — R410 Disorientation, unspecified: Secondary | ICD-10-CM | POA: Diagnosis present

## 2016-03-14 DIAGNOSIS — Z87891 Personal history of nicotine dependence: Secondary | ICD-10-CM | POA: Insufficient documentation

## 2016-03-14 DIAGNOSIS — F0150 Vascular dementia without behavioral disturbance: Secondary | ICD-10-CM

## 2016-03-14 DIAGNOSIS — E039 Hypothyroidism, unspecified: Secondary | ICD-10-CM | POA: Diagnosis not present

## 2016-03-14 DIAGNOSIS — I130 Hypertensive heart and chronic kidney disease with heart failure and stage 1 through stage 4 chronic kidney disease, or unspecified chronic kidney disease: Secondary | ICD-10-CM | POA: Insufficient documentation

## 2016-03-14 DIAGNOSIS — Z7982 Long term (current) use of aspirin: Secondary | ICD-10-CM | POA: Diagnosis not present

## 2016-03-14 DIAGNOSIS — Z955 Presence of coronary angioplasty implant and graft: Secondary | ICD-10-CM | POA: Diagnosis not present

## 2016-03-14 DIAGNOSIS — R531 Weakness: Secondary | ICD-10-CM | POA: Diagnosis not present

## 2016-03-14 DIAGNOSIS — N183 Chronic kidney disease, stage 3 (moderate): Secondary | ICD-10-CM | POA: Insufficient documentation

## 2016-03-14 DIAGNOSIS — I252 Old myocardial infarction: Secondary | ICD-10-CM | POA: Diagnosis not present

## 2016-03-14 DIAGNOSIS — I5032 Chronic diastolic (congestive) heart failure: Secondary | ICD-10-CM | POA: Insufficient documentation

## 2016-03-14 LAB — CBC WITH DIFFERENTIAL/PLATELET
BASOS ABS: 0 10*3/uL (ref 0.0–0.1)
BASOS PCT: 0 %
Eosinophils Absolute: 0.2 10*3/uL (ref 0.0–0.7)
Eosinophils Relative: 4 %
HEMATOCRIT: 33.7 % — AB (ref 39.0–52.0)
HEMOGLOBIN: 11.5 g/dL — AB (ref 13.0–17.0)
Lymphocytes Relative: 33 %
Lymphs Abs: 1.7 10*3/uL (ref 0.7–4.0)
MCH: 31.3 pg (ref 26.0–34.0)
MCHC: 34.1 g/dL (ref 30.0–36.0)
MCV: 91.6 fL (ref 78.0–100.0)
MONO ABS: 0.5 10*3/uL (ref 0.1–1.0)
MONOS PCT: 11 %
NEUTROS ABS: 2.7 10*3/uL (ref 1.7–7.7)
NEUTROS PCT: 52 %
Platelets: 166 10*3/uL (ref 150–400)
RBC: 3.68 MIL/uL — ABNORMAL LOW (ref 4.22–5.81)
RDW: 13.4 % (ref 11.5–15.5)
WBC: 5.2 10*3/uL (ref 4.0–10.5)

## 2016-03-14 LAB — COMPREHENSIVE METABOLIC PANEL
ALBUMIN: 3.5 g/dL (ref 3.5–5.0)
ALK PHOS: 61 U/L (ref 38–126)
ALT: 19 U/L (ref 17–63)
AST: 25 U/L (ref 15–41)
Anion gap: 5 (ref 5–15)
BILIRUBIN TOTAL: 0.5 mg/dL (ref 0.3–1.2)
BUN: 14 mg/dL (ref 6–20)
CALCIUM: 9.4 mg/dL (ref 8.9–10.3)
CO2: 28 mmol/L (ref 22–32)
CREATININE: 1.4 mg/dL — AB (ref 0.61–1.24)
Chloride: 105 mmol/L (ref 101–111)
GFR calc Af Amer: 54 mL/min — ABNORMAL LOW (ref >60)
GFR, EST NON AFRICAN AMERICAN: 47 mL/min — AB (ref >60)
GLUCOSE: 149 mg/dL — AB (ref 65–99)
POTASSIUM: 4 mmol/L (ref 3.5–5.1)
Sodium: 138 mmol/L (ref 135–145)
TOTAL PROTEIN: 7 g/dL (ref 6.5–8.1)

## 2016-03-14 LAB — I-STAT TROPONIN, ED: Troponin i, poc: 0 ng/mL (ref 0.00–0.08)

## 2016-03-14 LAB — VITAMIN B12: VITAMIN B 12: 424 pg/mL (ref 180–914)

## 2016-03-14 LAB — FOLATE: FOLATE: 16.1 ng/mL (ref >5.9)

## 2016-03-14 LAB — T4, FREE: FREE T4: 0.96 ng/dL (ref 0.61–1.12)

## 2016-03-14 LAB — ETHANOL: Alcohol, Ethyl (B): <5 mg/dL (ref <5)

## 2016-03-14 LAB — CBG MONITORING, ED: Glucose-Capillary: 169 mg/dL — ABNORMAL HIGH (ref 65–99)

## 2016-03-14 LAB — TSH: TSH: 1.209 u[IU]/mL (ref 0.350–4.500)

## 2016-03-14 LAB — BRAIN NATRIURETIC PEPTIDE: B NATRIURETIC PEPTIDE 5: 21.6 pg/mL (ref 0.0–100.0)

## 2016-03-14 LAB — D-DIMER, QUANTITATIVE (NOT AT ARMC): D DIMER QUANT: 0.37 ug{FEU}/mL (ref 0.00–0.50)

## 2016-03-14 NOTE — Care Management Note (Addendum)
Case Management Note  Patient Details  Name: [name] MRN: [id number] Date of Birth: [birth year]  Subjective/Objective:   76 y.o. M seen in the ED for the second time in as many days for confusion and increased forgetfulness. CM received consult for assistance with [facility] needs and DME. [facility] called for RW, spouse refused referral to [facility] for HHPT the only agency which will provide care in that area.  Appears pt has been followed by [facility] in past without success. Children and current spouse are estranged which is a source of stress for this pt at current time.                    Action/Plan: CM will sign off for now but will be available should additional discharge needs arise or disposition change.    Expected Discharge Date:                  Expected Discharge Plan:  [place]  In-House Referral:  NA  Discharge planning Services  CM Consult  Post Acute Care Choice:  Durable Medical Equipment, Home Health Choice offered to:  Patient, Spouse  DME Arranged:  [place] DME Agency:  [place]:  PT, Social Work [employer] Agency:  [facility] [employer]  Status of Service:  Completed, signed off  If discussed at [employer], dates discussed:    Additional Comments:  Delrae Sawyers, RN 03/14/2016, 4:24 PM

## 2016-03-14 NOTE — Discharge Instructions (Signed)
Continue your current meds.   Case management and social work and home care will contact you.   See your doctor this week   Return to ER if he is agitated, unable to walk, worse unsteadiness and dizziness, chest pain, trouble breathing,.

## 2016-03-14 NOTE — ED Provider Notes (Signed)
[facility] DEPT Provider Note   CSN: [id number] Arrival date & time: 03/14/16  1043     History   Chief Complaint Chief Complaint  Patient presents with  . Altered Mental Status    HPI Leman Bolash is a 76 y.o. male.  HPI Low 5 caveat due to confusion. The patient presents with wife. States patient's had increasing confusion and hallucinations worse over the last few days. Has had increasing lethargy and daytime sleepiness. He also has had increased shortness of breath especially with any exertion. States with several pillows. No fever or chills. No new medications. Does not complain of any pain at this time. Past Medical History:  Diagnosis Date  . Anxiety   . Arthritis    "knees, back, shoulders" (10/22/2015)  . CAD (coronary artery disease)    a. remote stenting >20 years ago. b. PTCA unknown vessel ~2013. c. 10/2015: s/p rotational atherectomy/DES to distal RCA and RPDA c/b embolic CVA  . CHB (complete heart block) ([place]) 02/2015   [name] 03/03/2015  . Chronic diastolic CHF (congestive heart failure) ([place])   . Chronic shoulder pain   . [name] (chronic kidney disease), stage III   . Dilated aortic root ([place])    a. mild by echo 09/2015.  Marland Kitchen Heart attack 'many years ago'  . High cholesterol   . Hypertension   . OSA (obstructive sleep apnea)    "suppose to have a mask; they haven't got one adjusted for him yet" (10/22/2015)  . Presence of permanent cardiac pacemaker   . Sinus arrest   . Skin cancer of face    "had it cut off"  . Statin intolerance   . Symptomatic bradycardia    a. syncope/sinus arrest and bradycardia s/p [employer] PPM 02/2015 with lead revision 03/2015.    Patient Active Problem List   Diagnosis Date Noted  . Anemia, macrocytic 12/24/2015  . Frail elderly 11/14/2015  . History of heart artery stent 11/14/2015  . [name] stage G3a/A1, GFR 45-59 and albumin creatinine ratio <30 mg/g 11/05/2015  . Facial weakness   . S/P PTCA (percutaneous transluminal  coronary angioplasty)   . Hyperlipidemia   . Essential hypertension   . S/P [facility] Feb 2017 10/23/2015  . CAD-S/P remote PCI 20 yrs ago, ? 4 years ago, and s/p RCA PCI/DES 10/22/15 10/23/2015  . Acute on chronic alteration in mental status post PCI 10/23/2015  . Cerebral thrombosis with cerebral infarction 10/23/2015  . DOE (dyspnea on exertion)   . Accelerating angina ([place])   . Hypothyroidism 07/09/2015  . Fatigue due to depression 05/09/2015  . Moderate single current episode of major depressive disorder ([place]) 05/09/2015  . 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 Syncope 04/10/2015  . Faintness   . Retinal detachment, tractional, right eye 03/15/2015  . Hepatic cyst 03/11/2015  . Pacemaker 03/11/2015  . Abnormal electrocardiogram 03/10/2015  . Contracture of ankle and foot joint 03/10/2015  . Drug therapy 03/10/2015  . Dysphagia 03/10/2015  . Edema 03/10/2015  . History of ST elevation myocardial infarction (STEMI) 03/10/2015  . Low back pain 03/10/2015  . MCI (mild cognitive impairment) 03/10/2015  . Mediastinal lymphadenopathy 03/10/2015  . Obesity 03/10/2015  . Osteoarthritis 03/10/2015  . 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 Personal history of noncompliance with medical treatment, presenting hazards to health 03/10/2015  . 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 Symptomatic bradycardia 03/04/2015  . History of complete heart block 03/03/2015  . 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  Abnormal chest x-ray 02/19/2015  . Abnormal TSH 02/19/2015  . Anxiety about health 02/19/2015  . CAD in native artery 02/19/2015  . Diastolic CHF ([place]) [id number]  . 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 LVH (left ventricular hypertrophy) 02/19/2015  . Medical non-compliance 02/19/2015  . OSA (obstructive sleep apnea) 02/19/2015  . Prediabetes 02/19/2015  . Rotator cuff tear, right 02/19/2015  . Abnormal computed tomography scan  02/10/2015  . Abnormal CT of the chest 02/10/2015  . Coronary artery disease involving native coronary artery of native heart 01/23/2015  . Non-Q wave myocardial infarction ([place]) 09/09/2011    Past Surgical History:  Procedure Laterality Date  . CARDIAC CATHETERIZATION N/A 10/17/2015   Procedure: Left Heart Cath and Coronary Angiography;  Surgeon: Jettie Booze, MD;  Location: [place] [facility];  Service: Cardiovascular;  Laterality: N/A;  . CARDIAC CATHETERIZATION N/A 10/22/2015   Procedure: Coronary Stent Intervention Rotablater;  Surgeon: Jettie Booze, MD;  Location: [place] [facility];  Service: Cardiovascular;  Laterality: N/A;  . CARDIAC CATHETERIZATION N/A 10/22/2015   Procedure: Left Heart Cath and Coronary Angiography;  Surgeon: Jettie Booze, MD;  Location: [place] [facility];  Service: Cardiovascular;  Laterality: N/A;  . CATARACT EXTRACTION W/ INTRAOCULAR LENS  IMPLANT, BILATERAL Bilateral 2016  . CORONARY ANGIOPLASTY  2012  . CORONARY ANGIOPLASTY WITH STENT PLACEMENT  2001  . CORONARY ANGIOPLASTY WITH STENT PLACEMENT  10/22/2015   "took 2 out and put 1 longer one in"  . EP IMPLANTABLE DEVICE N/A 03/04/2015   Procedure: Pacemaker Implant;  Surgeon: Will Meredith Leeds, MD;  Location: [place] [facility];  Service: Cardiovascular;  Laterality: N/A;  . EP IMPLANTABLE DEVICE N/A 04/10/2015   Procedure: PPM Lead Revision/Repair;  Surgeon: Will Meredith Leeds, MD;  Location: [facility];  Service: Cardiovascular;  Laterality: N/A;  . EYE SURGERY    . INSERT / REPLACE / REMOVE PACEMAKER    . RETINAL DETACHMENT SURGERY Bilateral    "several on each side"       Home Medications    Prior to Admission medications   Medication Sig Start Date End Date Taking? Authorizing Provider  aspirin EC 81 MG tablet Take 81 mg by mouth daily.   Yes Historical Provider, MD  clopidogrel (PLAVIX) 75 MG tablet Take 1 tablet (75 mg total) by mouth daily. 10/20/15  Yes Arbutus Leas, NP  escitalopram (LEXAPRO) 10 MG tablet Take 10 mg by mouth daily.   Yes Historical Provider, MD  isosorbide mononitrate (IMDUR) 30 MG 24 hr tablet Take 1 tablet (30 mg total) by mouth daily. 10/17/15  Yes Jettie Booze, MD  levothyroxine (SYNTHROID, LEVOTHROID) 25 MCG tablet Take 25 mcg by mouth daily before breakfast.   Yes Historical Provider, MD  metoprolol succinate (TOPROL XL) 50 MG 24 hr tablet Take 1 tablet (50 mg total) by mouth daily. Take with or immediately following a meal. 07/02/15  Yes Will Meredith Leeds, MD  Multiple Vitamin (MULTIVITAMIN WITH MINERALS) TABS tablet Take 1 tablet by mouth daily as needed (takes occasionally).    Yes Historical Provider, MD  Omega-3 1000 MG CAPS Take 1 g by mouth daily.    Yes Historical Provider, MD  ramipril (ALTACE) 2.5 MG capsule Take 2.5 mg by mouth at bedtime.    Yes Historical Provider, MD  torsemide (DEMADEX) 20 MG tablet Take 20 mg by mouth every morning. 03/03/16  Yes Historical Provider, MD  nitroGLYCERIN (NITROSTAT) 0.4 MG SL tablet  Place 0.4 mg under the tongue daily as needed for chest pain. Chest pain    Historical Provider, MD    Family History Family History  Problem Relation Age of Onset  . Diabetes Sister   . Diabetes Brother     Social History Social History  Substance Use Topics  . Smoking status: Former Smoker    Packs/day: 1.00    Years: 10.00    Types: Cigarettes  . Smokeless tobacco: Never Used     Comment: "quit smoking in my early 20's"  . Alcohol use Yes     Comment: 10/22/2015 "might have 1 glass of wine/month, if that     Allergies   Novocain [procaine]; Rosuvastatin calcium; and Statins   Review of Systems Review of Systems  Unable to perform ROS: Dementia     Physical Exam Updated Vital Signs BP (!) 165/102   Pulse 62   Temp 97.8 F (36.6 C) (Oral)   Resp 18   Ht 6' (1.829 m)   Wt 238 lb (108 kg)   SpO2 91%   BMI 32.28 kg/m   Physical Exam  Constitutional: He appears  well-developed and well-nourished.  Patient is drowsy but easily aroused.  HENT:  Head: Normocephalic and atraumatic.  Dry mucous membranes  Eyes: EOM are normal. Pupils are equal, round, and reactive to light.  Neck: Normal range of motion. Neck supple.  No meningismus  Cardiovascular: Normal rate and regular rhythm.  Exam reveals no gallop and no friction rub.   No murmur heard. Pulmonary/Chest: Effort normal and breath sounds normal.  Diminished breath sounds in bilateral bases.  Abdominal: Soft. Bowel sounds are normal. There is no tenderness. There is no rebound and no guarding.  Musculoskeletal: Normal range of motion. He exhibits no edema or tenderness.  1+ bilateral pitting edema. No calf swelling or asymmetry.  Neurological: He is alert.  Patient is oriented only to person. Moves all extremities without any focal deficit. Sensation is intact.  Skin: Skin is warm and dry. Capillary refill takes less than 2 seconds. No rash noted. No erythema.  Psychiatric: He has a normal mood and affect. His behavior is normal.  Nursing note and vitals reviewed.    ED Treatments / Results  Labs (all labs ordered are listed, but only abnormal results are displayed) Labs Reviewed  CBC WITH DIFFERENTIAL/PLATELET - Abnormal; Notable for the following:       Result Value   RBC 3.68 (*)    Hemoglobin 11.5 (*)    HCT 33.7 (*)    All other components within normal limits  COMPREHENSIVE METABOLIC PANEL - Abnormal; Notable for the following:    Glucose, Bld 149 (*)    Creatinine, Ser 1.40 (*)    GFR calc non Af Amer 47 (*)    GFR calc Af Amer 54 (*)    All other components within normal limits  CBG MONITORING, ED - Abnormal; Notable for the following:    Glucose-Capillary 169 (*)    All other components within normal limits  BRAIN NATRIURETIC PEPTIDE  D-DIMER, QUANTITATIVE (NOT AT [facility])  ETHANOL  TSH  T4, FREE  VITAMIN B12  FOLATE  I-STAT TROPOININ, ED    EKG  EKG  Interpretation None       Radiology Ct Head Wo Contrast  Result Date: 03/13/2016 CLINICAL DATA:  Altered mental status, unsteady gait, confusion and hallucinations. EXAM: CT HEAD WITHOUT CONTRAST TECHNIQUE: Contiguous axial images were obtained from the base of the skull through the  vertex without intravenous contrast. COMPARISON:  Head CT dated 10/25/2015. FINDINGS: Brain: There is generalized parenchymal atrophy with commensurate dilatation of the ventricles and sulci. Mild chronic small vessel ischemic changes within the deep periventricular white matter. There is no mass, hemorrhage, edema or other evidence of acute parenchymal abnormality. No extra-axial hemorrhage. Vascular: No hyperdense vessel or unexpected calcification. Skull: Normal. Negative for fracture or focal lesion. Sinuses/Orbits: Paranasal sinuses are clear. The hyperdense right orbit is unchanged. Other: None. IMPRESSION: No acute findings.  No intracranial mass, hemorrhage or edema. Atrophy and chronic ischemic changes in the periventricular white matter. Electronically Signed   By: Franki Cabot M.D.   On: 03/13/2016 18:06   Dg Chest Port 1 View  Result Date: 03/14/2016 CLINICAL DATA:  Confusion. EXAM: PORTABLE CHEST 1 VIEW COMPARISON:  March 13, 2016 FINDINGS: There is an elevated right hemidiaphragm. The left hemidiaphragm is now partially obscured. Suggested small layering effusions. The cardiomediastinal silhouette is unchanged. No pneumothorax. Stable pacemaker. Stable cardiomegaly. IMPRESSION: 1. Partial obscuration of the left hemidiaphragm, new in the interval, suggests a small layering effusion and underlying atelectasis. A small layering effusion is not excluded on the right. A PA and lateral chest x-ray could better evaluate. Electronically Signed   By: Dorise Bullion III M.D   On: 03/14/2016 12:45   Dg Chest Port 1 View  Result Date: 03/13/2016 CLINICAL DATA:  Tachypnea.  History of hypertension. EXAM: PORTABLE CHEST 1  VIEW COMPARISON:  04/11/2015 FINDINGS: Patient's left-sided transvenous pacemaker with leads to the right atrium and right ventricle. Heart is mildly enlarged. Stable elevation of right hemidiaphragm. Lungs are clear. No pulmonary edema. IMPRESSION: Stable cardiomegaly. Electronically Signed   By: Nolon Nations M.D.   On: 03/13/2016 17:21    Procedures Procedures (including critical care time)  Medications Ordered in ED Medications - No data to display   Initial Impression / Assessment and Plan / ED Course  I have reviewed the triage vital signs and the nursing notes.  Pertinent labs & imaging results that were available during my care of the patient were reviewed by me and considered in my medical decision making (see chart for details).     Discussed with neurology. Recommends folate, thyroid studies and B12 and B1 levels. Advised outpatient workup for confusion. Will consult case management regarding home health needs.  Final Clinical Impressions(s) / ED Diagnoses   Final diagnoses:  Weakness  Vascular dementia without behavioral disturbance    New Prescriptions Discharge Medication List as of 03/14/2016  6:04 PM       Julianne Rice, MD 03/15/16 [phone]

## 2016-03-14 NOTE — ED Notes (Signed)
Pt did not need anything at this time  

## 2016-03-14 NOTE — ED Notes (Signed)
PT did not have to use RR at this time  

## 2016-03-14 NOTE — ED Triage Notes (Signed)
Wife stated, I was here yesterday with the same problem of being confused and sent [place] home. He is still confused. Bilateral grips are equal Unable to tell me the day, year or president.

## 2016-03-14 NOTE — ED Provider Notes (Signed)
  Physical Exam  BP (!) 165/102   Pulse 62   Temp 97.8 F (36.6 C) (Oral)   Resp 18   Ht 6' (1.829 m)   Wt 238 lb (108 kg)   SpO2 91%   BMI 32.28 kg/m   Physical Exam  ED Course  Procedures  MDM Patient care assumed at sign out. Patient hx of dementia and has been more confused and less steady. Was seen yesterday and CT head unremarkable. Came back today since he is still unsteady. Repeat labs unremarkable. Patient was given walker and able to ambulate with walker. Case management consulted. Patient lives in [place] and there is only one home care agency that goes there. Wife is not interested in nursing home placement. We were able to provide him with a walker. Dr. Lita Mains consulted Neurology about dementia workup and neuro recommended Vit B12, TSH (nl yesterday), folate, Vit B1, which won't come back today. I ordered home health services for patient. Doesn't meet admission criteria currently.       Drenda Freeze, MD 03/14/16 [phone]

## 2016-03-14 NOTE — ED Notes (Signed)
Blood drawn  Pt alert no distress

## 2016-03-14 NOTE — ED Triage Notes (Signed)
Dr. Lita Mains stated, to hold off on labs, etc until he is assessed.

## 2016-03-14 NOTE — ED Notes (Signed)
Doctor in with the pt

## 2016-03-14 NOTE — ED Notes (Signed)
ED Provider at bedside. 

## 2016-03-30 ENCOUNTER — Ambulatory Visit (INDEPENDENT_AMBULATORY_CARE_PROVIDER_SITE_OTHER): Payer: Medicare Other | Admitting: *Deleted

## 2016-03-30 DIAGNOSIS — I442 Atrioventricular block, complete: Secondary | ICD-10-CM | POA: Diagnosis not present

## 2016-03-31 ENCOUNTER — Encounter: Payer: Self-pay | Admitting: Cardiology

## 2016-03-31 NOTE — Progress Notes (Signed)
Remote pacemaker transmission.   

## 2016-04-01 LAB — CUP PACEART REMOTE DEVICE CHECK
Battery Remaining Percentage: 95.5 %
Battery Voltage: 3.01 V
Brady Statistic AP VP Percent: 1 %
Brady Statistic AS VS Percent: 28 %
Brady Statistic RA Percent Paced: 70 %
Brady Statistic RV Percent Paced: 1.2 %
Implantable Lead Implant Date: 20170221
Implantable Lead Location: 753859
Implantable Lead Location: 753860
Implantable Pulse Generator Implant Date: 20170221
Lead Channel Impedance Value: 380 Ohm
Lead Channel Pacing Threshold Amplitude: 0.375 V
Lead Channel Pacing Threshold Pulse Width: 0.5 ms
Lead Channel Sensing Intrinsic Amplitude: 2.5 mV
Lead Channel Setting Sensing Sensitivity: 0.7 mV
MDC IDC LEAD IMPLANT DT: 20170221
MDC IDC MSMT BATTERY REMAINING LONGEVITY: 118 mo
MDC IDC MSMT LEADCHNL RA IMPEDANCE VALUE: 440 Ohm
MDC IDC MSMT LEADCHNL RV PACING THRESHOLD AMPLITUDE: 0.875 V
MDC IDC MSMT LEADCHNL RV PACING THRESHOLD PULSEWIDTH: 0.5 ms
MDC IDC MSMT LEADCHNL RV SENSING INTR AMPL: 11.9 mV
MDC IDC PG SERIAL: 7877392
MDC IDC SESS DTM: 20180320163322
MDC IDC SET LEADCHNL RA PACING AMPLITUDE: 1.375
MDC IDC SET LEADCHNL RV PACING AMPLITUDE: 1.125
MDC IDC SET LEADCHNL RV PACING PULSEWIDTH: 0.5 ms
MDC IDC STAT BRADY AP VS PERCENT: 70 %
MDC IDC STAT BRADY AS VP PERCENT: 1 %

## 2016-04-08 DIAGNOSIS — R600 Localized edema: Secondary | ICD-10-CM | POA: Insufficient documentation

## 2016-04-14 ENCOUNTER — Encounter: Payer: Self-pay | Admitting: Cardiology

## 2016-06-29 ENCOUNTER — Ambulatory Visit (INDEPENDENT_AMBULATORY_CARE_PROVIDER_SITE_OTHER): Payer: Medicare Other | Admitting: *Deleted

## 2016-06-29 ENCOUNTER — Telehealth: Payer: Self-pay | Admitting: Cardiology

## 2016-06-29 DIAGNOSIS — I442 Atrioventricular block, complete: Secondary | ICD-10-CM | POA: Diagnosis not present

## 2016-06-29 NOTE — Telephone Encounter (Signed)
LMOVM reminding pt to send remote transmission.   

## 2016-07-01 NOTE — Progress Notes (Signed)
Remote pacemaker transmission.   

## 2016-07-02 ENCOUNTER — Telehealth: Payer: Self-pay | Admitting: Interventional Cardiology

## 2016-07-02 ENCOUNTER — Encounter: Payer: Self-pay | Admitting: Cardiology

## 2016-07-02 NOTE — Telephone Encounter (Signed)
°  New Prob   Has some questions regarding heart monitor. States she has not been able to get a hold of monitor technician and requesting to speak to a nurse. Please call.

## 2016-07-06 NOTE — Telephone Encounter (Signed)
Follow Up:; ° ° °Returning your call. °

## 2016-07-06 NOTE — Telephone Encounter (Signed)
Informed Stanton Kidney that monitor is working. [name] verbalized understanding.

## 2016-07-06 NOTE — Telephone Encounter (Signed)
Left message for patient to call back  

## 2016-07-06 NOTE — Telephone Encounter (Signed)
Spoke with patient's wife and she states that she was having trouble getting the monitor to connect to his device to send a remote transmission. She states that she tried to call the number provided to troubleshoot, but was never able to get through. Made wife aware that information would be forwarded to device clinic.   Wife also requesting an appointment with Dr. Irish Lack for his 6 month F/U. Wife also wanted refills for plavix and imdur. Called refills in to [facility] in [place].

## 2016-07-07 LAB — CUP PACEART REMOTE DEVICE CHECK
Battery Voltage: 3.02 V
Brady Statistic AP VP Percent: 1 %
Brady Statistic RA Percent Paced: 69 %
Date Time Interrogation Session: 20180619194937
Implantable Lead Implant Date: 20170221
Implantable Lead Location: 753859
Implantable Pulse Generator Implant Date: 20170221
Lead Channel Impedance Value: 390 Ohm
Lead Channel Pacing Threshold Amplitude: 0.375 V
Lead Channel Pacing Threshold Pulse Width: 0.5 ms
Lead Channel Setting Sensing Sensitivity: 0.7 mV
MDC IDC LEAD IMPLANT DT: 20170221
MDC IDC LEAD LOCATION: 753860
MDC IDC MSMT BATTERY REMAINING LONGEVITY: 125 mo
MDC IDC MSMT BATTERY REMAINING PERCENTAGE: 95.5 %
MDC IDC MSMT LEADCHNL RA IMPEDANCE VALUE: 400 Ohm
MDC IDC MSMT LEADCHNL RA SENSING INTR AMPL: 3.2 mV
MDC IDC MSMT LEADCHNL RV PACING THRESHOLD AMPLITUDE: 0.875 V
MDC IDC MSMT LEADCHNL RV PACING THRESHOLD PULSEWIDTH: 0.5 ms
MDC IDC MSMT LEADCHNL RV SENSING INTR AMPL: 11.9 mV
MDC IDC PG SERIAL: 7877392
MDC IDC SET LEADCHNL RA PACING AMPLITUDE: 1.375
MDC IDC SET LEADCHNL RV PACING AMPLITUDE: 1.125
MDC IDC SET LEADCHNL RV PACING PULSEWIDTH: 0.5 ms
MDC IDC STAT BRADY AP VS PERCENT: 69 %
MDC IDC STAT BRADY AS VP PERCENT: 1 %
MDC IDC STAT BRADY AS VS PERCENT: 30 %
MDC IDC STAT BRADY RV PERCENT PACED: 1.1 %
Pulse Gen Model: 2240

## 2016-07-09 ENCOUNTER — Ambulatory Visit (INDEPENDENT_AMBULATORY_CARE_PROVIDER_SITE_OTHER): Payer: Medicare Other | Admitting: Interventional Cardiology

## 2016-07-09 ENCOUNTER — Encounter: Payer: Self-pay | Admitting: Interventional Cardiology

## 2016-07-09 VITALS — BP 128/78 | HR 62 | Ht 72.0 in | Wt 237.6 lb

## 2016-07-09 DIAGNOSIS — G4733 Obstructive sleep apnea (adult) (pediatric): Secondary | ICD-10-CM | POA: Diagnosis not present

## 2016-07-09 DIAGNOSIS — G3184 Mild cognitive impairment, so stated: Secondary | ICD-10-CM | POA: Diagnosis not present

## 2016-07-09 DIAGNOSIS — Z9861 Coronary angioplasty status: Secondary | ICD-10-CM

## 2016-07-09 DIAGNOSIS — E782 Mixed hyperlipidemia: Secondary | ICD-10-CM | POA: Diagnosis not present

## 2016-07-09 DIAGNOSIS — I2510 Atherosclerotic heart disease of native coronary artery without angina pectoris: Secondary | ICD-10-CM

## 2016-07-09 NOTE — Progress Notes (Signed)
Cardiology Office Note   Date:  07/09/2016   ID:  Hendrix, Brandon May 15, 1940, MRN [id number]  PCP:  Maris Berger, MD    No chief complaint on file. CAD   Wt Readings from Last 3 Encounters:  07/09/16 237 lb 9.6 oz (107.8 kg)  03/14/16 238 lb (108 kg)  12/29/15 224 lb (101.6 kg)       History of Present Illness: [name] is a 76 y.o. male   with history of CAD (remote stenting >20 years ago, PTCA unknown vessel ~2013, s/p rotational atherectomy/DES to distal RCA and RPDA c/b embolic CVA), OSA, syncope with bradycardia/sinus arrest 02/2015 s/p StJ PPM (lead revision 03/2015), obesity, chronic diastolic CHF, [name] stage III, HTN, statin intolerance, mildly dilated aortic root who presents for follow-up.  2D echo 09/26/15: mild LVH, EF 55-60%, grade 1 DD, mildly dilated aortic root, calcified MV annulus. Due to suspected angina, he underwent LHC 10/17/15 showing 80% mLAD extending into ostial 2nd diag 90% stenosed, 90% dRCA, 90% RPDA (suspected ISR), 25% ostial-mid Cx, EF 55-60%. LV was normal. He was started on Imdur and Plavix with a plan to bring back for intervention if unable to improve his anginal symptoms. His anginal symptoms did return shortly after having undergone cath. On 10/22/15, he underwent rotational atherectomy to distal RCA and RPDA lesions, felt to be culprits for his angina. The following day he developedconfusion, garbled speech, and left facial droop. CT head showed no acute abnormalities; MRI unable to be performed due to pacemaker. CTA head neck showed atherosclerosis including bilateral siphon and left VA origin. Neurology saw and recommended continuation of DAPT with ASA/Plavix. It was felt his CVA was embolic post-cath. Neurology did not feel he needed neuro f/u. His pacemaker was interrogated and showed no AF.  He has not completed his most recent eval for OSA.  He has tried CPAP in the past but could not tolerate the mask.  He does not want to f/u  in [place] with the sleep doctor there.   He has memory issues as well.  The patient does not recall any angina or NTG use.  The wife states he used it a few days ago, but his memory is poor, and this is causing a w/u for dementia.   They were going to relocate to [facility], but due to his M-I-L illness, they came back.        Past Medical History:  Diagnosis Date  . Anxiety   . Arthritis    "knees, back, shoulders" (10/22/2015)  . CAD (coronary artery disease)    a. remote stenting >20 years ago. b. PTCA unknown vessel ~2013. c. 10/2015: s/p rotational atherectomy/DES to distal RCA and RPDA c/b embolic CVA  . CHB (complete heart block) ([place]) 02/2015   [name] 03/03/2015  . Chronic diastolic CHF (congestive heart failure) ([place])   . Chronic shoulder pain   . [name] (chronic kidney disease), stage III   . Dilated aortic root ([place])    a. mild by echo 09/2015.  Marland Kitchen Heart attack [facility]) 'many years ago'  . High cholesterol   . Hypertension   . OSA (obstructive sleep apnea)    "suppose to have a mask; they haven't got one adjusted for him yet" (10/22/2015)  . Presence of permanent cardiac pacemaker   . Sinus arrest   . Skin cancer of face    "had it cut off"  . Statin intolerance   . Symptomatic bradycardia    a.  syncope/sinus arrest and bradycardia s/p St. Jude PPM 02/2015 with lead revision 03/2015.    Past Surgical History:  Procedure Laterality Date  . CARDIAC CATHETERIZATION N/A 10/17/2015   Procedure: Left Heart Cath and Coronary Angiography;  Surgeon: Jettie Booze, MD;  Location: [place] [facility];  Service: Cardiovascular;  Laterality: N/A;  . CARDIAC CATHETERIZATION N/A 10/22/2015   Procedure: Coronary Stent Intervention Rotablater;  Surgeon: Jettie Booze, MD;  Location: [place] [facility];  Service: Cardiovascular;  Laterality: N/A;  . CARDIAC CATHETERIZATION N/A 10/22/2015   Procedure: Left Heart Cath and Coronary Angiography;  Surgeon: Jettie Booze, MD;   Location: [place] [facility];  Service: Cardiovascular;  Laterality: N/A;  . CATARACT EXTRACTION W/ INTRAOCULAR LENS  IMPLANT, BILATERAL Bilateral 2016  . CORONARY ANGIOPLASTY  2012  . CORONARY ANGIOPLASTY WITH STENT PLACEMENT  2001  . CORONARY ANGIOPLASTY WITH STENT PLACEMENT  10/22/2015   "took 2 out and put 1 longer one in"  . EP IMPLANTABLE DEVICE N/A 03/04/2015   Procedure: Pacemaker Implant;  Surgeon: Will Meredith Leeds, MD;  Location: [place] [facility];  Service: Cardiovascular;  Laterality: N/A;  . EP IMPLANTABLE DEVICE N/A 04/10/2015   Procedure: PPM Lead Revision/Repair;  Surgeon: Will Meredith Leeds, MD;  Location: [place] [facility];  Service: Cardiovascular;  Laterality: N/A;  . EYE SURGERY    . INSERT / REPLACE / REMOVE PACEMAKER    . RETINAL DETACHMENT SURGERY Bilateral    "several on each side"     Current Outpatient Prescriptions  Medication Sig Dispense Refill  . acetaminophen (TYLENOL) 500 MG tablet Take 500 mg by mouth every 6 (six) hours as needed for headache.    Marland Kitchen aspirin EC 81 MG tablet Take 81 mg by mouth daily.    . clopidogrel (PLAVIX) 75 MG tablet Take 1 tablet (75 mg total) by mouth daily. 30 tablet 12  . escitalopram (LEXAPRO) 10 MG tablet Take 10 mg by mouth daily.    . isosorbide mononitrate (IMDUR) 30 MG 24 hr tablet Take 1 tablet (30 mg total) by mouth daily. 30 tablet 11  . levothyroxine (SYNTHROID, LEVOTHROID) 25 MCG tablet Take 25 mcg by mouth daily before breakfast.    . metoprolol succinate (TOPROL XL) 50 MG 24 hr tablet Take 1 tablet (50 mg total) by mouth daily. Take with or immediately following a meal. 90 tablet 3  . Multiple Vitamin (MULTIVITAMIN WITH MINERALS) TABS tablet Take 1 tablet by mouth daily as needed (takes occasionally).     . nitroGLYCERIN (NITROSTAT) 0.4 MG SL tablet Place 0.4 mg under the tongue daily as needed for chest pain. Chest pain    . Omega-3 1000 MG CAPS Take 1 g by mouth daily.     . ramipril (ALTACE) 2.5 MG capsule  Take 2.5 mg by mouth at bedtime.     . torsemide (DEMADEX) 20 MG tablet Take 20 mg by mouth every morning.    . vitamin B-12 (CYANOCOBALAMIN) 1000 MCG tablet Take 1,000 mcg by mouth daily.     No current facility-administered medications for this visit.     Allergies:   Novocain [procaine]; Rosuvastatin calcium; and Statins    Social History:  The patient  reports that he has quit smoking. His smoking use included Cigarettes. He has a 10.00 pack-year smoking history. He has never used smokeless tobacco. He reports that he drinks alcohol. He reports that he does not use drugs.   Family History:  The patient's family history  includes Diabetes in his brother and sister.    ROS:  Please see the history of present illness.   Otherwise, review of systems are positive for decreasing memory.   All other systems are reviewed and negative.    PHYSICAL EXAM: VS:  BP 128/78   Pulse 62   Ht 6' (1.829 m)   Wt 237 lb 9.6 oz (107.8 kg)   BMI 32.22 kg/m  , BMI Body mass index is 32.22 kg/m. GEN: Well nourished, well developed, in no acute distress  HEENT: normal  Neck: no JVD, carotid bruits, or masses Cardiac: RRR; no murmurs, rubs, or gallops,no edema  Respiratory:  clear to auscultation bilaterally, normal work of breathing GI: soft, nontender, nondistended, + BS MS: no deformity or atrophy  Skin: warm and dry, no rash Neuro:  Strength and sensation are intact Psych: euthymic mood, full affect    Recent Labs: 03/14/2016: ALT 19; B Natriuretic Peptide 21.6; BUN 14; Creatinine, Ser 1.40; Hemoglobin 11.5; Platelets 166; Potassium 4.0; Sodium 138; TSH 1.209   Lipid Panel    Component Value Date/Time   CHOL 140 10/24/2015 0437   TRIG 106 10/24/2015 0437   HDL 31 (L) 10/24/2015 0437   CHOLHDL 4.5 10/24/2015 0437   VLDL 21 10/24/2015 0437   LDLCALC 88 10/24/2015 0437     Other studies Reviewed: Additional studies/ records that were reviewed today with results demonstrating: LDL 88 on  2017, A1C 6.1.   ASSESSMENT AND PLAN:  1. CAD: Continue aggressive secondary prevention.  He had complex PCI to the RCA and PDA.  Continue DAPT.  He does not recall any angina.  2. I suggested a neuro eval through his PMD for dementia. 3. Can look into sleep apnea or night time hypoxia after the neuro eval.   4. PMD checked cholesterol per the wife and it was high. Patient has been intolerant of statins. Now off statins.  Could consider PCSK-9 inhibitor.  WIll have to obtain that result.    Current medicines are reviewed at length with the patient today.  The patient concerns regarding his medicines were addressed.  The following changes have been made:  No change  Labs/ tests ordered today include:  No orders of the defined types were placed in this encounter.   Recommend 150 minutes/week of aerobic exercise Low fat, low carb, high fiber diet recommended  Disposition:   FU in *6 months   Signed, Larae Grooms, MD  07/09/2016 2:33 PM    [facility] [place], [place], [place]  [address] Phone: [phone]; Fax: [phone]

## 2016-07-09 NOTE — Patient Instructions (Addendum)
Medication Instructions:  Your physician recommends that you continue on your current medications as directed. Please refer to the Current Medication list given to you today.   Labwork: None ordered  Testing/Procedures: None ordered  Follow-Up: Your physician wants you to follow-up in: 6 months with Dr. Varanasi.    Any Other Special Instructions Will Be Listed Below (If Applicable).     If you need a refill on your cardiac medications before your next appointment, please call your pharmacy.   

## 2016-07-16 ENCOUNTER — Encounter: Payer: Self-pay | Admitting: Cardiology

## 2016-07-20 DIAGNOSIS — R269 Unspecified abnormalities of gait and mobility: Secondary | ICD-10-CM | POA: Insufficient documentation

## 2016-07-26 ENCOUNTER — Encounter: Payer: Self-pay | Admitting: Cardiology

## 2016-07-26 ENCOUNTER — Ambulatory Visit (INDEPENDENT_AMBULATORY_CARE_PROVIDER_SITE_OTHER): Payer: Medicare Other | Admitting: Cardiology

## 2016-07-26 VITALS — BP 116/82 | HR 64 | Ht 72.0 in | Wt 239.6 lb

## 2016-07-26 DIAGNOSIS — R001 Bradycardia, unspecified: Secondary | ICD-10-CM | POA: Diagnosis not present

## 2016-07-26 DIAGNOSIS — I25118 Atherosclerotic heart disease of native coronary artery with other forms of angina pectoris: Secondary | ICD-10-CM | POA: Diagnosis not present

## 2016-07-26 DIAGNOSIS — Z950 Presence of cardiac pacemaker: Secondary | ICD-10-CM | POA: Diagnosis not present

## 2016-07-26 MED ORDER — METOPROLOL SUCCINATE ER 25 MG PO TB24: 25.0000 mg | ORAL_TABLET | Freq: Every day | ORAL | 3 refills | Status: DC

## 2016-07-26 NOTE — Progress Notes (Signed)
Electrophysiology Office Note   Date:  07/26/2016   ID:  [name], DOB February 13, 1940, MRN [id number]  PCP:  Maris Berger, MD Primary Electrophysiologist:  Constance Haw, MD    Chief Complaint  Patient presents with  . Pacemaker Check    Symptomatic Bradycardia     History of Present Illness: [name] is a 76 y.o. male who presents today for electrophysiology evaluation.   He presented to the hospital on 2/20 with syncope.  He underwent implantation of a SJM dual chamber pacemaker.  Since that time, he has had a device check which showed R waves that had decreased from 17.9 to 3.7 mV.  Lead revision performed on 04/10/15. He had a left heart catheterization in October 2017 due to angina. This required rotational atherectomy to the RCA and distal PDA. Postop he had confusion, garbled speech, and left facial droop. Neurology was consulted and felt that this was due to a CVA. MRI was not performed due to his pacemaker.  Today, denies symptoms of palpitations, chest pain, shortness of breath, orthopnea, PND, lower extremity edema, claudication, dizziness, presyncope, syncope, bleeding, or neurologic sequela. The patient is tolerating medications without difficulties and is otherwise without complaint today. He does have quite a few episodes of fatigue. Per his wife, he sleeps most of the day and takes naps during waking hours. He has been sleeping until 12:00. This did start a few weeks ago. It is consistently been getting worse.    Past Medical History:  Diagnosis Date  . Anxiety   . Arthritis    "knees, back, shoulders" (10/22/2015)  . CAD (coronary artery disease)    a. remote stenting >20 years ago. b. PTCA unknown vessel ~2013. c. 10/2015: s/p rotational atherectomy/DES to distal RCA and RPDA c/b embolic CVA  . CHB (complete heart block) ([place]) 02/2015   [name] 03/03/2015  . Chronic diastolic CHF (congestive heart failure) ([place])   . Chronic shoulder pain   . [name]  (chronic kidney disease), stage III   . Dilated aortic root ([place])    a. mild by Brandon 09/2015.  Marland Kitchen Heart attack [facility]) 'many years ago'  . High cholesterol   . Hypertension   . OSA (obstructive sleep apnea)    "suppose to have a mask; they haven't got one adjusted for him yet" (10/22/2015)  . Presence of permanent cardiac pacemaker   . Sinus arrest   . Skin cancer of face    "had it cut off"  . Statin intolerance   . Symptomatic bradycardia    a. syncope/sinus arrest and bradycardia s/p St. Jude PPM 02/2015 with lead revision 03/2015.   Past Surgical History:  Procedure Laterality Date  . CARDIAC CATHETERIZATION N/A 10/17/2015   Procedure: Left Heart Cath and Coronary Angiography;  Surgeon: Jettie Booze, MD;  Location: [place] [facility];  Service: Cardiovascular;  Laterality: N/A;  . CARDIAC CATHETERIZATION N/A 10/22/2015   Procedure: Coronary Stent Intervention Rotablater;  Surgeon: Jettie Booze, MD;  Location: [place] [facility];  Service: Cardiovascular;  Laterality: N/A;  . CARDIAC CATHETERIZATION N/A 10/22/2015   Procedure: Left Heart Cath and Coronary Angiography;  Surgeon: Jettie Booze, MD;  Location: [place] [facility];  Service: Cardiovascular;  Laterality: N/A;  . CATARACT EXTRACTION W/ INTRAOCULAR LENS  IMPLANT, BILATERAL Bilateral 2016  . CORONARY ANGIOPLASTY  2012  . CORONARY ANGIOPLASTY WITH STENT PLACEMENT  2001  . CORONARY ANGIOPLASTY WITH STENT PLACEMENT  10/22/2015   "took 2 out  and put 1 longer one in"  . EP IMPLANTABLE DEVICE N/A 03/04/2015   Procedure: Pacemaker Implant;  Surgeon: Udell Blasingame Meredith Leeds, MD;  Location: [place] [facility];  Service: Cardiovascular;  Laterality: N/A;  . EP IMPLANTABLE DEVICE N/A 04/10/2015   Procedure: PPM Lead Revision/Repair;  Surgeon: Depaul Arizpe Meredith Leeds, MD;  Location: [place] [facility];  Service: Cardiovascular;  Laterality: N/A;  . EYE SURGERY    . INSERT / REPLACE / REMOVE PACEMAKER    . RETINAL DETACHMENT  SURGERY Bilateral    "several on each side"     Current Outpatient Prescriptions  Medication Sig Dispense Refill  . acetaminophen (TYLENOL) 500 MG tablet Take 500 mg by mouth every 6 (six) hours as needed for headache.    Marland Kitchen aspirin EC 81 MG tablet Take 81 mg by mouth daily.    . clopidogrel (PLAVIX) 75 MG tablet Take 1 tablet (75 mg total) by mouth daily. 30 tablet 12  . Cyanocobalamin (VITAMIN B-12) 1000 MCG/15ML LIQD Take 1 mL by mouth daily.    Marland Kitchen escitalopram (LEXAPRO) 10 MG tablet Take 10 mg by mouth daily.    . isosorbide mononitrate (IMDUR) 30 MG 24 hr tablet Take 1 tablet (30 mg total) by mouth daily. 30 tablet 11  . levothyroxine (SYNTHROID, LEVOTHROID) 25 MCG tablet Take 25 mcg by mouth daily before breakfast.    . metoprolol succinate (TOPROL XL) 50 MG 24 hr tablet Take 1 tablet (50 mg total) by mouth daily. Take with or immediately following a meal. 90 tablet 3  . Multiple Vitamin (MULTIVITAMIN WITH MINERALS) TABS tablet Take 1 tablet by mouth daily as needed (takes occasionally).     . nitroGLYCERIN (NITROSTAT) 0.4 MG SL tablet Place 0.4 mg under the tongue daily as needed for chest pain. Chest pain    . Omega-3 1000 MG CAPS Take 1 g by mouth daily.     . ramipril (ALTACE) 2.5 MG capsule Take 2.5 mg by mouth at bedtime.     . torsemide (DEMADEX) 20 MG tablet Take 20 mg by mouth every morning.     No current facility-administered medications for this visit.     Allergies:   Novocain [procaine]; Rosuvastatin calcium; and Statins   Social History:  The patient  reports that he has quit smoking. His smoking use included Cigarettes. He has a 10.00 pack-year smoking history. He has never used smokeless tobacco. He reports that he drinks alcohol. He reports that he does not use drugs.   Family History:  The patient's family history includes Diabetes in his brother and sister.    ROS:  Please see the history of present illness.   Otherwise, review of systems is positive for  fatigue, SOB, wheezing, depression, anxiety, joint swelling, balance problems, muscle pain, headaches, easy bruising.   All other systems are reviewed and negative.   PHYSICAL EXAM: VS:  BP 116/82   Pulse 64   Ht 6' (1.829 m)   Wt 239 lb 9.6 oz (108.7 kg)   BMI 32.50 kg/m  , BMI Body mass index is 32.5 kg/m. GEN: Well nourished, well developed, in no acute distress  HEENT: normal  Neck: no JVD, carotid bruits, or masses Cardiac: RRR; no murmurs, rubs, or gallops,no edema  Respiratory:  clear to auscultation bilaterally, normal work of breathing GI: soft, nontender, nondistended, + BS MS: no deformity or atrophy  Skin: warm and dry, device site well healed Neuro:  Strength and sensation are intact Psych: euthymic mood, full  affect  EKG:  EKG is not ordered today. Personal review of the ekg ordered 03/15/16 shows sinus rhythm, rate 60  Personal review of the device interrogation today. Results in [place]: 03/14/2016: ALT 19; B Natriuretic Peptide 21.6; BUN 14; Creatinine, Ser 1.40; Hemoglobin 11.5; Platelets 166; Potassium 4.0; Sodium 138; TSH 1.209    Lipid Panel     Component Value Date/Time   CHOL 140 10/24/2015 0437   TRIG 106 10/24/2015 0437   HDL 31 (L) 10/24/2015 0437   CHOLHDL 4.5 10/24/2015 0437   VLDL 21 10/24/2015 0437   LDLCALC 88 10/24/2015 0437     Wt Readings from Last 3 Encounters:  07/26/16 239 lb 9.6 oz (108.7 kg)  07/09/16 237 lb 9.6 oz (107.8 kg)  03/14/16 238 lb (108 kg)      Other studies Reviewed: Additional studies/ records that were reviewed today include:  TTE 09/26/15 demonstrates:  - Left ventricle: The cavity size was normal. Wall thickness was   increased in a pattern of mild LVH. Systolic function was normal.   The estimated ejection fraction was in the range of 55% to 60%.   Wall motion was normal; there were no regional wall motion   abnormalities. Doppler parameters are consistent with abnormal   left ventricular  relaxation (grade 1 diastolic dysfunction). - Aortic root: The aortic root was mildly dilated. - Mitral valve: Calcified annulus.  LHC 10/17/15, 10/22/15  Mid LAD lesion, 80 %stenosed, extending into the Ost 2nd Diag lesion, 90 %stenosed. The diagonal is larger than the continuation of the LAD.  Dist RCA lesion, 90 %stenosed.  RPDA lesion, 90 %stenosed. This appears to be instent restenosis. We do not have records of where his prior stents were placed.  THe RCA and PDA lesions are likely the culprit for his symptoms.  Ost Cx to Mid Cx lesion, 25 %stenosed.  The left ventricular systolic function is normal.  The left ventricular ejection fraction is 55-65% by visual estimate.  There is no aortic valve stenosis.   Dist RCA lesion, 90 %stenosed. RPDA lesion, 90 %stenosed. Rotational atherectomy was performed on both vessels with 1.5 and 1.75 burrs.  A STENT SYNERGY DES 2.75X28 drug eluting stent was successfully placed- covering both lesions, postdilated with a 3.5 balloon.  Post intervention, there is a 0% residual stenosis.  LV end diastolic pressure is normal.  There is no aortic valve stenosis.  ASSESSMENT AND PLAN:  1.  Syncope with intermittent heart block: Dual chamber pacemaker in place.  Had dropping R waves to 3.7 mV.  Recheck today shows R waves of 2.5-4 and threshold of 0.375.  Lead revision performed 04/10/15. Device functioning appropriately today. Did have some ventricular high rate episodes that appeared to be due to SVT and not atrial fibrillation. No changes at this time.  2. Fatigue: Unclear cause of fatigue. It is possibly related to his beta blocker. Dianca Owensby decrease Toprol-XL to 25 mg.  3. Coronary artery disease with table angina: s/p RCA stenting. On DAPT.   Current medicines are reviewed at length with the patient today.   The patient does not have concerns regarding his medicines.  The following changes were made today:  Decrease metoprolol  Labs/ tests  ordered today include:  No orders of the defined types were placed in this encounter.    Disposition:   FU with Zada Haser 12 months  Signed, Adorian Gwynne Meredith Leeds, MD  07/26/2016 2:06 PM     [facility]  [address] [place] [address] [phone] (office) [phone] (fax)

## 2016-07-26 NOTE — Patient Instructions (Signed)
Medication Instructions:    Your physician has recommended you make the following change in your medication:  1) DECREASE Toprol to 25 mg daily  --- If you need a refill on your cardiac medications before your next appointment, please call your pharmacy. ---  Labwork:  None ordered  Testing/Procedures:  None ordered  Follow-Up: Remote monitoring is used to monitor your Pacemaker of ICD from home. This monitoring reduces the number of office visits required to check your device to one time per year. It allows [place] to keep an eye on the functioning of your device to ensure it is working properly. You are scheduled for a device check from home on 10/25/2016. You may send your transmission at any time that day. If you have a wireless device, the transmission will be sent automatically. After your physician reviews your transmission, you will receive a postcard with your next transmission date.   Your physician wants you to follow-up in: 1 year with Dr. Curt Bears.  You will receive a reminder letter in the mail two months in advance. If you don't receive a letter, please call our office to schedule the follow-up appointment.  Thank you for choosing [facility]!!   Trinidad Curet, RN [phone]

## 2016-08-24 LAB — CUP PACEART INCLINIC DEVICE CHECK
Implantable Lead Implant Date: 20170221
Implantable Lead Location: 753859
MDC IDC LEAD IMPLANT DT: 20170221
MDC IDC LEAD LOCATION: 753860
MDC IDC PG IMPLANT DT: 20170221
MDC IDC SESS DTM: 20180814114136
Pulse Gen Model: 2240
Pulse Gen Serial Number: 7877392

## 2016-09-28 ENCOUNTER — Ambulatory Visit (INDEPENDENT_AMBULATORY_CARE_PROVIDER_SITE_OTHER): Payer: Medicare Other | Admitting: *Deleted

## 2016-09-28 DIAGNOSIS — R001 Bradycardia, unspecified: Secondary | ICD-10-CM

## 2016-09-28 NOTE — Progress Notes (Signed)
Remote pacemaker transmission.   

## 2016-09-30 ENCOUNTER — Encounter: Payer: Self-pay | Admitting: Cardiology

## 2016-09-30 LAB — CUP PACEART REMOTE DEVICE CHECK
Battery Remaining Percentage: 95.5 %
Battery Voltage: 3.01 V
Brady Statistic AP VP Percent: 1 %
Brady Statistic AP VS Percent: 40 %
Brady Statistic AS VS Percent: 59 %
Brady Statistic RA Percent Paced: 39 %
Brady Statistic RV Percent Paced: 1.2 %
Date Time Interrogation Session: 20180918060014
Implantable Lead Implant Date: 20170221
Implantable Lead Implant Date: 20170221
Implantable Pulse Generator Implant Date: 20170221
Lead Channel Impedance Value: 430 Ohm
Lead Channel Pacing Threshold Amplitude: 0.375 V
Lead Channel Pacing Threshold Pulse Width: 0.5 ms
Lead Channel Pacing Threshold Pulse Width: 0.5 ms
Lead Channel Sensing Intrinsic Amplitude: 4 mV
Lead Channel Setting Sensing Sensitivity: 0.7 mV
MDC IDC LEAD LOCATION: 753859
MDC IDC LEAD LOCATION: 753860
MDC IDC MSMT BATTERY REMAINING LONGEVITY: 127 mo
MDC IDC MSMT LEADCHNL RA IMPEDANCE VALUE: 450 Ohm
MDC IDC MSMT LEADCHNL RV PACING THRESHOLD AMPLITUDE: 1 V
MDC IDC MSMT LEADCHNL RV SENSING INTR AMPL: 12 mV
MDC IDC SET LEADCHNL RA PACING AMPLITUDE: 1.375
MDC IDC SET LEADCHNL RV PACING AMPLITUDE: 1.25 V
MDC IDC SET LEADCHNL RV PACING PULSEWIDTH: 0.5 ms
MDC IDC STAT BRADY AS VP PERCENT: 1 %
Pulse Gen Model: 2240
Pulse Gen Serial Number: 7877392

## 2016-10-06 ENCOUNTER — Telehealth: Payer: Self-pay | Admitting: Cardiology

## 2016-10-06 NOTE — Telephone Encounter (Signed)
New Message:    Please call,she needs to give you an update lon pt's condition,

## 2016-10-06 NOTE — Telephone Encounter (Signed)
Returned call to [facility] from [facility]. She states that she recently started seeing this patient. She states that he has gained 9 lbs over the last month. She states that he does have some lower extremity edema and SOB with exertion. She states that she thinks his salt intake has been minimal. Patient takes torsemide 20 mg QD.

## 2016-10-06 NOTE — Telephone Encounter (Signed)
OK to increase torsemide to 40 mg daily for 3 days and then back to 20 mg daily. He should eat potassium rich foods at that time as well.

## 2016-10-07 NOTE — Telephone Encounter (Signed)
Hope, RN, from [facility] returned my call.  I gave her Dr. Morton Amy recommendations and she went to tell me that since she had called here yesterday, that she has since spoken with the wife, and now states that the pt is down 4 lbs. Hope is hesitant to have pt increase diuretics until she is able to go out to pts home tomorrow, Friday, 10/08/16. She advised that once she is there and does her assessment, she will call and let [place] or Dr. Irish Lack know what's going on and then get a plan from there.  I advised her I would pass the message along to Dr. Irish Lack and his RN, [place].

## 2016-10-07 NOTE — Telephone Encounter (Signed)
Placed call back to [facility], [facility], re: instructions from Dr. Irish Lack. Left a message for her to call back.

## 2016-10-07 NOTE — Telephone Encounter (Signed)
Hold off on increased diuretic.  COntinue regular dose.

## 2016-10-07 NOTE — Telephone Encounter (Signed)
Left detailed message on Hope's VM stating that Dr. Irish Lack agrees with holding off on increasing the diuretic. Advised for the patient to continue the regular dose. Instructed for Hope to call back after she assesses the patient tomorrow if she feels like further intervention is required.

## 2016-10-08 ENCOUNTER — Telehealth: Payer: Self-pay | Admitting: Interventional Cardiology

## 2016-10-08 NOTE — Telephone Encounter (Signed)
New message     Pt c/o swelling: STAT is pt has developed SOB within 24 hours  1) How much weight have you gained and in what time span?   Almost 7lbs in a 24hr period  2) If swelling, where is the swelling located?   Abdominal extended  3) Are you currently taking a fluid pill?  Yes--torsemide 20mg   4) Are you currently SOB?  Yes---  5) Do you have a log of your daily weights (if so, list)?  no  6) Have you gained 3 pounds in a day or 5 pounds in a week?  yes  Have you traveled recently?  no

## 2016-10-08 NOTE — Telephone Encounter (Signed)
Spoke with [place] who reports yesterday afternoon the pt's wt (per report of wife) had decreased to 231.4 lbs. The decision was made to hold the order to increase pt's Torsemide to 40 mg X 3 days until [facility] saw him today.  This afternoon his weight is 238.8 lbs.  Advised to follow prior order to increase Torsemide 40 mg X 3 days as per Dr Irish Lack.  Hope is in agreement and will make pt aware.  She will have pt to continue to do daily wts and they will call her if concerns over the weekend.

## 2016-10-12 ENCOUNTER — Emergency Department (HOSPITAL_COMMUNITY): Payer: Medicare Other

## 2016-10-12 ENCOUNTER — Encounter (HOSPITAL_COMMUNITY): Payer: Self-pay | Admitting: Emergency Medicine

## 2016-10-12 ENCOUNTER — Observation Stay (HOSPITAL_COMMUNITY)
Admission: EM | Admit: 2016-10-12 | Discharge: 2016-10-13 | Disposition: A | Discharge status: Home / Self Care | Payer: Medicare Other | Attending: Internal Medicine | Admitting: Internal Medicine

## 2016-10-12 DIAGNOSIS — Z79899 Other long term (current) drug therapy: Secondary | ICD-10-CM | POA: Diagnosis not present

## 2016-10-12 DIAGNOSIS — E7800 Pure hypercholesterolemia, unspecified: Secondary | ICD-10-CM | POA: Insufficient documentation

## 2016-10-12 DIAGNOSIS — I2510 Atherosclerotic heart disease of native coronary artery without angina pectoris: Secondary | ICD-10-CM | POA: Diagnosis not present

## 2016-10-12 DIAGNOSIS — Z955 Presence of coronary angioplasty implant and graft: Secondary | ICD-10-CM | POA: Insufficient documentation

## 2016-10-12 DIAGNOSIS — Z791 Long term (current) use of non-steroidal anti-inflammatories (NSAID): Secondary | ICD-10-CM | POA: Diagnosis not present

## 2016-10-12 DIAGNOSIS — Z8673 Personal history of transient ischemic attack (TIA), and cerebral infarction without residual deficits: Secondary | ICD-10-CM | POA: Insufficient documentation

## 2016-10-12 DIAGNOSIS — R0602 Shortness of breath: Secondary | ICD-10-CM

## 2016-10-12 DIAGNOSIS — I252 Old myocardial infarction: Secondary | ICD-10-CM | POA: Diagnosis not present

## 2016-10-12 DIAGNOSIS — R0609 Other forms of dyspnea: Secondary | ICD-10-CM

## 2016-10-12 DIAGNOSIS — Z950 Presence of cardiac pacemaker: Secondary | ICD-10-CM | POA: Diagnosis not present

## 2016-10-12 DIAGNOSIS — N183 Chronic kidney disease, stage 3 (moderate): Secondary | ICD-10-CM | POA: Insufficient documentation

## 2016-10-12 DIAGNOSIS — E039 Hypothyroidism, unspecified: Secondary | ICD-10-CM | POA: Diagnosis not present

## 2016-10-12 DIAGNOSIS — Z87891 Personal history of nicotine dependence: Secondary | ICD-10-CM | POA: Diagnosis not present

## 2016-10-12 DIAGNOSIS — Z7982 Long term (current) use of aspirin: Secondary | ICD-10-CM | POA: Diagnosis not present

## 2016-10-12 DIAGNOSIS — I130 Hypertensive heart and chronic kidney disease with heart failure and stage 1 through stage 4 chronic kidney disease, or unspecified chronic kidney disease: Principal | ICD-10-CM | POA: Insufficient documentation

## 2016-10-12 DIAGNOSIS — F419 Anxiety disorder, unspecified: Secondary | ICD-10-CM | POA: Insufficient documentation

## 2016-10-12 DIAGNOSIS — F0280 Dementia in other diseases classified elsewhere without behavioral disturbance: Secondary | ICD-10-CM | POA: Diagnosis present

## 2016-10-12 DIAGNOSIS — G4733 Obstructive sleep apnea (adult) (pediatric): Secondary | ICD-10-CM | POA: Insufficient documentation

## 2016-10-12 DIAGNOSIS — I5030 Unspecified diastolic (congestive) heart failure: Secondary | ICD-10-CM | POA: Diagnosis present

## 2016-10-12 DIAGNOSIS — G300 Alzheimer's disease with early onset: Secondary | ICD-10-CM

## 2016-10-12 DIAGNOSIS — Z888 Allergy status to other drugs, medicaments and biological substances status: Secondary | ICD-10-CM | POA: Diagnosis not present

## 2016-10-12 LAB — BASIC METABOLIC PANEL
ANION GAP: 11 (ref 5–15)
BUN: 22 mg/dL — ABNORMAL HIGH (ref 6–20)
CHLORIDE: 99 mmol/L — AB (ref 101–111)
CO2: 24 mmol/L (ref 22–32)
CREATININE: 1.67 mg/dL — AB (ref 0.61–1.24)
Calcium: 9.4 mg/dL (ref 8.9–10.3)
GFR calc non Af Amer: 38 mL/min — ABNORMAL LOW (ref >60)
GFR, EST AFRICAN AMERICAN: 44 mL/min — AB (ref >60)
Glucose, Bld: 197 mg/dL — ABNORMAL HIGH (ref 65–99)
Potassium: 3.6 mmol/L (ref 3.5–5.1)
Sodium: 134 mmol/L — ABNORMAL LOW (ref 135–145)

## 2016-10-12 LAB — CBC
HCT: 36 % — ABNORMAL LOW (ref 39.0–52.0)
HCT: 34.4 % — ABNORMAL LOW (ref 39.0–52.0)
HEMOGLOBIN: 11.6 g/dL — AB (ref 13.0–17.0)
Hemoglobin: 10.9 g/dL — ABNORMAL LOW (ref 13.0–17.0)
MCH: 27.3 pg (ref 26.0–34.0)
MCH: 26.9 pg (ref 26.0–34.0)
MCHC: 32.2 g/dL (ref 30.0–36.0)
MCHC: 31.7 g/dL (ref 30.0–36.0)
MCV: 84.7 fL (ref 78.0–100.0)
MCV: 84.9 fL (ref 78.0–100.0)
PLATELETS: 197 10*3/uL (ref 150–400)
Platelets: 228 10*3/uL (ref 150–400)
RBC: 4.25 MIL/uL (ref 4.22–5.81)
RBC: 4.05 MIL/uL — ABNORMAL LOW (ref 4.22–5.81)
RDW: 15.5 % (ref 11.5–15.5)
RDW: 15.4 % (ref 11.5–15.5)
WBC: 6.7 10*3/uL (ref 4.0–10.5)
WBC: 6 10*3/uL (ref 4.0–10.5)

## 2016-10-12 LAB — BRAIN NATRIURETIC PEPTIDE: B Natriuretic Peptide: 32.9 pg/mL (ref 0.0–100.0)

## 2016-10-12 LAB — I-STAT TROPONIN, ED: Troponin i, poc: 0.01 ng/mL (ref 0.00–0.08)

## 2016-10-12 LAB — CREATININE, SERUM
CREATININE: 1.51 mg/dL — AB (ref 0.61–1.24)
GFR calc Af Amer: 49 mL/min — ABNORMAL LOW (ref >60)
GFR calc non Af Amer: 42 mL/min — ABNORMAL LOW (ref >60)

## 2016-10-12 LAB — TROPONIN I: Troponin I: <0.03 ng/mL (ref <0.03)

## 2016-10-12 MED ORDER — ENOXAPARIN SODIUM 40 MG/0.4ML ~~LOC~~ SOLN
40.0000 mg | SUBCUTANEOUS | Status: DC
  Administered 2016-10-12: 40 mg via SUBCUTANEOUS
  Filled 2016-10-12: qty 0.4

## 2016-10-12 MED ORDER — NITROGLYCERIN 0.4 MG SL SUBL: 0.4000 mg | SUBLINGUAL_TABLET | Freq: Every day | SUBLINGUAL | Status: DC | PRN

## 2016-10-12 MED ORDER — ASPIRIN EC 81 MG PO TBEC
81.0000 mg | DELAYED_RELEASE_TABLET | Freq: Every day | ORAL | Status: DC
  Administered 2016-10-13: 81 mg via ORAL
  Filled 2016-10-12: qty 1

## 2016-10-12 MED ORDER — METOPROLOL SUCCINATE ER 25 MG PO TB24
25.0000 mg | ORAL_TABLET | Freq: Every day | ORAL | Status: DC
  Administered 2016-10-13: 25 mg via ORAL
  Filled 2016-10-12: qty 1

## 2016-10-12 MED ORDER — ESCITALOPRAM OXALATE 10 MG PO TABS
10.0000 mg | ORAL_TABLET | Freq: Every day | ORAL | Status: DC
  Administered 2016-10-12 – 2016-10-13 (×2): 10 mg via ORAL
  Filled 2016-10-12 (×2): qty 1

## 2016-10-12 MED ORDER — RAMIPRIL 2.5 MG PO CAPS
2.5000 mg | ORAL_CAPSULE | Freq: Every day | ORAL | Status: DC
  Administered 2016-10-12: 2.5 mg via ORAL
  Filled 2016-10-12 (×3): qty 1

## 2016-10-12 MED ORDER — CLOPIDOGREL BISULFATE 75 MG PO TABS
75.0000 mg | ORAL_TABLET | Freq: Every day | ORAL | Status: DC
  Administered 2016-10-12 – 2016-10-13 (×2): 75 mg via ORAL
  Filled 2016-10-12 (×2): qty 1

## 2016-10-12 MED ORDER — ISOSORBIDE MONONITRATE ER 30 MG PO TB24
30.0000 mg | ORAL_TABLET | Freq: Every day | ORAL | Status: DC
  Administered 2016-10-13: 30 mg via ORAL
  Filled 2016-10-12: qty 1

## 2016-10-12 MED ORDER — ALBUTEROL SULFATE (2.5 MG/3ML) 0.083% IN NEBU: 3.0000 mL | INHALATION_SOLUTION | RESPIRATORY_TRACT | Status: DC | PRN

## 2016-10-12 MED ORDER — LEVOTHYROXINE SODIUM 25 MCG PO TABS
25.0000 ug | ORAL_TABLET | Freq: Every day | ORAL | Status: DC
  Administered 2016-10-13: 25 ug via ORAL
  Filled 2016-10-12: qty 1

## 2016-10-12 NOTE — H&P (Signed)
Date: 10/12/2016               Patient Name:  [name] MRN: [id number]  DOB: 1940-04-26 Age / Sex: 76 y.o., male   PCP: Maris Berger, MD              Medical Service: Internal Medicine Teaching Service              Attending Physician: Dr. Lenice Pressman, MD    First Contact: Charlynn Grimes, MS4 Pager: [phone]  Second Contact: Dr. Heber  Beach  Pager: [phone]            After Hours (After 5p/  First Contact Pager: [phone]  weekends / holidays): Second Contact Pager: [phone]   Chief Complaint: SOB and edema  History of Present Illness: [name] is a 76 y.o. 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   Patient currently denies SOB and is comfortably breathing on room air. Wife reports increased SOB and generalized edema in the past 2 weeks. Patient was recently trialed on an increased dose of Torsemide 40 mg on 9/28 for 3 days. Wife states this seemed to help his swelling. Once the patient was returned to his regular Torsemide dose of 20 mg, his SOB got worse. Wife decided to bring the patient to the ED today to "get some answers." Wife states patient has intermittent SOB both on exertion and when laying down. Patient states he gets SOB when walking up stairs and when laying flat. Per wife, patient requires 3 pillows to sleep and has intermittent "gurgling" breath sounds. Patient denies lightheadedness, chest pain, leg swelling, or the sensation of fluid on his lungs. However, patient states his lungs feel "full." Wife reports recent diagnosis of COPD at outside hospital, but does not remember PFTs being done. Patient has not smoked since his 3s. Wife tried giving the patient her nebulizer treatments to see if it would help  his breathing, but states it did not help his SOB. Patient has history of OSA but does not use CPAP yet due to poor fitting masks. Patient denies nausea, vomiting, fevers, chills, abdominal pain, or headache.  Patient has extensive cardiac history with prior stenting performed >20 years ago.  Echo on 9/17 showed mild LVH, EF 55-60%, grade I DD, mildly dilated aortic root, and calcified MV annulus. Underwent left heart catheterization 10/17/15 secondary to angina which revealed multiple coronary artery lesions. Continued to have anginal pain on medical management with Imdur and Plavix, underwent rotational atherectomy 10/22/15 to distal RCA and RPDA lesions which was felt to be source of his angina. The following day had a CVA with CTA head neck showing atherosclerosis including bilateral siphon and left VA origin. 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 Interrogated pacemaker showed no AF.  On initial examination in the ED, patient showed labored respiration with O2 sat dropping to 87% on room air after ambulating down the hallway, for one episode. O2 has otherwise been in mid 90s with unlabored breathing while in bed. Mild tachypnea noted, otherwise stable.  CXR did not reveal any significant pulmonary edema. Troponin and BNP were negative. No leukocytosis. There was low concern for PE given no hypoxia or tachycardia. Patient was not diuresed in the ED in  setting of elevated creatinine.   Meds: Current Facility-Administered Medications  Medication Dose Route Frequency Provider Last Rate Last Dose  . albuterol (PROVENTIL) (2.5 MG/3ML) 0.083% nebulizer solution 3 mL  3 mL Inhalation Q4H PRN Garrus Gauthreaux Ratliff, DO      . [START ON 10/13/2016] aspirin EC tablet 81 mg  81 mg Oral Daily Glee Lashomb Ratliff, DO      . clopidogrel (PLAVIX) tablet 75 mg  75 mg Oral Daily Chaundra Abreu Ratliff, DO      . enoxaparin (LOVENOX) injection 40 mg  40 mg Subcutaneous Q24H Tiny Chaudhary Ratliff,  DO      . escitalopram (LEXAPRO) tablet 10 mg  10 mg Oral Daily Orma Cheetham Ratliff, DO      . [START ON 10/13/2016] isosorbide mononitrate (IMDUR) 24 hr tablet 30 mg  30 mg Oral Daily Karis Rilling Ratliff, DO      . [START ON 10/13/2016] levothyroxine (SYNTHROID, LEVOTHROID) tablet 25 mcg  25 mcg Oral QAC breakfast Aaryan Essman Ratliff, DO      . [START ON 10/13/2016] metoprolol succinate (TOPROL-XL) 24 hr tablet 25 mg  25 mg Oral Daily Lloyd Cullinan Ratliff, DO      . nitroGLYCERIN (NITROSTAT) SL tablet 0.4 mg  0.4 mg Sublingual Daily PRN Reanna Scoggin Ratliff, DO      . ramipril (ALTACE) capsule 2.5 mg  2.5 mg Oral QHS Janziel Hockett Ratliff, DO        Allergies: Allergies as of 10/12/2016 - Review Complete 10/12/2016  Allergen Reaction Noted  . Novocain [procaine] Other (See Comments) 03/04/2015  . Rosuvastatin calcium Other (See Comments) 03/14/2015  . Statins Other (See Comments) 03/04/2015   Past Medical History:  Diagnosis Date  . Anxiety   . Arthritis    "knees, back, shoulders" (10/22/2015)  . CAD (coronary artery disease)    a. remote stenting >20 years ago. b. PTCA unknown vessel ~2013. c. 10/2015: s/p rotational atherectomy/DES to distal RCA and RPDA c/b embolic CVA  . CHB (complete heart block) ([place]) 02/2015   [name] 03/03/2015  . Chronic diastolic CHF (congestive heart failure) ([place])   . Chronic shoulder pain   . [name] (chronic kidney disease), stage III ([place])   . Dilated aortic root ([place])    a. mild by echo 09/2015.  Marland Kitchen Heart attack [facility]) 'many years ago'  . High cholesterol   . Hypertension   . OSA (obstructive sleep apnea)    "suppose to have a mask; they haven't got one adjusted for him yet" (10/22/2015)  . Presence of permanent cardiac pacemaker   . Sinus arrest   . Skin cancer of face    "had it cut off"  . Statin intolerance   . Symptomatic bradycardia    a. syncope/sinus arrest and bradycardia s/p St. Jude PPM 02/2015 with lead revision  03/2015.   Past Surgical History:  Procedure Laterality Date  . CARDIAC CATHETERIZATION N/A 10/17/2015   Procedure: Left Heart Cath and Coronary Angiography;  Surgeon: Jettie Booze, MD;  Location: [place] [facility];  Service: Cardiovascular;  Laterality: N/A;  . CARDIAC CATHETERIZATION N/A 10/22/2015   Procedure: Coronary Stent Intervention Rotablater;  Surgeon: Jettie Booze, MD;  Location: [place] [facility];  Service: Cardiovascular;  Laterality: N/A;  . CARDIAC CATHETERIZATION N/A 10/22/2015   Procedure: Left Heart Cath and Coronary Angiography;  Surgeon: Jettie Booze, MD;  Location: [place] [facility];  Service: Cardiovascular;  Laterality: N/A;  . CATARACT EXTRACTION W/ INTRAOCULAR LENS  IMPLANT, BILATERAL Bilateral 2016  . CORONARY ANGIOPLASTY  2012  . CORONARY ANGIOPLASTY WITH STENT PLACEMENT  2001  . CORONARY ANGIOPLASTY WITH STENT PLACEMENT  10/22/2015   "took 2 out and put 1 longer one in"  . EP IMPLANTABLE DEVICE N/A 03/04/2015   Procedure: Pacemaker Implant;  Surgeon: Will Meredith Leeds, MD;  Location: [place] [facility];  Service: Cardiovascular;  Laterality: N/A;  . EP IMPLANTABLE DEVICE N/A 04/10/2015   Procedure: PPM Lead Revision/Repair;  Surgeon: Will Meredith Leeds, MD;  Location: [place] [facility];  Service: Cardiovascular;  Laterality: N/A;  . EYE SURGERY    . INSERT / REPLACE / REMOVE PACEMAKER    . RETINAL DETACHMENT SURGERY Bilateral    "several on each side"   Family History  Problem Relation Age of Onset  . Diabetes Sister   . Diabetes Brother    Social History   Social History  . Marital status: Married    Spouse name: N/A  . Number of children: N/A  . Years of education: N/A   Occupational History  . Not on file.   Social History Main Topics  . Smoking status: Former Smoker    Packs/day: 1.00    Years: 10.00    Types: Cigarettes  . Smokeless tobacco: Never Used     Comment: "quit smoking in my early 20's"  . Alcohol use  Yes     Comment: 10/22/2015 "might have 1 glass of wine/month, if that  . Drug use: No  . Sexual activity: Yes   Other Topics Concern  . Not on file   Social History Narrative  . No narrative on file    Review of Systems: Constitutional: No fever, chills ENT: No headache Respiratory: Intermittent SOB when walking or laying flat. No wheezing Cardiovascular: No chest pain.  Gastrointestinal: No abdominal pain. No nausea or vomiting.  Musculoskeletal: No weakness.  Neurological: Memory problems.   Physical Exam: Blood pressure (!) 150/99, pulse 75, temperature 97.8 F (36.6 C), temperature source Oral, resp. rate 18, height 6' (1.829 m), weight 231 lb 14.4 oz (105.2 kg), SpO2 97 %.  Physical Exam  Constitutional: He appears well-developed and well-nourished. No distress.  HENT:  Head: Normocephalic and atraumatic.  Mouth/Throat: No oropharyngeal exudate.  Eyes: Conjunctivae and EOM are normal. Right eye exhibits no discharge. Left eye exhibits no discharge. No scleral icterus.  Neck: Normal range of motion.  Cardiovascular: Normal rate, regular rhythm and intact distal pulses.   Pulmonary/Chest: Effort normal and breath sounds normal. No stridor. No respiratory distress. He has no wheezes. He has no rales. He exhibits no tenderness.  No productive cough.  Abdominal: Soft. Bowel sounds are normal. He exhibits no distension. There is no tenderness. There is no rebound and no guarding.  Musculoskeletal: Normal range of motion.  Trace pitting edema  Neurological: He is alert.  Skin: Skin is warm and dry. Capillary refill takes less than 2 seconds. No rash noted. He is not diaphoretic. No erythema. No pallor.  Psychiatric: He has a normal mood and affect. His behavior is normal.    Lab results: CBC Latest Ref Rng & Units 10/12/2016 10/12/2016 03/14/2016  WBC 4.0 - 10.5 K/uL 6.0 6.7 5.2  Hemoglobin 13.0 - 17.0 g/dL 10.9(L) 11.6(L) 11.5(L)  Hematocrit 39.0 - 52.0 % 34.4(L) 36.0(L)  33.7(L)  Platelets 150 - 400 K/uL 197 228 166   BMP Latest Ref Rng & Units 10/12/2016 10/12/2016 03/14/2016  Glucose 65 - 99 mg/dL - 197(H) 149(H)  BUN 6 - 20 mg/dL - 22(H) 14  Creatinine 0.61 - 1.24 mg/dL 1.51(H) 1.67(H) 1.40(H)  Sodium 135 - 145 mmol/L - 134(L) 138  Potassium 3.5 - 5.1 mmol/L - 3.6 4.0  Chloride 101 - 111 mmol/L - 99(L) 105  CO2 22 - 32 mmol/L - 24 28  Calcium 8.9 - 10.3 mg/dL - 9.4 9.4   Troponin (Point of Care Test)  Recent Labs  10/12/16 1210  TROPIPOC 0.01   BNP (last 3 results)  Recent Labs  03/13/16 1720 03/14/16 1315 10/12/16 1200  BNP 29.0 21.6 32.9    Imaging results:  Dg Chest Portable 1 View  Result Date: 10/12/2016 CLINICAL DATA:  Shortness of breath EXAM: PORTABLE CHEST 1 VIEW COMPARISON:  03/14/2016 FINDINGS: Left chest wall pacer device is noted with lead in the right atrial appendage and right ventricle. Decreased lung volumes. There is asymmetric elevation of the right hemidiaphragm. No pleural effusion or edema. IMPRESSION: 1. No acute cardiopulmonary abnormalities. Electronically Signed   By: Kerby Moors M.D.   On: 10/12/2016 12:51    Other results: EKG:  normal sinus rhythm, left axis deviation  Assessment & Plan by Problem: Active Problems:   Shortness of breath  SOB: It is unclear what is causing patient's worsening SOB over past 2 weeks.  May be due to dchf however, BNP 32 and does not appear volume overloaded on exam so not likely.  Patient has untreated OSA however bicarb normal so less likely shortness of breath from untreated OSA.  Patient may have COPD as patient's wife states that he was told this during a previous hospitalization.  No wheezing on exam so less likely COPD exacerbation. ACS unlikely, no chest pain, neg troponin.  PE unlikely, nontachycardic, not hypoxic. Pneumonia unlikely, afebrile, no leukocytosis, no recent fevers, no productive cough.  Oxygen saturations 87% when ambulating on room air.  He was ambulated on  room air at a different point while in the ED and dropped to 91%.  Otherwise all other O2 sats 95-99% on room air. Per patient's wife patient has being slowly declining in health for the past 2 years.  His shortness of breath could be deconditioning. -initial Trop neg, BNP 32, neg EKG -Trend troponin  -Euvolemic appearing with clear breath sounds. Holding IV diuresis.  -Consider echo to evaluate cardiac function. Last echo 9/17 -CBC, BMP in AM - PT/OT eval ordered  AKI creatinine 1.3-1.4 baseline, currently 1.67. May be 2/2 recent torsemide dosage change. - holding IV diuresis   Dementia: Patient has poor memory. Wife attributes memory change to past stroke. She is concerned for dementia.  - Recommend outpatient neurology   Hypothyroidism: -continue home synthroid  CAD: remote stenting >20 years ago, PTCA unknown vessel ~2013, s/p rotational atherectomy/DES to distal RCA and RPDA c/b embolic CVA [id number]. -Continue home ramipril, aspirin, plavix, imdur, toresemide - continuing metoprolol succinate as CHF exacerbation unlikely and patient is compliant with BB daily -has statin intolerance   Dispo: Predict discharge after 1-2 days pending clinical status.   This is a Careers information officer Note.  The care of the patient was discussed with Dr. Heber Renville and Dr. Rebeca Alert and the assessment and plan was formulated with their assistance.  Please see their note for official documentation of the patient encounter.   Signed: Valinda Party, DO 10/12/2016, 10:30 PM    I have seen and examined the patient, and reviewed the H&P by Charlynn Grimes, MS 4 and discussed the care of the patient with him  Signed:  Valinda Party, DO 10/12/2016, 10:43 PM

## 2016-10-12 NOTE — ED Notes (Signed)
pts wife reports they have been dealing with sob x3 weeks, reports they doubled pts fluid pill fri sat and sun and his weight seemed to go down, reports he had gained 9 pounds in one day. Pt denies wearing O2 at home. Pt reports belly feels distended.

## 2016-10-12 NOTE — ED Notes (Signed)
Pt ambulated in hall by this RN and EDT, O2 dropped to 91% on room air, respirations labored upon returning back to stretcher

## 2016-10-12 NOTE — Plan of Care (Signed)
Problem: Safety: Goal: Ability to remain free from injury will improve Outcome: Progressing Call bell within reach. Bed rails up x3 Independent No fall this shift

## 2016-10-12 NOTE — ED Triage Notes (Signed)
Pt reports SOB with generalized edema x 2 weeks.  Pt reports doubling up on "fluid pill" to help reduce edema for 3 days.  Resp labored in triage, denies CP.

## 2016-10-12 NOTE — ED Provider Notes (Signed)
[facility] DEPT Provider Note   CSN: [id number] Arrival date & time: 10/12/16  1151     History   Chief Complaint Chief Complaint  Patient presents with  . Shortness of Breath    HPI [name] is a 76 y.o. male.  HPI 76 year old Caucasian male past medical history significant for CAD status post PCI in 2013, CHF last ef 55-60% 9/17, [name], COPD, OSA, that presents to the emergency Department today with complaints of increased worsening of shortness of breath. The patient states the past 2 weeks he has had increased shortness of breath with exertion and at rest. Patient also reports worsening orthopnea, generalized edema. The patient denies any associated chest pain. Does report a nonproductive cough but denies any associated fevers. Patient did speak with his cardiologist concerning his symptoms last week who recommended patient doubling up on his toresmide for 3 days. Wife at bedside states that this helped the swelling however he continues to be severely short of breath on exertion. Wife at bedside states that she had been taken his oxygen at home and has been running in the low 90s upper 80s. She feels the patient needs oxygen at home. Patient denies tobacco use. Reports some lower extremity edema. Denies any history of PE/DVT, prolonged immobilization, recent hospitalizations/surgeries, history of cancer, unilateral leg swelling or calf tenderness. The patient also reports weight gain in the past several weeks.  Pt denies any fever, chill, ha, vision changes, lightheadedness, dizziness, congestion, neck pain, cp, cough, abd pain, n/v/d, urinary symptoms, change in bowel habits, melena, hematochezia, lower extremity paresthesias.  Past Medical History:  Diagnosis Date  . Anxiety   . Arthritis    "knees, back, shoulders" (10/22/2015)  . CAD (coronary artery disease)    a. remote stenting >20 years ago. b. PTCA unknown vessel ~2013. c. 10/2015: s/p rotational atherectomy/DES to  distal RCA and RPDA c/b embolic CVA  . CHB (complete heart block) ([place]) 02/2015   [name] 03/03/2015  . Chronic diastolic CHF (congestive heart failure) ([place])   . Chronic shoulder pain   . [name] (chronic kidney disease), stage III ([place])   . Dilated aortic root ([place])    a. mild by echo 09/2015.  Marland Kitchen Heart attack [facility]) 'many years ago'  . High cholesterol   . Hypertension   . OSA (obstructive sleep apnea)    "suppose to have a mask; they haven't got one adjusted for him yet" (10/22/2015)  . Presence of permanent cardiac pacemaker   . Sinus arrest   . Skin cancer of face    "had it cut off"  . Statin intolerance   . Symptomatic bradycardia    a. syncope/sinus arrest and bradycardia s/p St. Jude PPM 02/2015 with lead revision 03/2015.    Patient Active Problem List   Diagnosis Date Noted  . Anemia, macrocytic 12/24/2015  . Frail elderly 11/14/2015  . History of heart artery stent 11/14/2015  . [name] stage G3a/A1, GFR 45-59 and albumin creatinine ratio <30 mg/g ([place]) 11/05/2015  . Facial weakness   . S/P PTCA (percutaneous transluminal coronary angioplasty)   . Hyperlipidemia   . Essential hypertension   . S/P [facility] Feb 2017 10/23/2015  . CAD-S/P remote PCI 20 yrs ago, ? 4 years ago, and s/p RCA PCI/DES 10/22/15 10/23/2015  . Acute on chronic alteration in mental status post PCI 10/23/2015  . Cerebral thrombosis with cerebral infarction 10/23/2015  . DOE (dyspnea on exertion)   . Accelerating angina ([place])   . Hypothyroidism  07/09/2015  . Fatigue due to depression 05/09/2015  . Moderate single current episode of major depressive disorder ([place]) 05/09/2015  . 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 Syncope 04/10/2015  . Faintness   . Retinal detachment, tractional, right eye 03/15/2015  . Hepatic cyst 03/11/2015  . Pacemaker 03/11/2015  . Abnormal electrocardiogram 03/10/2015  . Contracture of ankle and foot joint 03/10/2015  . Drug therapy 03/10/2015  . Dysphagia  03/10/2015  . Edema 03/10/2015  . History of ST elevation myocardial infarction (STEMI) 03/10/2015  . Low back pain 03/10/2015  . MCI (mild cognitive impairment) 03/10/2015  . Mediastinal lymphadenopathy 03/10/2015  . Obesity 03/10/2015  . Osteoarthritis 03/10/2015  . 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 Personal history of noncompliance with medical treatment, presenting hazards to health 03/10/2015  . 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 Symptomatic bradycardia 03/04/2015  . History of complete heart block 03/03/2015  . Complete atrioventricular block ([place]) 03/03/2015  . Abnormal chest x-ray 02/19/2015  . Abnormal TSH 02/19/2015  . Anxiety about health 02/19/2015  . CAD in native artery 02/19/2015  . Diastolic CHF ([place]) 81/01/7508  . 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 LVH (left ventricular hypertrophy) 02/19/2015  . Medical non-compliance 02/19/2015  . OSA (obstructive sleep apnea) 02/19/2015  . Prediabetes 02/19/2015  . Rotator cuff tear, right 02/19/2015  . Abnormal computed tomography scan 02/10/2015  . Abnormal CT of the chest 02/10/2015  . Coronary artery disease involving native coronary artery of native heart 01/23/2015  . Non-Q wave myocardial infarction ([place]) 09/09/2011    Past Surgical History:  Procedure Laterality Date  . CARDIAC CATHETERIZATION N/A 10/17/2015   Procedure: Left Heart Cath and Coronary Angiography;  Surgeon: Jettie Booze, MD;  Location: [facility];  Service: Cardiovascular;  Laterality: N/A;  . CARDIAC CATHETERIZATION N/A 10/22/2015   Procedure: Coronary Stent Intervention Rotablater;  Surgeon: Jettie Booze, MD;  Location: [facility];  Service: Cardiovascular;  Laterality: N/A;  . CARDIAC CATHETERIZATION N/A 10/22/2015   Procedure: Left Heart Cath and Coronary Angiography;  Surgeon: Jettie Booze, MD;  Location: [facility];  Service: Cardiovascular;  Laterality: N/A;  . CATARACT  EXTRACTION W/ INTRAOCULAR LENS  IMPLANT, BILATERAL Bilateral 2016  . CORONARY ANGIOPLASTY  2012  . CORONARY ANGIOPLASTY WITH STENT PLACEMENT  2001  . CORONARY ANGIOPLASTY WITH STENT PLACEMENT  10/22/2015   "took 2 out and put 1 longer one in"  . EP IMPLANTABLE DEVICE N/A 03/04/2015   Procedure: Pacemaker Implant;  Surgeon: Will Meredith Leeds, MD;  Location: [facility];  Service: Cardiovascular;  Laterality: N/A;  . EP IMPLANTABLE DEVICE N/A 04/10/2015   Procedure: PPM Lead Revision/Repair;  Surgeon: Will Meredith Leeds, MD;  Location: [facility];  Service: Cardiovascular;  Laterality: N/A;  . EYE SURGERY    . INSERT / REPLACE / REMOVE PACEMAKER    . RETINAL DETACHMENT SURGERY Bilateral    "several on each side"       Home Medications    Prior to Admission medications   Medication Sig Start Date End Date Taking? Authorizing Provider  acetaminophen (TYLENOL) 500 MG tablet Take 500 mg by mouth every 6 (six) hours as needed for headache.    [provider]  aspirin EC 81 MG tablet Take 81 mg by mouth daily.    [provider]  clopidogrel (PLAVIX) 75 MG tablet Take 1 tablet (75 mg total) by mouth daily. 10/20/15   Jettie Booze  E, NP  Cyanocobalamin (VITAMIN B-12) 1000 MCG/15ML LIQD Take 1 mL by mouth daily.    [provider]  escitalopram (LEXAPRO) 10 MG tablet Take 10 mg by mouth daily.    [provider]  isosorbide mononitrate (IMDUR) 30 MG 24 hr tablet Take 1 tablet (30 mg total) by mouth daily. 10/17/15   Jettie Booze, MD  levothyroxine (SYNTHROID, LEVOTHROID) 25 MCG tablet Take 25 mcg by mouth daily before breakfast.    [provider]  metoprolol succinate (TOPROL-XL) 25 MG 24 hr tablet Take 1 tablet (25 mg total) by mouth daily. Take with or immediately following a meal. 07/26/16   Camnitz, Ocie Doyne, MD  Multiple Vitamin (MULTIVITAMIN WITH MINERALS) TABS tablet Take 1 tablet by mouth daily as needed (takes  occasionally).     [provider]  nitroGLYCERIN (NITROSTAT) 0.4 MG SL tablet Place 0.4 mg under the tongue daily as needed for chest pain. Chest pain    [provider]  Omega-3 1000 MG CAPS Take 1 g by mouth daily.     [provider]  ramipril (ALTACE) 2.5 MG capsule Take 2.5 mg by mouth at bedtime.     [provider]  torsemide (DEMADEX) 20 MG tablet Take 20 mg by mouth every morning. 03/03/16   [provider]    Family History Family History  Problem Relation Age of Onset  . Diabetes Sister   . Diabetes Brother     Social History Social History  Substance Use Topics  . Smoking status: Former Smoker    Packs/day: 1.00    Years: 10.00    Types: Cigarettes  . Smokeless tobacco: Never Used     Comment: "quit smoking in my early 20's"  . Alcohol use Yes     Comment: 10/22/2015 "might have 1 glass of wine/month, if that     Allergies   Novocain [procaine]; Rosuvastatin calcium; and Statins   Review of Systems Review of Systems  Constitutional: Negative for chills and fever.  HENT: Negative for congestion and sore throat.   Eyes: Negative for visual disturbance.  Respiratory: Positive for cough and shortness of breath. Negative for wheezing.   Cardiovascular: Positive for leg swelling (bilatearlly). Negative for chest pain and palpitations.  Gastrointestinal: Negative for abdominal pain, diarrhea, nausea and vomiting.  Genitourinary: Negative for dysuria, flank pain, frequency, hematuria and urgency.  Musculoskeletal: Negative for arthralgias and myalgias.  Skin: Negative for rash.  Neurological: Negative for dizziness, syncope, weakness, light-headedness, numbness and headaches.  Psychiatric/Behavioral: Negative for sleep disturbance. The patient is not nervous/anxious.      Physical Exam Updated Vital Signs BP 117/84   Pulse 82   Temp 97.6 F (36.4 C) (Oral)   Resp (!) 21   Ht 6' (1.829 m)   Wt 104.8 kg (231 lb)    SpO2 95%   BMI 31.33 kg/m   Physical Exam  Constitutional: He is oriented to person, place, and time. He appears well-developed and well-nourished.  Non-toxic appearance. No distress.  HENT:  Head: Normocephalic and atraumatic.  Mouth/Throat: Oropharynx is clear and moist.  Eyes: Pupils are equal, round, and reactive to light. Conjunctivae are normal. Right eye exhibits no discharge. Left eye exhibits no discharge.  Neck: Normal range of motion. Neck supple. No JVD present.  Cardiovascular: Normal rate, regular rhythm, normal heart sounds and intact distal pulses.  Exam reveals no gallop and no friction rub.   No murmur heard. Pulmonary/Chest: Effort normal. No accessory muscle usage.  Tachypnea noted. No respiratory distress. He has decreased breath sounds. He has no wheezes. He has no rhonchi. He has no rales. He exhibits no tenderness.  Mild increased work of breathing. Saturation the low 90s. Mild tachypnea noted.  Abdominal: Soft. Bowel sounds are normal. He exhibits no distension. There is no tenderness. There is no rebound and no guarding.  Musculoskeletal: Normal range of motion. He exhibits no tenderness.  Trace pitting edema noted to the lower extremities.  Lymphadenopathy:    He has no cervical adenopathy.  Neurological: He is alert and oriented to person, place, and time.  Skin: Skin is warm and dry. Capillary refill takes less than 2 seconds. No rash noted.  Psychiatric: His behavior is normal. Judgment and thought content normal.  Nursing note and vitals reviewed.    ED Treatments / Results  Labs (all labs ordered are listed, but only abnormal results are displayed) Labs Reviewed  BASIC METABOLIC PANEL - Abnormal; Notable for the following:       Result Value   Sodium 134 (*)    Chloride 99 (*)    Glucose, Bld 197 (*)    BUN 22 (*)    Creatinine, Ser 1.67 (*)    GFR calc non Af Amer 38 (*)    GFR calc Af Amer 44 (*)    All other components within normal limits    CBC - Abnormal; Notable for the following:    Hemoglobin 11.6 (*)    HCT 36.0 (*)    All other components within normal limits  BRAIN NATRIURETIC PEPTIDE  I-STAT TROPONIN, ED    EKG  EKG Interpretation None       Radiology Dg Chest Portable 1 View  Result Date: 10/12/2016 CLINICAL DATA:  Shortness of breath EXAM: PORTABLE CHEST 1 VIEW COMPARISON:  03/14/2016 FINDINGS: Left chest wall pacer device is noted with lead in the right atrial appendage and right ventricle. Decreased lung volumes. There is asymmetric elevation of the right hemidiaphragm. No pleural effusion or edema. IMPRESSION: 1. No acute cardiopulmonary abnormalities. Electronically Signed   By: Kerby Moors M.D.   On: 10/12/2016 12:51    Procedures Procedures (including critical care time)  Medications Ordered in ED Medications - No data to display   Initial Impression / Assessment and Plan / ED Course  I have reviewed the triage vital signs and the nursing notes.  Pertinent labs & imaging results that were available during my care of the patient were reviewed by me and considered in my medical decision making (see chart for details).    Patient resents to the ED with complaints of increased shortness breath for the past 2 weeks on exertion and at rest. Patient with history of CAD, COPD, CHF. States he's had a 9 pound weight gain in one day. Spoke with his cardiologist last week who recommended 3 days of [place] on his toresmide. States this helped a weight gain. However he still has some increased shortness of breath on exertion and at rest. Increased orthopnea.  Patient is afebrile. Mild tachypnea noted. Saturations in the low 90s.  On exam patient does have some mild bibasilar crackles. Mild lower extremity edema. On exertion the patient does have tachypnea and increased work of breathing.  Chest x-ray does not reveal any significant pulmonary edema. EKG shows no ischemic changes. Troponin is negative. No  leukocytosis. Creatinine elevated at 1.6 baseline appears to 1.3. This may be likely due to patient increasing his diuretic.  The patient's symptoms may  be due to possibly COPD exacerbation versus CHF exacerbation. Patient does not have any significant wheezing on exam however does have diminished breath sounds. Have considered PE however patient is low risk and with no hypoxia or tachycardia noted low suspicion at this time.  We'll consult internal medicine for admission.  Spoke with internal medicine teaching service for admission. They we place admission orders. The patient updated on plan of care and remains hemodynamically stable this time.  The patient did ambulate and he sat at 87%.  Patient seen and evaluated my attending Dr. Vanita Panda who is agreeable to above plan.   Final Clinical Impressions(s) / ED Diagnoses   Final diagnoses:  Dyspnea on exertion    New Prescriptions New Prescriptions   No medications on file     Aaron Edelman 10/12/16 1516    Carmin Muskrat, MD 10/13/16 [phone]

## 2016-10-13 ENCOUNTER — Observation Stay (HOSPITAL_BASED_OUTPATIENT_CLINIC_OR_DEPARTMENT_OTHER): Payer: Medicare Other

## 2016-10-13 ENCOUNTER — Observation Stay (HOSPITAL_COMMUNITY): Payer: Medicare Other

## 2016-10-13 DIAGNOSIS — I130 Hypertensive heart and chronic kidney disease with heart failure and stage 1 through stage 4 chronic kidney disease, or unspecified chronic kidney disease: Secondary | ICD-10-CM | POA: Diagnosis not present

## 2016-10-13 DIAGNOSIS — I5032 Chronic diastolic (congestive) heart failure: Secondary | ICD-10-CM | POA: Diagnosis not present

## 2016-10-13 DIAGNOSIS — R0600 Dyspnea, unspecified: Secondary | ICD-10-CM | POA: Diagnosis not present

## 2016-10-13 DIAGNOSIS — G3184 Mild cognitive impairment, so stated: Secondary | ICD-10-CM

## 2016-10-13 DIAGNOSIS — I5030 Unspecified diastolic (congestive) heart failure: Secondary | ICD-10-CM | POA: Diagnosis not present

## 2016-10-13 DIAGNOSIS — G4733 Obstructive sleep apnea (adult) (pediatric): Secondary | ICD-10-CM

## 2016-10-13 DIAGNOSIS — Z888 Allergy status to other drugs, medicaments and biological substances status: Secondary | ICD-10-CM

## 2016-10-13 DIAGNOSIS — N183 Chronic kidney disease, stage 3 (moderate): Secondary | ICD-10-CM | POA: Diagnosis not present

## 2016-10-13 LAB — ECHOCARDIOGRAM COMPLETE
E decel time: 201 msec
E/e' ratio: 6.53
FS: 21 % — AB (ref 28–44)
Height: 72 in
LA ID, A-P, ES: 45 mm
LA diam end sys: 45 mm
LA diam index: 1.98 cm/m2
LA vol A4C: 34.6 ml
LA vol index: 16.6 mL/m2
LA vol: 37.6 mL
LV E/e' medial: 6.53
LV E/e'average: 6.53
LV PW d: 9.43 mm — AB (ref 0.6–1.1)
LV e' LATERAL: 8.7 cm/s
LVOT SV: 82 mL
LVOT VTI: 23.6 cm
LVOT area: 3.46 cm2
LVOT diameter: 21 mm
LVOT peak grad rest: 6 mmHg
LVOT peak vel: 120 cm/s
Lateral S' vel: 9.46 cm/s
MV Dec: 201
MV pk A vel: 71.3 m/s
MV pk E vel: 56.8 m/s
RV sys press: 23 mmHg
Reg peak vel: 223 cm/s
TAPSE: 14.2 mm
TDI e' lateral: 8.7
TDI e' medial: 5.87
TR max vel: 223 cm/s
Weight: 3710.4 oz

## 2016-10-13 LAB — TROPONIN I
Troponin I: <0.03 ng/mL (ref <0.03)
Troponin I: <0.03 ng/mL (ref <0.03)

## 2016-10-13 MED ORDER — TORSEMIDE 20 MG PO TABS: 20.0000 mg | ORAL_TABLET | ORAL | Status: DC

## 2016-10-13 NOTE — Progress Notes (Signed)
  Echocardiogram 2D Echocardiogram has been performed.  [name] 10/13/2016, 3:50 PM

## 2016-10-13 NOTE — Progress Notes (Signed)
All discharge instructions reviewed in detail with pt and wife with understanding verbalized by both. Denies any questions or concerns prior to discharge. Discharged to home with wife in stable condition.

## 2016-10-13 NOTE — Evaluation (Signed)
Physical Therapy Evaluation Patient Details Name: Floyed Masoud MRN: [id number] DOB: 1940-03-16 Today's Date: 10/13/2016   History of Present Illness  Mr. Gurski is a 76 y.o. 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   Clinical Impression   Pt admitted with above diagnosis. Pt currently with functional limitations due to the deficits listed below (see PT Problem List). Overall, he is likely at baseline; noting some balance deficits that can be addressed with further PT; He is currently with [facility] services, and the plan is for him to return home with HHPT/RN follow up; Recommend Outpt PT for gait and balance after HHcourse;  Pt will benefit from skilled PT to increase their independence and safety with mobility to allow discharge to the venue listed below.       Follow Up Recommendations Outpatient PT;Other (comment) (Rec Outpt PT after HH course)    Equipment Recommendations  Other (comment) (Consider RW; this can be addressed by HHPT as well)    Recommendations for Other Services       Precautions / Restrictions        Mobility  Bed Mobility Overal bed mobility: Modified Independent                Transfers Overall transfer level: Modified independent Equipment used: Straight cane             General transfer comment: No difficulty  Ambulation/Gait Ambulation/Gait assistance: Min guard Ambulation Distance (Feet): 120 Feet Assistive device: Straight cane Gait Pattern/deviations: Step-through pattern     General Gait Details: Minguard/supervision for safety; good use of cane for extra point of stability; a few small losses of balance from which he recovered without physical assist  Stairs Stairs:  Yes Stairs assistance: Min guard Stair Management: One rail Right;With cane Number of Stairs: 4 General stair comments: Minguard for safety  Wheelchair Mobility    Modified Rankin (Stroke Patients Only)       Balance                                             Pertinent Vitals/Pain Pain Assessment: No/denies pain    Home Living Family/patient expects to be discharged to:: Private residence Living Arrangements: Spouse/significant other Available Help at Discharge: Family;Available 24 hours/day Type of Home: Mobile home Home Access: Ramped entrance     Home Layout: One level Home Equipment: Transport planner;Wheelchair - manual;Cane - single point      Prior Function Level of Independence: Needs assistance   Gait / Transfers Assistance Needed: walks with cane  ADL's / Homemaking Assistance Needed: performs self care independently, assist for IADL, pt doesn't drive        Hand Dominance   Dominant Hand: Right    Extremity/Trunk Assessment   Upper Extremity Assessment Upper Extremity Assessment: Defer to OT evaluation    Lower Extremity Assessment Lower Extremity Assessment: Overall WFL for tasks assessed       Communication   Communication: No difficulties  Cognition Arousal/Alertness: Awake/alert Behavior During Therapy: WFL for tasks assessed/performed Overall Cognitive Status: History of cognitive impairments - at baseline  General Comments: pt with impaired memroy      General Comments General comments (skin integrity, edema, etc.): Session conducted on Room Air; O2 sats 97% end of walk; HR 94    Exercises     Assessment/Plan    PT Assessment Patient needs continued PT services  PT Problem List Decreased activity tolerance;Decreased balance       PT Treatment Interventions DME instruction;Gait training;Stair training;Functional mobility training;Therapeutic  activities;Therapeutic exercise;Balance training;Neuromuscular re-education;Cognitive remediation;Patient/family education    PT Goals (Current goals can be found in the Care Plan section)  Acute Rehab PT Goals Patient Stated Goal: to go home PT Goal Formulation: With patient Time For Goal Achievement: 10/20/16 Potential to Achieve Goals: Good    Frequency Min 3X/week   Barriers to discharge        Co-evaluation               AM-PAC PT "6 Clicks" Daily Activity  Outcome Measure Difficulty turning over in bed (including adjusting bedclothes, sheets and blankets)?: None Difficulty moving from lying on back to sitting on the side of the bed? : None Difficulty sitting down on and standing up from a chair with arms (e.g., wheelchair, bedside commode, etc,.)?: None Help needed moving to and from a bed to chair (including a wheelchair)?: None Help needed walking in hospital room?: None Help needed climbing 3-5 steps with a railing? : A Little 6 Click Score: 23    End of Session Equipment Utilized During Treatment: Gait belt Activity Tolerance: Patient tolerated treatment well Patient left: in bed;with call bell/phone within reach;with family/visitor present Nurse Communication: Mobility status PT Visit Diagnosis: Unsteadiness on feet (R26.81)    Time: 1424-1440 PT Time Calculation (min) (ACUTE ONLY): 16 min   Charges:   PT Evaluation $PT Eval Low Complexity: 1 Low     PT G Codes:   PT G-Codes **NOT FOR INPATIENT CLASS** Functional Assessment Tool Used: Clinical judgement Functional Limitation: Mobility: Walking and moving around Mobility: Walking and Moving Around Current Status (O3500): At least 1 percent but less than 20 percent impaired, limited or restricted Mobility: Walking and Moving Around Goal Status [phone]): 0 percent impaired, limited or restricted    Roney Marion, PT  [place] Pager [phone] Office [place] 10/13/2016, 3:16 PM

## 2016-10-13 NOTE — Evaluation (Signed)
Occupational Therapy Evaluation and Discharge Patient Details Name: [name] MRN: [id number] DOB: [birth year] Today's Date: 10/13/2016    History of Present Illness [name] is a 76 y.o. 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    Clinical Impression   Pt walks with a cane and completes ADL independently at baseline. He relies on his wife for transportation, managing his meds, meal prep and housekeeping. Pt presents with poor memory and decreased vision, particularly in the L eye. He is functioning at his baseline. His wife is attentive, she did state she thinks the reason for his memory deficits is he is not getting enough oxygen to his brain. No further OT needs.     Follow Up Recommendations  No OT follow up;Supervision/Assistance - 24 hour    Equipment Recommendations  None recommended by OT    Recommendations for Other Services       Precautions / Restrictions        Mobility Bed Mobility Overal bed mobility: Modified Independent                Transfers Overall transfer level: Modified independent Equipment used: Straight cane                  Balance Overall balance assessment: Modified Independent                                         ADL either performed or assessed with clinical judgement   ADL Overall ADL's : At baseline                                             Vision Baseline Vision/History: Wears glasses Wears Glasses: Reading only Patient Visual Report: Blurring of vision (Leye) Additional Comments: hx of detached retina, 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     Perception     Praxis      Pertinent Vitals/Pain Pain Assessment: No/denies pain      Hand Dominance Right   Extremity/Trunk Assessment Upper Extremity Assessment Upper Extremity Assessment: Overall WFL for tasks assessed   Lower Extremity Assessment Lower Extremity Assessment: Defer to PT evaluation       Communication Communication Communication: No difficulties   Cognition Arousal/Alertness: Awake/alert Behavior During Therapy: WFL for tasks assessed/performed Overall Cognitive Status: No family/caregiver present to determine baseline cognitive functioning                                 General Comments: pt with impaired memroy   General Comments       Exercises     Shoulder Instructions      Home Living Family/patient expects to be discharged to:: Private residence Living Arrangements: Spouse/significant other Available Help at Discharge: Family;Available 24 hours/day Type of Home: Mobile home Home Access: Ramped entrance     Home Layout: One level     Bathroom Shower/Tub: Occupational psychologist: Standard     Home Equipment: Shower  seat;Electric scooter;Wheelchair - manual;Cane - single point          Prior Functioning/Environment Level of Independence: Needs assistance  Gait / Transfers Assistance Needed: walks with cane ADL's / Homemaking Assistance Needed: performs self care independently, assist for IADL, pt doesn't drive            OT Problem List: Decreased cognition      OT Treatment/Interventions:      OT Goals(Current goals can be found in the care plan section) Acute Rehab OT Goals Patient Stated Goal: to go home  OT Frequency:     Barriers to D/C:            Co-evaluation              AM-PAC PT "6 Clicks" Daily Activity     Outcome Measure Help from another person eating meals?: None Help from another person taking care of personal grooming?: None Help from another person toileting, which includes using toliet, bedpan, or urinal?: None Help from another person bathing  (including washing, rinsing, drying)?: None Help from another person to put on and taking off regular upper body clothing?: None Help from another person to put on and taking off regular lower body clothing?: None 6 Click Score: 24   End of Session Equipment Utilized During Treatment: Gait belt (cane)  Activity Tolerance: Patient tolerated treatment well Patient left: in bed;with family/visitor present  OT Visit Diagnosis: Other symptoms and signs involving cognitive function                Time: [id number] OT Time Calculation (min): 30 min Charges:  OT General Charges $OT Visit: 1 Visit OT Evaluation $OT Eval Moderate Complexity: 1 Mod OT Treatments $Self Care/Home Management : 8-22 mins G-Codes: OT G-codes **NOT FOR INPATIENT CLASS** Functional Assessment Tool Used: Clinical judgement Functional Limitation: Self care Self Care Current Status (O2947): At least 1 percent but less than 20 percent impaired, limited or restricted Self Care Goal Status (M5465): At least 1 percent but less than 20 percent impaired, limited or restricted Self Care Discharge Status [phone]): At least 1 percent but less than 20 percent impaired, limited or restricted   10/13/2016 [name], OTR/L Pager: [phone]  [name] 10/13/2016, 10:29 AM

## 2016-10-13 NOTE — Care Management Obs Status (Signed)
[place] [facility] NOTIFICATION   Patient Details  Name: [name] MRN: [id number] Date of Birth: [birth year]   Medicare Observation Status Notification Given:  Yes    Carles Collet, RN 10/13/2016, 11:14 AM

## 2016-10-13 NOTE — Care Management Note (Signed)
Case Management Note  Patient Details  Name: [name] MRN: [id number] Date of Birth: 1940-01-24  Subjective/Objective:                 Patient in obs for SOB, edema. Spoke to patient and wife in room. They state they are active w [facility] for RN, verified with liaison Sharmon Revere. They decline needs for DME at home. CM will place resumption order (no F2F needed) for [facility].    Action/Plan:  Anticipate DC to home w [place].  Expected Discharge Date:                  Expected Discharge Plan:  [place]  In-House Referral:     Discharge planning Services  CM Consult  Post Acute Care Choice:    Choice offered to:  Patient, Spouse  DME Arranged:    DME Agency:     HH Arranged:  RN, PT HH Agency:  [facility]  Status of Service:  In process, will continue to follow  If discussed at Long Length of Stay Meetings, dates discussed:    Additional Comments:  Carles Collet, RN 10/13/2016, 11:29 AM

## 2016-10-13 NOTE — Progress Notes (Signed)
SATURATION QUALIFICATIONS: (This note is used to comply with regulatory documentation for home oxygen)  Patient Saturations on Room Air at Rest = 94%  Patient Saturations on Room Air while Ambulating = 97%  

## 2016-10-13 NOTE — Progress Notes (Signed)
Subjective: [name] was sitting up in bed this morning and states he feels well today. Patient demonstrates difficulty with memory, but denies SOB, chest pain, abdominal pain, nausea, or vomiting. Patient's wife states he appears better today and has not been showing signs of distress this morning. Wife and patient are both happy with his improvement and are ready to go home today.   Objective: Vital signs in last 24 hours: Vitals:   10/12/16 2357 10/13/16 0438 10/13/16 0736 10/13/16 0740  BP: 127/86 (!) 157/95  132/89  Pulse:  96  80  Resp:  20  18  Temp:  98 F (36.7 C)  98.3 F (36.8 C)  TempSrc:  Oral  Oral  SpO2:  97% 96% 94%  Weight:      Height:       Weight change:   Intake/Output Summary (Last 24 hours) at 10/13/16 1321 Last data filed at 10/13/16 0439  Gross per 24 hour  Intake              462 ml  Output              275 ml  Net              187 ml   Physical Exam  Constitutional: He appears well-developed and well-nourished. No distress.  HENT:  Head: Normocephalic.  Eyes: Conjunctivae and EOM are normal. Left eye exhibits no discharge. No scleral icterus.  Neck: Normal range of motion. No JVD present.  Cardiovascular: Normal rate, regular rhythm, normal heart sounds and intact distal pulses.   Pulmonary/Chest: Effort normal and breath sounds normal. No stridor. No respiratory distress. He has no wheezes. He has no rales. He exhibits no tenderness.  Abdominal: Soft. Bowel sounds are normal. He exhibits no distension. There is no tenderness. There is no rebound and no guarding.  Musculoskeletal: Normal range of motion.  Mild 1+ LE edema  Neurological: He is alert.  Oriented to person, place with options (identified hospital, but not city). Not oriented to time.   Skin: Skin is warm and dry. Capillary refill takes less than 2 seconds. He is not diaphoretic. No erythema. No pallor.  Psychiatric: He has a normal mood and affect. His behavior is normal.   Lab  Results: Troponin (Point of Care Test)  Recent Labs  10/12/16 1210  TROPIPOC 0.01   Lab Results  Component Value Date   CREATININE 1.51 (H) 10/12/2016   CREATININE 1.67 (H) 10/12/2016   CREATININE 1.40 (H) 03/14/2016   CBC Latest Ref Rng & Units 10/12/2016 10/12/2016 03/14/2016  WBC 4.0 - 10.5 K/uL 6.0 6.7 5.2  Hemoglobin 13.0 - 17.0 g/dL 10.9(L) 11.6(L) 11.5(L)  Hematocrit 39.0 - 52.0 % 34.4(L) 36.0(L) 33.7(L)  Platelets 150 - 400 K/uL 197 228 166   BMP Latest Ref Rng & Units 10/12/2016 10/12/2016 03/14/2016  Glucose 65 - 99 mg/dL - 197(H) 149(H)  BUN 6 - 20 mg/dL - 22(H) 14  Creatinine 0.61 - 1.24 mg/dL 1.51(H) 1.67(H) 1.40(H)  Sodium 135 - 145 mmol/L - 134(L) 138  Potassium 3.5 - 5.1 mmol/L - 3.6 4.0  Chloride 101 - 111 mmol/L - 99(L) 105  CO2 22 - 32 mmol/L - 24 28  Calcium 8.9 - 10.3 mg/dL - 9.4 9.4   BNP (last 3 results)  Recent Labs  03/13/16 1720 03/14/16 1315 10/12/16 1200  BNP 29.0 21.6 32.9    Micro Results: No results found for this or any previous visit (from the past 240  hour(s)). Studies/Results: Dg Chest 2 View  Result Date: 10/13/2016 CLINICAL DATA:  Shortness of breath. EXAM: CHEST  2 VIEW COMPARISON:  10/12/2016 FINDINGS: Enlarged and unchanged cardiomediastinal silhouette. Low lung volumes with bibasilar subsegmental atelectasis. No consolidation or frank edema. Dual lead pacer. No pneumothorax. IMPRESSION: Slight worsening aeration.  Bibasilar subsegmental atelectasis. Electronically Signed   By: Staci Righter M.D.   On: 10/13/2016 07:47   Dg Chest Portable 1 View  Result Date: 10/12/2016 CLINICAL DATA:  Shortness of breath EXAM: PORTABLE CHEST 1 VIEW COMPARISON:  03/14/2016 FINDINGS: Left chest wall pacer device is noted with lead in the right atrial appendage and right ventricle. Decreased lung volumes. There is asymmetric elevation of the right hemidiaphragm. No pleural effusion or edema. IMPRESSION: 1. No acute cardiopulmonary abnormalities.  Electronically Signed   By: Kerby Moors M.D.   On: 10/12/2016 12:51   Medications: I have reviewed the patient's current medications. Scheduled Meds: . aspirin EC  81 mg Oral Daily  . clopidogrel  75 mg Oral Daily  . enoxaparin (LOVENOX) injection  40 mg Subcutaneous Q24H  . escitalopram  10 mg Oral Daily  . isosorbide mononitrate  30 mg Oral Daily  . levothyroxine  25 mcg Oral QAC breakfast  . metoprolol succinate  25 mg Oral Daily  . ramipril  2.5 mg Oral QHS   Continuous Infusions: PRN Meds:.albuterol, nitroGLYCERIN Assessment/Plan: Active Problems:   Shortness of breath  SOB: It is unclear exactly what is causing patient's worsening SOB over past 2 weeks, however it is currently improved.   Getting Echo to assess cardiac function and estimated pulmonary artery pressures.  Symptoms may be related to pulmonary hypertension. -Euvolemic appearing with clear breath sounds. No IV diuresis indicated at this time.  -Echo  -Evaluated by OT 10/3. No further OT needs.  - Evaluated by PT 10/3. Recommend home health PT  AKI creatinine 1.3-1.4 baseline, currently 1.67. May be 2/2 recent torsemide dosage change. - IV diuresis not currently indicated    Dementia: Patient has poor memory. Wife attributes memory change to past stroke. She is concerned for dementia. MoCA performed today with score of 15 which suggests moderate-severe cognitive impairment   - Recommend outpatient neurology referral for cognitive impairment evaluation given MoCA score 15.   Hypothyroidism: -continue home synthroid  CAD: remote stenting >20 years ago, PTCA unknown vessel ~2013, s/p rotational atherectomy/DES to distal RCA and RPDA c/b embolic CVA 6767. -Continue home ramipril, aspirin, plavix, imdur, toresemide -continuing metoprolol succinate as CHF exacerbation unlikely and patient is compliant with BB daily -has statin intolerance   Dispo: Predict discharge today pending echo  This is a Location manager Note.  The care of the patient was discussed with Dr. Rebeca Alert and Dr. Heber Haviland and the assessment and plan formulated with their assistance.  Please see their attached note for official documentation of the daily encounter.   LOS: 0 days   Maureen Ralphs, Medical Student 10/13/2016, 1:21 PM    I have seen and examined the patient, and reviewed the daily progress note by Charlynn Grimes MS 4 and discussed the care of the patient with them.     Signed:  Valinda Party, DO 10/13/2016, 6:19 PM

## 2016-10-13 NOTE — H&P (Signed)
  Date: 10/13/2016  Patient name: [name]  Medical record number: [id number]  Date of birth: October 11, 1940   I have seen and evaluated [name] and discussed their care with the Residency Team.   The history was obtained primarily from his wife, as both he and his wife admit he has some "memory problems"  Briefly, [name] is a 76 year old male with a history of CAD status post stenting, complete heart block with pacemaker, dilated aortic root, CVA, HFpEF, [name] stage III, OSA, and remote history of smoking who presents with 2-3 weeks of shortness of breath. He gets intermittent spells of feeling out of breath, sometimes with exertion and sometimes at rest. His wife made him come in because she is very concerned about his breathing and feels like she "wants answers". She believes some of the breathing difficulties seemed to correlate with days where his weight was up. As result, he was recently trialed on a higher dose of torsemide for 3 days, then returned to his prior dose. At some point during the past few weeks, she also tried giving him a dose of her nebulizer, which did not seem to help. At times, he seems to make funny noises during his difficulty breathing, but neither of them were able to replicate this for [place].  He denies any chest pain or pressure, cough, or fevers. He does seem tired a lot during the day and naps frequently. He is not able to wear CPAP for his OSA due to poor mask fitting.  PMHx, Fam Hx, and/or Soc Hx : See above for PMH, smoked as a young man but quit in his early 45s, sister and brother with diabetes  Vitals:   10/13/16 0736 10/13/16 0740  BP:  132/89  Pulse:  80  Resp:  18  Temp:  98.3 F (36.8 C)  SpO2: 96% 94%   Physical Exam: Gen: alert, pleasant, not in distress HEENT: no JVD, no lymphadenopathy CV: RRR, no m/r/g Pulm: CTAB, normal work of breathing Abd: soft, non-tender, not distended, normal bowel sounds Extr: warm, no edema Skin: no  rashes or lesions Neuro: alert, oriented only to person, speech normal  Assessment and Plan: I have seen and evaluated the patient as outlined above. I agree with the formulated Assessment and Plan as detailed in the residents' note, with the following changes:   1. SOB Unclear etiology, but life-threatening conditions such as PE, pneumonia, ACS, and arrhythmia are very unlikely given negative testing thus far that includes normal troponins, EKG, chest x-ray (except for elevated right hemidiaphragm that is old), and essentially normal vital signs except for brief desaturation in the ER with ambulation. Most likely related to HFpEF or possibly undiagnosed pulmonary hypertension due to untreated OSA. Will obtain echocardiogram to evaluate his cardiac function and look for elevated pulmonary pressures. Fortunately, he seems back to baseline and has been able to walk with PT without getting short of breath or desaturating. If his echocardiogram is normal, he should be able to go home and complete the rest of this workup as an outpatient.  Oda Kilts, MD 10/3/20184:22 PM

## 2016-10-13 NOTE — Discharge Summary (Signed)
Name: [name] MRN: [id number] DOB: 1940/01/25 76 y.o. PCP: Brandon Berger, MD  Date of Admission: 10/12/2016 12:18 PM Date of Discharge: 10/13/2016 Attending Physician: Brandon Pressman, MD, PhD Discharge Diagnosis: 1. Shortness of Breath 2. Moderate-severe cognitive impairment  Principal Problem:   Shortness of breath Active Problems:   Diastolic CHF (HCC)   MCI (mild cognitive impairment)   Discharge Medications: Allergies as of 10/13/2016      Reactions   Novocain [procaine] Other (See Comments)   Hallucinations   Rosuvastatin Calcium Other (See Comments)   Whole body ache/pain   Statins Other (See Comments)   Whole body ache/pain      Medication List    TAKE these medications   acetaminophen 500 MG tablet Commonly known as:  TYLENOL Take 500 mg by mouth every 6 (six) hours as needed for headache.   aspirin EC 81 MG tablet Take 81 mg by mouth daily.   clopidogrel 75 MG tablet Commonly known as:  PLAVIX Take 1 tablet (75 mg total) by mouth daily.   escitalopram 10 MG tablet Commonly known as:  LEXAPRO Take 10 mg by mouth daily.   ibuprofen 200 MG tablet Commonly known as:  ADVIL,MOTRIN Take 200 mg by mouth every 4 (four) hours as needed for moderate pain (HEADACHE).   isosorbide mononitrate 30 MG 24 hr tablet Commonly known as:  IMDUR Take 1 tablet (30 mg total) by mouth daily.   levothyroxine 25 MCG tablet Commonly known as:  SYNTHROID, LEVOTHROID Take 25 mcg by mouth daily before breakfast.   metoprolol succinate 25 MG 24 hr tablet Commonly known as:  TOPROL XL Take 1 tablet (25 mg total) by mouth daily. Take with or immediately following a meal.   multivitamin with minerals Tabs tablet Take 1 tablet by mouth daily as needed (takes occasionally).   nitroGLYCERIN 0.4 MG SL tablet Commonly known as:  NITROSTAT Place 0.4 mg under the tongue daily as needed for chest pain. Chest pain   Omega-3 1000 MG Caps Take 1 g by mouth daily.     PROAIR HFA 108 (90 Base) MCG/ACT inhaler Generic drug:  albuterol Inhale 1-2 puffs into the lungs every 4 (four) hours as needed for shortness of breath.   ramipril 2.5 MG capsule Commonly known as:  ALTACE Take 2.5 mg by mouth at bedtime.   torsemide 20 MG tablet Commonly known as:  DEMADEX Take 20 mg by mouth every morning.   Vitamin B-12 1000 MCG/15ML Liqd Take 1 mL by mouth daily.       Disposition and follow-up:   [name] was discharged from [facility] in Good condition.  At the hospital follow up visit please address:  1.  SOB: Please monitor for recurrent symptoms. SOB may be 2/2 underlying pulmonary HTN vs. CHF. Last EF 60-65% on echo 10/3. Will need formal evaluation for pulm HTN with right heart catheterization. Echo 10/3 showed PA peak pressure 23 which may be underestimated.    Cognitive Impairment: MoCA 15. Please evaluate for dementia  OSA: Please fit patient for CPAP mask  2.  Labs / imaging needed at time of follow-up: None  3.  Pending labs/ test needing follow-up: None  Follow-up Appointments: Follow-up Information    Brandon Berger, MD Follow up.   Specialty:  Family Medicine Contact information: [address] [place] [address] [phone]        [facility] Follow up.   Specialty:  Pulmonology Contact  information: [place] [phone]       [place] Follow up.   Contact information: [place], [place] Hospital Course by problem list: Principal Problem:   Shortness of breath Active Problems:   Diastolic CHF (HCC)   MCI (mild cognitive impairment)   1. SOB Patient presented to ED with complaint of SOB and generalized edema. On intial examination in the ED, patient showed labored respiration with one episode of O2 sat dropping to 87% on room air  after ambulating in the hallway, otherwise showing O2 sat 95-99%. Mild tachypnea was noted, otherwise stable. CXR did not reveal any significant pulmonary edema or focal opacities. Troponin was negative x 3 and BNP 32.9. No leukocytosis. Low concern for PE given no hypoxia or tachycardia. Patient was admitted to medicine teaching service for further evaluation and management of persistent dyspnea and diminished capacity for exertion. Initially concerned for compromised heart function given history of dCHF and CAD, however patient had normal BNP, EKG, troponin, CXR.   Physical exam was without evidence of volume overload. Pulmonary HTN was considered in setting of chronic OSA. During admission, patient remained afebrile, hemodynamically stable, euvolemic, and did not display additional episodes of dyspnea. He did not require IV diuresis. Repeat CXR in the morning 10/3 was unchanged from prior. Echo was ordered 10/3 to evaluate cardiac function and showed EF 60-65% with mildly dilated RV and mildly decreased systolic function, PA peak pressure 23 Hg. Given improved clinical appearance, hemodynamic stability, and non-emergent echo findings, patient was discharged 10/3 with plan for further outpatient evaluation of SOB with primary care follow up and information for pulmonology office given.  2. Cognitive impairment Patient has chronic memory problems since last CVA 10/17 per wife. Wife is concerned and would like memory loss to be evaluated as she believes he has dementia or ongoing strokes. Cognitive function did not alter from baseline throughout hospitalization and patient had a normal neuro exam. Was often only oriented to self with difficulty recalling location or date. Patient was evaluated via MoCA with score of 15, which is suggestive of moderate-severe cognitive impairment. Recommend patient seeing outpatient neurology for cognitive impairment evaluation. Wife was agreeable to plan at time of discharge.   Information was given.  3. OSA Patient has chronically untreated OSA per wife. Has undergone multiple sleep studies and is supposed to be on CPAP at night, however has not had a mask fitted per wife. During hospitalization, patient did not display signs of respiratory acidosis and maintained appropriate O2 saturation levels. However, untreated OSA may be contributing to SOB if patient has underlying pulmonary HTN. Plan was discussed with wife to have patient be seen by sleep specialist or PCP to have mask fitted for his CPAP machine. Wife was agreeable to plan at time of discharge.   Discharge Vitals:   BP 132/89 (BP Location: Right Arm)   Pulse 80   Temp 98.3 F (36.8 C) (Oral)   Resp 18   Ht 6' (1.829 m)   Wt 231 lb 14.4 oz (105.2 kg)   SpO2 94%   BMI 31.45 kg/m   Pertinent Labs, Studies, and Procedures:  CBC Latest Ref Rng & Units 10/12/2016 10/12/2016 03/14/2016  WBC 4.0 - 10.5 K/uL 6.0 6.7 5.2  Hemoglobin 13.0 - 17.0 g/dL 10.9(L) 11.6(L) 11.5(L)  Hematocrit 39.0 - 52.0 % 34.4(L) 36.0(L) 33.7(L)  Platelets 150 -  400 K/uL 197 228 166   BMP Latest Ref Rng & Units 10/12/2016 10/12/2016 03/14/2016  Glucose 65 - 99 mg/dL - 197(H) 149(H)  BUN 6 - 20 mg/dL - 22(H) 14  Creatinine 0.61 - 1.24 mg/dL 1.51(H) 1.67(H) 1.40(H)  Sodium 135 - 145 mmol/L - 134(L) 138  Potassium 3.5 - 5.1 mmol/L - 3.6 4.0  Chloride 101 - 111 mmol/L - 99(L) 105  CO2 22 - 32 mmol/L - 24 28  Calcium 8.9 - 10.3 mg/dL - 9.4 9.4   Transthoracic Echocardiography    Study Date: 10/13/2016 - Pulmonary arteries: PA peak pressure: 23 mm Hg (S). - Normal LV size with moderate LV hypertrophy. EF 60-65%. Mildly   dilated RV with mildly decreased systolic function. No   significant valvular abnormalities.  DG Chest 2 view      Study Date: 10/13/2016   - COMPARISON:  10/12/2016 FINDINGS:  Enlarged and unchanged cardiomediastinal silhouette. Low lung  volumes with bibasilar subsegmental atelectasis. No consolidation or  frank  edema. Dual lead pacer. No pneumothorax. IMPRESSION:  Slight worsening aeration.  Bibasilar subsegmental atelectasis.  EKG: 10/13/2016 NS rhythm, left axis deviation  Discharge Instructions: Discharge Instructions    Diet - low sodium heart healthy    Complete by:  As directed    Discharge instructions    Complete by:  As directed    Please contact [facility]  Address: [address] [place], [place], [place] [address] Phone: [phone]   Increase activity slowly    Complete by:  As directed      Please contact: [facility] Follow up.   Contact information: [place], [place] [phone]   Signed: Valinda Party, DO 10/16/2016, 12:29 PM   Pager: [phone]

## 2016-10-15 DIAGNOSIS — I272 Pulmonary hypertension, unspecified: Secondary | ICD-10-CM | POA: Insufficient documentation

## 2016-10-19 NOTE — Telephone Encounter (Signed)
[facility] RN calling and states that he patient continues to have SOB. She states that it is with activity and at rest. She states that his weight is down at 229.8 lbs. She states that he is tachycardiac at 104. She states that his pulse is regular and that he is not having palpitations or fluttering in his chest. She states that the patient is taking torsemide 20 mg QD. Patient was just seen in the hospital and D/C'd on 10/3 and he did not have any evidence of volume overload. The patient had normal BNP, EKG, troponin, and CXR. Patient was advised to follow up with pulmonology. The discharge note did mention that the patient may need formal evaluation for pHTN with [facility]. He was also advised to wear his CPAP at home and [facility] nurse states that he has been doing so. Patient already has appointment scheduled with Dr. Irish Lack on 10/11 and with pulmonology on 10/15. Advised for patient to keep these appointments and let us know if his symptoms worsen before then.

## 2016-10-19 NOTE — Telephone Encounter (Signed)
Left message for [facility] to call back.  

## 2016-10-19 NOTE — Telephone Encounter (Signed)
New message     Pt c/o Shortness Of Breath: STAT if SOB developed within the last 24 hours or pt is noticeably SOB on the phone  1. Are you currently SOB (can you hear that pt is SOB on the phone)? Nurse on phone  2. How long have you been experiencing SOB?  Weeks   3. Are you SOB when sitting or when up moving around? both  4. Are you currently experiencing any other symptoms? 229.8  Is current weight , still having persistent sob with very little activity , now patient is having tachycardia events

## 2016-10-20 NOTE — Progress Notes (Addendum)
Cardiology Office Note   Date:  10/21/2016   ID:  [name], DOB 1940/03/01, MRN [id number]  PCP:  Maris Berger, MD    Chief Complaint  Patient presents with  . Shortness of Breath  . Tachycardia   CAD  Wt Readings from Last 3 Encounters:  10/21/16 231 lb 9.6 oz (105.1 kg)  10/12/16 231 lb 14.4 oz (105.2 kg)  07/26/16 239 lb 9.6 oz (108.7 kg)       History of Present Illness: [name] is a 76 y.o. male  with history of CAD (remote stenting >20 years ago, PTCA unknown vessel ~2013, s/p rotational atherectomy/DES to distal RCA and RPDA c/b embolic CVA), OSA, syncope with bradycardia/sinus arrest 02/2015 s/p StJ PPM (lead revision 03/2015), obesity, chronic diastolic CHF, [name] stage III, HTN, statin intolerance, mildly dilated aortic root who presents for follow-up.  He had a post cath CVA in 2018.  He has had persistent shortness of breath.  He was admitted in 10/18 and had normal BNP, and normal EF on echo. Concern for memory loss as well.  He has tried CPAP in the past but could not tolerate the mask.  He has been nmanaged with a [facility] nurse recently, who has been weighing him and adjusting diuretics.     He remains short of breath intermittently. The wife states that he sleeps a lot.    A few days ago, the wife took him the fire dept when he was [facility].  Sx resolved with supplemental oxygen.   THe weights have een sporadic.  He has had weight gains overnight...during sleep of up to  6 lbs.    Past Medical History:  Diagnosis Date  . Anxiety   . Arthritis    "knees, back, shoulders" (10/22/2015)  . CAD (coronary artery disease)    a. remote stenting >20 years ago. b. PTCA unknown vessel ~2013. c. 10/2015: s/p rotational atherectomy/DES to distal RCA and RPDA c/b embolic CVA  . CHB (complete heart block) ([place]) 02/2015   [name] 03/03/2015  . Chronic diastolic CHF (congestive heart failure) ([place])   . Chronic shoulder pain   . [name] (chronic kidney disease),  stage III ([place])   . Dilated aortic root ([place])    a. mild by echo 09/2015.  Marland Kitchen Heart attack [facility]) 'many years ago'  . High cholesterol   . Hypertension   . OSA (obstructive sleep apnea)    "suppose to have a mask; they haven't got one adjusted for him yet" (10/22/2015)  . Presence of permanent cardiac pacemaker   . Sinus arrest   . Skin cancer of face    "had it cut off"  . Statin intolerance   . Symptomatic bradycardia    a. syncope/sinus arrest and bradycardia s/p St. Jude PPM 02/2015 with lead revision 03/2015.    Past Surgical History:  Procedure Laterality Date  . CARDIAC CATHETERIZATION N/A 10/17/2015   Procedure: Left Heart Cath and Coronary Angiography;  Surgeon: Jettie Booze, MD;  Location: [facility];  Service: Cardiovascular;  Laterality: N/A;  . CARDIAC CATHETERIZATION N/A 10/22/2015   Procedure: Coronary Stent Intervention Rotablater;  Surgeon: Jettie Booze, MD;  Location: [facility];  Service: Cardiovascular;  Laterality: N/A;  . CARDIAC CATHETERIZATION N/A 10/22/2015   Procedure: Left Heart Cath and Coronary Angiography;  Surgeon: Jettie Booze, MD;  Location: [facility];  Service: Cardiovascular;  Laterality: N/A;  . CATARACT EXTRACTION W/ INTRAOCULAR LENS  IMPLANT,  BILATERAL Bilateral 2016  . CORONARY ANGIOPLASTY  2012  . CORONARY ANGIOPLASTY WITH STENT PLACEMENT  2001  . CORONARY ANGIOPLASTY WITH STENT PLACEMENT  10/22/2015   "took 2 out and put 1 longer one in"  . EP IMPLANTABLE DEVICE N/A 03/04/2015   Procedure: Pacemaker Implant;  Surgeon: Will Meredith Leeds, MD;  Location: [facility];  Service: Cardiovascular;  Laterality: N/A;  . EP IMPLANTABLE DEVICE N/A 04/10/2015   Procedure: PPM Lead Revision/Repair;  Surgeon: Will Meredith Leeds, MD;  Location: [facility];  Service: Cardiovascular;  Laterality: N/A;  . EYE SURGERY    . INSERT / REPLACE / REMOVE PACEMAKER    . RETINAL DETACHMENT SURGERY Bilateral     "several on each side"     Current Outpatient Prescriptions  Medication Sig Dispense Refill  . acetaminophen (TYLENOL) 500 MG tablet Take 500 mg by mouth every 6 (six) hours as needed for headache.    . albuterol (PROAIR HFA) 108 (90 Base) MCG/ACT inhaler Inhale 1-2 puffs into the lungs every 4 (four) hours as needed for shortness of breath.    Marland Kitchen aspirin EC 81 MG tablet Take 81 mg by mouth daily.    . clopidogrel (PLAVIX) 75 MG tablet Take 1 tablet (75 mg total) by mouth daily. 30 tablet 12  . Cyanocobalamin (VITAMIN B-12) 1000 MCG/15ML LIQD Take 1 mL by mouth daily.    Marland Kitchen escitalopram (LEXAPRO) 10 MG tablet Take 10 mg by mouth daily.    Marland Kitchen ibuprofen (ADVIL,MOTRIN) 200 MG tablet Take 200 mg by mouth every 4 (four) hours as needed for moderate pain (HEADACHE).    . isosorbide mononitrate (IMDUR) 30 MG 24 hr tablet Take 1 tablet (30 mg total) by mouth daily. 30 tablet 11  . levothyroxine (SYNTHROID, LEVOTHROID) 25 MCG tablet Take 25 mcg by mouth daily before breakfast.    . metoprolol succinate (TOPROL-XL) 25 MG 24 hr tablet Take 1 tablet (25 mg total) by mouth daily. Take with or immediately following a meal. 90 tablet 3  . Multiple Vitamin (MULTIVITAMIN WITH MINERALS) TABS tablet Take 1 tablet by mouth daily as needed (takes occasionally).     . nitroGLYCERIN (NITROSTAT) 0.4 MG SL tablet Place 0.4 mg under the tongue daily as needed for chest pain. Chest pain    . Omega-3 1000 MG CAPS Take 1 g by mouth daily.     . ramipril (ALTACE) 2.5 MG capsule Take 2.5 mg by mouth at bedtime.     . torsemide (DEMADEX) 20 MG tablet Take 20 mg by mouth every morning.     No current facility-administered medications for this visit.     Allergies:   Novocain [procaine]; Rosuvastatin calcium; and Statins    Social History:  The patient  reports that he has quit smoking. His smoking use included Cigarettes. He has a 10.00 pack-year smoking history. He has never used smokeless tobacco. He reports that he  drinks alcohol. He reports that he does not use drugs.   Family History:  The patient's *family history includes Diabetes in his brother and sister.    ROS:  Please see the history of present illness.   Otherwise, review of systems are positive for DOE.   All other systems are reviewed and negative.    PHYSICAL EXAM: VS:  BP 124/76   Pulse 98   Ht 6' (1.829 m)   Wt 231 lb 9.6 oz (105.1 kg)   BMI 31.41 kg/m  , BMI Body mass index is  31.41 kg/m. GEN: He appears short of breath HEENT: normal  Neck: no JVD, carotid bruits, or masses Cardiac: RRR; no murmurs, rubs, or gallops,no edema  Respiratory:  clear to auscultation bilaterally, GI: soft, nontender, nondistended, + BS MS: no deformity or atrophy  Skin: warm and dry, no rash Neuro:  Strength and sensation are intact Psych: euthymic mood, anxious affect   EKG:   The ekg ordered  In early 10/18 demonstrates NSR, no ST changes   Recent Labs: 03/14/2016: ALT 19; TSH 1.209 10/12/2016: B Natriuretic Peptide 32.9; BUN 22; Creatinine, Ser 1.51; Hemoglobin 10.9; Platelets 197; Potassium 3.6; Sodium 134   Lipid Panel    Component Value Date/Time   CHOL 140 10/24/2015 0437   TRIG 106 10/24/2015 0437   HDL 31 (L) 10/24/2015 0437   CHOLHDL 4.5 10/24/2015 0437   VLDL 21 10/24/2015 0437   LDLCALC 88 10/24/2015 0437     Other studies Reviewed: Additional studies/ records that were reviewed today with results demonstrating: CXR: atelectasis; no pulmonary edema.   ASSESSMENT AND PLAN:  1. CAD/SHOB: Negative cardiac w/u during [facility] hospital stay.  Certainly can't explain resting SHOB given normal ECG, cardiac enzymes and O2 sats.  He needs an ABG.  He reports some congestion, but most recent CXR was clear.  Perhaps an ABG would be helpful to see if he is truly hypoxemic.  Hopefully, that can be done in the pulmonary clinic at his appt in a few days.  Wife states that he improves immediately with supplemental oxygen so she hopes he  qualifies for this.  In our office, his SpO2 was 90% after walking and 98% at rest.  DOE likely multifactorial including OSA and deconditioning.  WOuld not pursue right heart cath given his age and memory issues. Echo estimate was normal.   2. Memory loss: Mentioned by the hospital team.  There may be a component of anxiety to his [facility].     3. Hyperlipidemia: statin intolerant.  WOuld not push this issue right now, until [facility] is better.  4. Diastolic heart failure:  Negative BNP, clear chest xray and no pitting edema on exam.  Continue current meds. I would be hesitant to increase diuretics at this time, as dehydration would worsen tachycardia.  I wonder if the weights are accurate since he reports gaining 6 lbs during a night of sleep.    Current medicines are reviewed at length with the patient today.  The patient concerns regarding his medicines were addressed.  The following changes have been made:  No change  Labs/ tests ordered today include:  No orders of the defined types were placed in this encounter.   Recommend 150 minutes/week of aerobic exercise Low fat, low carb, high fiber diet recommended  Disposition:   FU in 1 year   Signed, Larae Grooms, MD  10/21/2016 1:54 PM    [facility] [address] [place] [address], [place], [place]  [address] Phone: [phone]; Fax: [phone]

## 2016-10-21 ENCOUNTER — Ambulatory Visit (INDEPENDENT_AMBULATORY_CARE_PROVIDER_SITE_OTHER): Payer: Medicare Other | Admitting: Interventional Cardiology

## 2016-10-21 ENCOUNTER — Encounter: Payer: Self-pay | Admitting: Interventional Cardiology

## 2016-10-21 VITALS — BP 124/76 | HR 98 | Ht 72.0 in | Wt 231.6 lb

## 2016-10-21 DIAGNOSIS — E785 Hyperlipidemia, unspecified: Secondary | ICD-10-CM | POA: Diagnosis not present

## 2016-10-21 DIAGNOSIS — I25118 Atherosclerotic heart disease of native coronary artery with other forms of angina pectoris: Secondary | ICD-10-CM | POA: Diagnosis not present

## 2016-10-21 DIAGNOSIS — I5032 Chronic diastolic (congestive) heart failure: Secondary | ICD-10-CM

## 2016-10-21 DIAGNOSIS — G4733 Obstructive sleep apnea (adult) (pediatric): Secondary | ICD-10-CM | POA: Diagnosis not present

## 2016-10-21 NOTE — Patient Instructions (Addendum)
Medication Instructions:  Your physician recommends that you continue on your current medications as directed. Please refer to the Current Medication list given to you today.   Labwork: None ordered  Testing/Procedures: None ordered  Follow-Up: Keep your follow up appointment with Pulmonology on 10/25/16 at 11:30 AM.  Any Other Special Instructions Will Be Listed Below (If Applicable).     If you need a refill on your cardiac medications before your next appointment, please call your pharmacy.

## 2016-10-22 ENCOUNTER — Emergency Department (HOSPITAL_COMMUNITY)
Admission: EM | Admit: 2016-10-22 | Discharge: 2016-10-22 | Disposition: A | Discharge status: Home / Self Care | Payer: Medicare Other | Attending: Emergency Medicine | Admitting: Emergency Medicine

## 2016-10-22 ENCOUNTER — Encounter (HOSPITAL_COMMUNITY): Payer: Self-pay | Admitting: Emergency Medicine

## 2016-10-22 ENCOUNTER — Emergency Department (HOSPITAL_COMMUNITY): Payer: Medicare Other

## 2016-10-22 DIAGNOSIS — R0602 Shortness of breath: Secondary | ICD-10-CM | POA: Diagnosis present

## 2016-10-22 DIAGNOSIS — J441 Chronic obstructive pulmonary disease with (acute) exacerbation: Secondary | ICD-10-CM

## 2016-10-22 DIAGNOSIS — I2510 Atherosclerotic heart disease of native coronary artery without angina pectoris: Secondary | ICD-10-CM | POA: Insufficient documentation

## 2016-10-22 DIAGNOSIS — Z7982 Long term (current) use of aspirin: Secondary | ICD-10-CM | POA: Insufficient documentation

## 2016-10-22 DIAGNOSIS — E039 Hypothyroidism, unspecified: Secondary | ICD-10-CM | POA: Insufficient documentation

## 2016-10-22 DIAGNOSIS — Z87891 Personal history of nicotine dependence: Secondary | ICD-10-CM | POA: Insufficient documentation

## 2016-10-22 DIAGNOSIS — Z79899 Other long term (current) drug therapy: Secondary | ICD-10-CM | POA: Insufficient documentation

## 2016-10-22 DIAGNOSIS — J449 Chronic obstructive pulmonary disease, unspecified: Secondary | ICD-10-CM | POA: Diagnosis not present

## 2016-10-22 DIAGNOSIS — N183 Chronic kidney disease, stage 3 (moderate): Secondary | ICD-10-CM | POA: Diagnosis not present

## 2016-10-22 DIAGNOSIS — I5031 Acute diastolic (congestive) heart failure: Secondary | ICD-10-CM | POA: Insufficient documentation

## 2016-10-22 DIAGNOSIS — I130 Hypertensive heart and chronic kidney disease with heart failure and stage 1 through stage 4 chronic kidney disease, or unspecified chronic kidney disease: Secondary | ICD-10-CM | POA: Insufficient documentation

## 2016-10-22 HISTORY — DX: Chronic obstructive pulmonary disease, unspecified: J44.9

## 2016-10-22 LAB — CBC WITH DIFFERENTIAL/PLATELET
BASOS ABS: 0 10*3/uL (ref 0.0–0.1)
Basophils Relative: 0 %
Eosinophils Absolute: 0.2 10*3/uL (ref 0.0–0.7)
Eosinophils Relative: 2 %
HEMATOCRIT: 33.2 % — AB (ref 39.0–52.0)
HEMOGLOBIN: 10.4 g/dL — AB (ref 13.0–17.0)
LYMPHS PCT: 25 %
Lymphs Abs: 1.8 10*3/uL (ref 0.7–4.0)
MCH: 26.5 pg (ref 26.0–34.0)
MCHC: 31.3 g/dL (ref 30.0–36.0)
MCV: 84.5 fL (ref 78.0–100.0)
MONO ABS: 0.3 10*3/uL (ref 0.1–1.0)
Monocytes Relative: 4 %
NEUTROS ABS: 5 10*3/uL (ref 1.7–7.7)
Neutrophils Relative %: 69 %
Platelets: 220 10*3/uL (ref 150–400)
RBC: 3.93 MIL/uL — AB (ref 4.22–5.81)
RDW: 15.6 % — AB (ref 11.5–15.5)
WBC: 7.4 10*3/uL (ref 4.0–10.5)

## 2016-10-22 LAB — COMPREHENSIVE METABOLIC PANEL
ALBUMIN: 3.6 g/dL (ref 3.5–5.0)
ALK PHOS: 71 U/L (ref 38–126)
ALT: 19 U/L (ref 17–63)
ANION GAP: 14 (ref 5–15)
AST: 30 U/L (ref 15–41)
BUN: 14 mg/dL (ref 6–20)
CALCIUM: 9 mg/dL (ref 8.9–10.3)
CO2: 22 mmol/L (ref 22–32)
Chloride: 102 mmol/L (ref 101–111)
Creatinine, Ser: 1.51 mg/dL — ABNORMAL HIGH (ref 0.61–1.24)
GFR calc Af Amer: 49 mL/min — ABNORMAL LOW (ref >60)
GFR calc non Af Amer: 42 mL/min — ABNORMAL LOW (ref >60)
GLUCOSE: 174 mg/dL — AB (ref 65–99)
Potassium: 3.2 mmol/L — ABNORMAL LOW (ref 3.5–5.1)
SODIUM: 138 mmol/L (ref 135–145)
Total Bilirubin: 0.7 mg/dL (ref 0.3–1.2)
Total Protein: 7.1 g/dL (ref 6.5–8.1)

## 2016-10-22 LAB — BRAIN NATRIURETIC PEPTIDE: B Natriuretic Peptide: 35.1 pg/mL (ref 0.0–100.0)

## 2016-10-22 MED ORDER — PREDNISONE 10 MG PO TABS: 40.0000 mg | ORAL_TABLET | Freq: Every day | ORAL | 0 refills | Status: DC

## 2016-10-22 NOTE — ED Provider Notes (Signed)
[name] Provider Note   CSN: [id number] Arrival date & time: 10/22/16  1726     History   Chief Complaint Chief Complaint  Patient presents with  . Shortness of Breath    HPI [name] is a 76 y.o. male.  Patient brought in by EMS. Patient with concern for shortness of breath. Patient has a history of COPD and has a history of congestive heart failure. Patient brought in by [facility] EMS patient was complaining of shortness of breath for the past 2 weeks. He is not on home oxygen. Has not had oxygen sats below 90% on a regular basis. EMS did note wheezing bilaterally they gave him albuterol Atrovent nebulizer and also gave 125 mg Solu-Medrol. Patient has follow-up scheduled with pulmonary medicine on Monday. EMS noted that his room air sats were 97%. Patient denies any chest pain. Patient's congestive heart failure history is consistent with chronic diastolic congestive heart failure.      Past Medical History:  Diagnosis Date  . Anxiety   . Arthritis    "knees, back, shoulders" (10/22/2015)  . CAD (coronary artery disease)    a. remote stenting >20 years ago. b. PTCA unknown vessel ~2013. c. 10/2015: s/p rotational atherectomy/DES to distal RCA and RPDA c/b embolic CVA  . CHB (complete heart block) ([place]) 02/2015   [name] 03/03/2015  . Chronic diastolic CHF (congestive heart failure) ([place])   . Chronic shoulder pain   . [name] (chronic kidney disease), stage III ([place])   . COPD (chronic obstructive pulmonary disease) ([place])   . Dilated aortic root ([place])    a. mild by echo 09/2015.  Marland Kitchen Heart attack [facility]) 'many years ago'  . High cholesterol   . Hypertension   . OSA (obstructive sleep apnea)    "suppose to have a mask; they haven't got one adjusted for him yet" (10/22/2015)  . Presence of permanent cardiac pacemaker   . Sinus arrest   . Skin cancer of face    "had it cut off"  . Statin intolerance   . Symptomatic bradycardia    a. syncope/sinus arrest  and bradycardia s/p St. Jude PPM 02/2015 with lead revision 03/2015.    Patient Active Problem List   Diagnosis Date Noted  . Shortness of breath 10/12/2016  . Anemia, macrocytic 12/24/2015  . Frail elderly 11/14/2015  . History of heart artery stent 11/14/2015  . [name] stage G3a/A1, GFR 45-59 and albumin creatinine ratio <30 mg/g ([place]) 11/05/2015  . Facial weakness   . S/P PTCA (percutaneous transluminal coronary angioplasty)   . Hyperlipidemia   . Essential hypertension   . S/P [facility] Feb 2017 10/23/2015  . CAD-S/P remote PCI 20 yrs ago, ? 4 years ago, and s/p RCA PCI/DES 10/22/15 10/23/2015  . Acute on chronic alteration in mental status post PCI 10/23/2015  . Cerebral thrombosis with cerebral infarction 10/23/2015  . DOE (dyspnea on exertion)   . Accelerating angina ([place])   . Hypothyroidism 07/09/2015  . Fatigue due to depression 05/09/2015  . Moderate single current episode of major depressive disorder ([place]) 05/09/2015  . 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 Syncope 04/10/2015  . Faintness   . Retinal detachment, tractional, right eye 03/15/2015  . Hepatic cyst 03/11/2015  . Pacemaker 03/11/2015  . Abnormal electrocardiogram 03/10/2015  . Contracture of ankle and foot joint 03/10/2015  . Drug therapy 03/10/2015  . Dysphagia 03/10/2015  . Edema 03/10/2015  . History of ST elevation myocardial infarction (STEMI)  03/10/2015  . Low back pain 03/10/2015  . Mild cognitive impairment 03/10/2015  . Mediastinal lymphadenopathy 03/10/2015  . Obesity 03/10/2015  . Osteoarthritis 03/10/2015  . 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 Personal history of noncompliance with medical treatment, presenting hazards to health 03/10/2015  . 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 Symptomatic bradycardia 03/04/2015  . History of complete heart block 03/03/2015  . Complete atrioventricular block ([place]) 03/03/2015  . Abnormal chest x-ray 02/19/2015  . Abnormal TSH  02/19/2015  . Anxiety about health 02/19/2015  . CAD in native artery 02/19/2015  . Diastolic CHF ([place]) 45/03/8880  . 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 LVH (left ventricular hypertrophy) 02/19/2015  . Medical non-compliance 02/19/2015  . OSA (obstructive sleep apnea) 02/19/2015  . Prediabetes 02/19/2015  . Rotator cuff tear, right 02/19/2015  . Abnormal computed tomography scan 02/10/2015  . Abnormal CT of the chest 02/10/2015  . Coronary artery disease involving native coronary artery of native heart 01/23/2015  . Non-Q wave myocardial infarction ([place]) 09/09/2011    Past Surgical History:  Procedure Laterality Date  . CARDIAC CATHETERIZATION N/A 10/17/2015   Procedure: Left Heart Cath and Coronary Angiography;  Surgeon: Jettie Booze, MD;  Location: [facility];  Service: Cardiovascular;  Laterality: N/A;  . CARDIAC CATHETERIZATION N/A 10/22/2015   Procedure: Coronary Stent Intervention Rotablater;  Surgeon: Jettie Booze, MD;  Location: [facility];  Service: Cardiovascular;  Laterality: N/A;  . CARDIAC CATHETERIZATION N/A 10/22/2015   Procedure: Left Heart Cath and Coronary Angiography;  Surgeon: Jettie Booze, MD;  Location: [facility];  Service: Cardiovascular;  Laterality: N/A;  . CATARACT EXTRACTION W/ INTRAOCULAR LENS  IMPLANT, BILATERAL Bilateral 2016  . CORONARY ANGIOPLASTY  2012  . CORONARY ANGIOPLASTY WITH STENT PLACEMENT  2001  . CORONARY ANGIOPLASTY WITH STENT PLACEMENT  10/22/2015   "took 2 out and put 1 longer one in"  . EP IMPLANTABLE DEVICE N/A 03/04/2015   Procedure: Pacemaker Implant;  Surgeon: Will Meredith Leeds, MD;  Location: [facility];  Service: Cardiovascular;  Laterality: N/A;  . EP IMPLANTABLE DEVICE N/A 04/10/2015   Procedure: PPM Lead Revision/Repair;  Surgeon: Will Meredith Leeds, MD;  Location: [facility];  Service: Cardiovascular;  Laterality: N/A;  . EYE SURGERY    . INSERT /  REPLACE / REMOVE PACEMAKER    . RETINAL DETACHMENT SURGERY Bilateral    "several on each side"       Home Medications    Prior to Admission medications   Medication Sig Start Date End Date Taking? Authorizing Provider  acetaminophen (TYLENOL) 500 MG tablet Take 500 mg by mouth every 6 (six) hours as needed for headache.   Yes [provider]  albuterol (PROAIR HFA) 108 (90 Base) MCG/ACT inhaler Inhale 1-2 puffs into the lungs every 4 (four) hours as needed for shortness of breath.   Yes [provider]  albuterol (PROVENTIL) (2.5 MG/3ML) 0.083% nebulizer solution Take 2.5 mg by nebulization every 6 (six) hours as needed for wheezing or shortness of breath.   Yes [provider]  aspirin EC 81 MG tablet Take 81 mg by mouth at bedtime.    Yes [provider]  clopidogrel (PLAVIX) 75 MG tablet Take 1 tablet (75 mg total) by mouth daily. Patient taking differently: Take 75 mg by mouth at bedtime.  10/20/15  Yes Arbutus Leas, NP  Cyanocobalamin (VITAMIN B-12 PO) Place 1 mL under the tongue daily.  Yes [provider]  escitalopram (LEXAPRO) 10 MG tablet Take 10 mg by mouth at bedtime.    Yes [provider]  isosorbide mononitrate (IMDUR) 30 MG 24 hr tablet Take 1 tablet (30 mg total) by mouth daily. 10/17/15  Yes Jettie Booze, MD  levothyroxine (SYNTHROID, LEVOTHROID) 25 MCG tablet Take 25 mcg by mouth daily before breakfast.   Yes [provider]  metoprolol succinate (TOPROL-XL) 25 MG 24 hr tablet Take 1 tablet (25 mg total) by mouth daily. Take with or immediately following a meal. 07/26/16  Yes Camnitz, Ocie Doyne, MD  Multiple Vitamin (MULTIVITAMIN WITH MINERALS) TABS tablet Take 1 tablet by mouth daily.    Yes [provider]  nitroGLYCERIN (NITROSTAT) 0.4 MG SL tablet Place 0.4 mg under the tongue daily as needed for chest pain.    Yes [provider]  ramipril (ALTACE) 2.5 MG capsule Take 2.5 mg by  mouth at bedtime.    Yes [provider]  torsemide (DEMADEX) 20 MG tablet Take 20 mg by mouth daily.  03/03/16  Yes [provider]  predniSONE (DELTASONE) 10 MG tablet Take 4 tablets (40 mg total) by mouth daily. 10/22/16   Fredia Sorrow, MD    Family History Family History  Problem Relation Age of Onset  . Diabetes Sister   . Diabetes Brother     Social History Social History  Substance Use Topics  . Smoking status: Former Smoker    Packs/day: 1.00    Years: 10.00    Types: Cigarettes  . Smokeless tobacco: Never Used     Comment: "quit smoking in my early 20's"  . Alcohol use Yes     Comment: 10/22/2015 "might have 1 glass of wine/month, if that     Allergies   Novocain [procaine]; Rosuvastatin calcium; and Statins   Review of Systems Review of Systems  Constitutional: Negative for fever.  HENT: Negative for congestion.   Eyes: Negative for redness.  Respiratory: Positive for shortness of breath and wheezing.   Cardiovascular: Negative for chest pain and leg swelling.  Gastrointestinal: Negative for abdominal pain.  Genitourinary: Negative for dysuria.  Musculoskeletal: Negative for back pain.  Skin: Negative for rash.  Neurological: Negative for headaches.  Hematological: Does not bruise/bleed easily.  Psychiatric/Behavioral: Negative for confusion.     Physical Exam Updated Vital Signs BP 110/75   Pulse 91   Temp 97.7 F (36.5 C) (Oral)   Resp (!) 22   Ht 1.829 m (6')   Wt 105 kg (231 lb 6.4 oz)   SpO2 96%   BMI 31.38 kg/m   Physical Exam  Constitutional: He is oriented to person, place, and time. He appears well-developed and well-nourished. No distress.  HENT:  Head: Normocephalic and atraumatic.  Mouth/Throat: Oropharynx is clear and moist.  Eyes: Pupils are equal, round, and reactive to light. Conjunctivae and EOM are normal.  Neck: Normal range of motion. Neck supple.  Cardiovascular: Normal rate, regular rhythm and  normal heart sounds.   Pulmonary/Chest: Effort normal and breath sounds normal. He has no wheezes.  Abdominal: Soft. Bowel sounds are normal. There is no tenderness.  Musculoskeletal: Normal range of motion. He exhibits no edema.  Neurological: He is alert and oriented to person, place, and time. No cranial nerve deficit or sensory deficit. He exhibits normal muscle tone. Coordination normal.  Skin: Skin is warm.  Nursing note and vitals reviewed.    ED Treatments / Results  Labs (all labs ordered are  listed, but only abnormal results are displayed) Labs Reviewed  COMPREHENSIVE METABOLIC PANEL - Abnormal; Notable for the following:       Result Value   Potassium 3.2 (*)    Glucose, Bld 174 (*)    Creatinine, Ser 1.51 (*)    GFR calc non Af Amer 42 (*)    GFR calc Af Amer 49 (*)    All other components within normal limits  CBC WITH DIFFERENTIAL/PLATELET - Abnormal; Notable for the following:    RBC 3.93 (*)    Hemoglobin 10.4 (*)    HCT 33.2 (*)    RDW 15.6 (*)    All other components within normal limits  BRAIN NATRIURETIC PEPTIDE    EKG  EKG Interpretation  Date/Time:  Friday October 22 2016 17:31:53 EDT Ventricular Rate:  100 PR Interval:    QRS Duration: 81 QT Interval:  382 QTC Calculation: 493 R Axis:   -59 Text Interpretation:  Sinus tachycardia LAD, consider LAFB or inferior infarct Abnormal R-wave progression, late transition Borderline prolonged QT interval No significant change since last tracing Confirmed by Fredia Sorrow [phone]) on 10/22/2016 5:36:25 PM       Radiology Dg Chest 2 View  Result Date: 10/22/2016 CLINICAL DATA:  Dyspnea for several days. EXAM: CHEST  2 VIEW COMPARISON:  10/13/2016 FINDINGS: Stable right hemidiaphragm elevation. Moderate cardiomegaly, unchanged. Grossly intact transvenous cardiac leads. No airspace consolidation. No effusions. Normal pulmonary vasculature. IMPRESSION: Stable cardiomegaly.  No consolidation or effusion.  Electronically Signed   By: Andreas Newport M.D.   On: 10/22/2016 19:33    Procedures Procedures (including critical care time)  Medications Ordered in ED Medications - No data to display   Initial Impression / Assessment and Plan / ED Course  I have reviewed the triage vital signs and the nursing notes.  Pertinent labs & imaging results that were available during my care of the patient were reviewed by me and considered in my medical decision making (see chart for details).    EMS documented bilateral. Patient without any wheezing here. Chest x-ray negative no evidence of pulmonary edema. Labs pre-much baseline for him. Patient was also given Solu-Medrol so we'll continue him on a prednisone course. He has albuterol inhalers available at home. He will continue to use them. His pulmonary follow-up on Monday. Patient's oxygen saturations here of all been in the upper 90s. Patient in no respiratory distress at all.   Final Clinical Impressions(s) / ED Diagnoses   Final diagnoses:  SOB (shortness of breath)  COPD exacerbation (HCC)    New Prescriptions New Prescriptions   PREDNISONE (DELTASONE) 10 MG TABLET    Take 4 tablets (40 mg total) by mouth daily.     Fredia Sorrow, MD 10/22/16 2236

## 2016-10-22 NOTE — ED Triage Notes (Signed)
Per [facility] ems patient complaining of SOB x 2 weeks. Per ems wheezing observed in both lungs. EMS administered albuterol and duo neb with minimal change in wheezing. Patient received 125 mg solumedrol. History of COPD and heart failure. Patient reports recently being admitted for "problems breathing". Patient NSR per ems. 97% on room air. Patient alert and oriented x3. Patient unaware of month.

## 2016-10-22 NOTE — Discharge Instructions (Signed)
Keep your follow-up appointment scheduled for Monday with pulmonary medicine. Continue usual albuterol inhaler. Take the prednisone course for the next 5 days. Return for any new or worse symptoms.

## 2016-10-25 ENCOUNTER — Encounter (INDEPENDENT_AMBULATORY_CARE_PROVIDER_SITE_OTHER): Payer: Medicare Other | Admitting: Acute Care

## 2016-10-25 ENCOUNTER — Encounter: Payer: Self-pay | Admitting: Acute Care

## 2016-10-25 ENCOUNTER — Ambulatory Visit (INDEPENDENT_AMBULATORY_CARE_PROVIDER_SITE_OTHER): Payer: Medicare Other | Admitting: Pulmonary Disease

## 2016-10-25 ENCOUNTER — Encounter: Payer: Self-pay | Admitting: Pulmonary Disease

## 2016-10-25 VITALS — BP 140/80 | HR 77 | Ht 72.0 in | Wt 232.2 lb

## 2016-10-25 DIAGNOSIS — J449 Chronic obstructive pulmonary disease, unspecified: Secondary | ICD-10-CM | POA: Insufficient documentation

## 2016-10-25 DIAGNOSIS — G4733 Obstructive sleep apnea (adult) (pediatric): Secondary | ICD-10-CM | POA: Diagnosis not present

## 2016-10-25 NOTE — Progress Notes (Signed)
Subjective:    Patient ID: [name], male    DOB: 10-26-1940, 76 y.o.   MRN: [id number]  HPI He was diagnosed with sleep apnea 4 years ago. Since then he has had trouble tolerating his mask. He does wake up at night intermittent "coughing & choking". His degree of dyspnea seems to be progressively worsening. Patient reportedly has these episodic periods of dyspnea which do not seem to be precipitated by anything in particular. He experienced some symptomatic improvement with oxygen therapy previously but has never had any noted or documented hypoxia per his wife. He has been hospitalized a couple of times and was ultimately diagnosed with COPD after a hospital stay in June. He has dyspnea at rest as well as with exertion. He does cough intermittently and it produces a small amount of mucus the last couple of weeks. He has had wheezing at times. He reports mild chest discomfort that is relieved with sublingual nitroglycerin. No other chest pressure or tightness. No reflux or dyspepsia. Does have chronic sinus congestion & drainage. He does have some improvement in his dyspnea and symptoms with using his nebulizer & rescue inhaler at home. He reports he hasn't been on any other inhalers.   Review of Systems No fever or chills. No rashes or bruising. A pertinent 14 point review of systems is negative except as per the history of presenting illness.  Allergies  Allergen Reactions  . Novocain [Procaine] Other (See Comments)    Hallucinations  . Rosuvastatin Calcium Other (See Comments)    Whole body ache/pain  . Statins Other (See Comments)    Whole body ache/pain    Current Outpatient Prescriptions on File Prior to Visit  Medication Sig Dispense Refill  . acetaminophen (TYLENOL) 500 MG tablet Take 500 mg by mouth every 6 (six) hours as needed for headache.    . albuterol (PROAIR HFA) 108 (90 Base) MCG/ACT inhaler Inhale 1-2 puffs into the lungs every 4 (four) hours as needed for shortness  of breath.    Marland Kitchen albuterol (PROVENTIL) (2.5 MG/3ML) 0.083% nebulizer solution Take 2.5 mg by nebulization every 6 (six) hours as needed for wheezing or shortness of breath.    Marland Kitchen aspirin EC 81 MG tablet Take 81 mg by mouth at bedtime.     . clopidogrel (PLAVIX) 75 MG tablet Take 1 tablet (75 mg total) by mouth daily. (Patient taking differently: Take 75 mg by mouth at bedtime. ) 30 tablet 12  . Cyanocobalamin (VITAMIN B-12 PO) Place 1 mL under the tongue daily.    Marland Kitchen escitalopram (LEXAPRO) 10 MG tablet Take 10 mg by mouth at bedtime.     . isosorbide mononitrate (IMDUR) 30 MG 24 hr tablet Take 1 tablet (30 mg total) by mouth daily. 30 tablet 11  . levothyroxine (SYNTHROID, LEVOTHROID) 25 MCG tablet Take 25 mcg by mouth daily before breakfast.    . metoprolol succinate (TOPROL-XL) 25 MG 24 hr tablet Take 1 tablet (25 mg total) by mouth daily. Take with or immediately following a meal. 90 tablet 3  . Multiple Vitamin (MULTIVITAMIN WITH MINERALS) TABS tablet Take 1 tablet by mouth daily.     . nitroGLYCERIN (NITROSTAT) 0.4 MG SL tablet Place 0.4 mg under the tongue daily as needed for chest pain.     . predniSONE (DELTASONE) 10 MG tablet Take 4 tablets (40 mg total) by mouth daily. 20 tablet 0  . ramipril (ALTACE) 2.5 MG capsule Take 2.5 mg by mouth at bedtime.     Marland Kitchen  torsemide (DEMADEX) 20 MG tablet Take 20 mg by mouth daily.      No current facility-administered medications on file prior to visit.     Past Medical History:  Diagnosis Date  . Anxiety   . Arthritis    "knees, back, shoulders" (10/22/2015)  . CAD (coronary artery disease)    a. remote stenting >20 years ago. b. PTCA unknown vessel ~2013. c. 10/2015: s/p rotational atherectomy/DES to distal RCA and RPDA c/b embolic CVA  . CHB (complete heart block) ([place]) 02/2015   [name] 03/03/2015  . Chronic diastolic CHF (congestive heart failure) ([place])   . Chronic shoulder pain   . [name] (chronic kidney disease), stage III ([place])   . COPD  (chronic obstructive pulmonary disease) ([place])   . Dilated aortic root ([place])    a. mild by echo 09/2015.  Marland Kitchen Heart attack [facility]) 'many years ago'  . High cholesterol   . Hypertension   . OSA (obstructive sleep apnea)    "suppose to have a mask; they haven't got one adjusted for him yet" (10/22/2015)  . Presence of permanent cardiac pacemaker   . Sinus arrest   . Skin cancer of face    "had it cut off"  . Statin intolerance   . Symptomatic bradycardia    a. syncope/sinus arrest and bradycardia s/p St. Jude PPM 02/2015 with lead revision 03/2015.    Past Surgical History:  Procedure Laterality Date  . CARDIAC CATHETERIZATION N/A 10/17/2015   Procedure: Left Heart Cath and Coronary Angiography;  Surgeon: Jettie Booze, MD;  Location: [place] [facility];  Service: Cardiovascular;  Laterality: N/A;  . CARDIAC CATHETERIZATION N/A 10/22/2015   Procedure: Coronary Stent Intervention Rotablater;  Surgeon: Jettie Booze, MD;  Location: [place] [facility];  Service: Cardiovascular;  Laterality: N/A;  . CARDIAC CATHETERIZATION N/A 10/22/2015   Procedure: Left Heart Cath and Coronary Angiography;  Surgeon: Jettie Booze, MD;  Location: [place] [facility];  Service: Cardiovascular;  Laterality: N/A;  . CATARACT EXTRACTION W/ INTRAOCULAR LENS  IMPLANT, BILATERAL Bilateral 2016  . CORONARY ANGIOPLASTY  2012  . CORONARY ANGIOPLASTY WITH STENT PLACEMENT  2001  . CORONARY ANGIOPLASTY WITH STENT PLACEMENT  10/22/2015   "took 2 out and put 1 longer one in"  . EP IMPLANTABLE DEVICE N/A 03/04/2015   Procedure: Pacemaker Implant;  Surgeon: Will Meredith Leeds, MD;  Location: [place] [facility];  Service: Cardiovascular;  Laterality: N/A;  . EP IMPLANTABLE DEVICE N/A 04/10/2015   Procedure: PPM Lead Revision/Repair;  Surgeon: Will Meredith Leeds, MD;  Location: [place] [facility];  Service: Cardiovascular;  Laterality: N/A;  . EYE SURGERY    . INSERT / REPLACE / REMOVE PACEMAKER    . RETINAL  DETACHMENT SURGERY Bilateral    "several on each side"    Family History  Problem Relation Age of Onset  . Heart disease Father   . Heart disease Mother   . Diabetes Sister   . Cancer Sister   . Diabetes Brother   . Lung disease Neg Hx     Social History   Social History  . Marital status: Married    Spouse name: N/A  . Number of children: N/A  . Years of education: N/A   Social History Main Topics  . Smoking status: Former Smoker    Packs/day: 1.00    Years: 11.00    Types: Cigarettes    Start date: 09/09/1947    Quit date: 09/09/1958  . Smokeless  tobacco: Never Used     Comment: Significant second-hand exposure. Wife also smokes around him.  . Alcohol use Yes     Comment: 10/22/2015 "might have 1 glass of wine/month, if that  . Drug use: No  . Sexual activity: Yes   Other Topics Concern  . None   Social History Narrative   [facility] (10/25/16):   Originally from [place]. Has lived in [place] most of his life. He was a Company secretary. No pets currently. No bird exposure. No mold exposure. Previously like going camping. 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 Significant exposure to dust, etc through his work. No known asbestos exposure.       Objective:   Physical Exam BP 140/80 (BP Location: Right Arm, Cuff Size: Normal)   Pulse 77   Ht 6' (1.829 m)   Wt 232 lb 4 oz (105.3 kg)   SpO2 94%   BMI 31.50 kg/m  General:  Awake. Alert. No acute distress. Accompanied by wife today. Obese.  Integument:  Warm & dry. No rash on exposed skin.  Extremities:  No cyanosis or clubbing.  Lymphatics:  No appreciated cervical or supraclavicular lymphadenoapthy. HEENT:  Dry mucus membranes. No oral ulcers. No scleral injection or icterus.  Cardiovascular:  Regular rate. No edema. Regular rhythm.  Pulmonary:  Clear bilaterally with auscultation. Normal work of breathing on room air. Speaking in complete sentences. Abdomen: Soft. Normal bowel sounds.  Protuberant. Grossly nontender. Musculoskeletal:  Normal bulk and tone. Hand grip strength 5/5 bilaterally. No joint deformity or effusion appreciated. Neurological:  CN 2-12 grossly in tact. No meningismus. Moving all 4 extremities equally. Symmetric brachioradialis deep tendon reflexes. Psychiatric:  Mood and affect congruent. Speech normal rhythm, rate & tone.   IMAGING CXR PA/LAT 10/22/16 (personally reviewed by me):  Chronic elevation of right hemidiaphragm. No focal opacity appreciated. No pleural effusion or thickening. Implanted device within left anterior chest appreciated with leads projecting to the heart. Heart with cardiomegaly & mediastinum normal in contour.  CARDIAC TTE (10/13/16):  LV normal in size with moderate LVH. EF 60-65% with no regional wall motion abnormalities & grade 1 diastolic dysfunction. LA & RA normal in size. RV mildly dilated with mild reduction in systolic function. Pulmonary artery systolic pressure 23 mmHg. Trivial aortic regurgitation without stenosis. Aortic root normal in size. No mitral stenosis or regurgitation. No pulmonic regurgitation. Trivial tricuspid regurgitation. No pericardial effusion.  CT CHEST W/ CONTRAST 03/04/15 (personally reviewed by me):  No central pulmonary emboli appreciated. Pulmonic trunk approximately 3.1 cm by my review. No pathologic mediastinal adenopathy. No pericardial effusion. No parenchymal nodule or opacity appreciated. Minimal atelectasis at the dome of the right hemidiaphragm from compression. Elevation of right hemidiaphragm again noted. Trace bilateral pleural effusions versus pleural thickening with some rounded atelectasis versus trapped pleural fluid in right lateral chest. No pericardial effusion. Radiology also noted hypodensities within the liver consistent with cysts.  LHC (10/22/15):  Dist RCA lesion, 90 %stenosed. RPDA lesion, 90 %stenosed. Rotational atherectomy was performed on both vessels with 1.5 and 1.75  burrs.  A STENT SYNERGY DES 2.75X28 drug eluting stent was successfully placed- covering both lesions, postdilated with a 3.5 balloon.  Post intervention, there is a 0% residual stenosis.  LV end diastolic pressure is normal.  There is no aortic valve stenosis.  LHC (10/17/15):  Mid LAD lesion, 80 %stenosed, extending into the Ost 2nd Diag lesion, 90 %stenosed. The diagonal is larger than the continuation of the LAD.  Dist RCA lesion, 90 %stenosed.  RPDA lesion, 90 %stenosed. This appears to be instent restenosis. We do not have records of where his prior stents were placed.  THe RCA and PDA lesions are likely the culprit for his symptoms.  Ost Cx to Mid Cx lesion, 25 %stenosed.  The left ventricular systolic function is normal.  The left ventricular ejection fraction is 55-65% by visual estimate.  There is no aortic valve stenosis.  LABS 10/22/16 CBC: 7.4/10.4/33.2/220 BMP: 138/3.2/102/22/14/1.51/174/9.0 LFT: 3.6/7.1/0.7/71/30/19 BNP: 35.1    Assessment & Plan:  76 y.o. male with a history of multiple medical problems including coronary artery disease. He was previously diagnosed at outside hospital in June after a hospitalization. COPD is certainly possible with his history of significant secondhand smoke exposure as well as his own personal exposure. I would not expect an arrhythmia given the fact the patient currently does have a pacemaker in place. He does not appear grossly volume overloaded at this time and is adherent to his diuretic therapy. Pulmonary hypertension is suggested by his echocardiogram and this is likely multifactorial not only from his diastolic dysfunction but also from his underlying obstructive sleep apnea which at this time is not being treated. Patient has had difficulties tolerating masks in the past with his CPAP per her report. I reviewed the patient's most recent x-ray as well as a CT scan from February 2017. Neither of these showed any parenchymal  source for his abrupt and intermittent episodes of dyspnea. I instructed the patient and his wife to contact my office if they have any new breathing problems or questions while we gather data from his previous pulmonologist and evaluate his pulmonary function.  1. Dyspnea: Unclear etiology. Performing workup & obtaining records as follows.  2. COPD: Unclear severity. Checking full pulmonary function testing and 6 minute walk test on room air before next appointment. Continuing albuterol inhaler as needed. 3. OSA: Obtaining prior records and sleep study/titration reports from his previous pulmonologist. 4. Possible pulmonary hypertension: Suggested by echocardiogram. Appears euvolemic. Depending upon results of workup noted previously patient may need right heart catheterization. 5. Health maintenance: Plan to discuss at follow-up appointment. 6. Follow-up: Return to clinic in 2 weeks.  Sonia Baller Ashok Cordia, M.D. [facility] Pager:  [phone] After 7pm or if no response, call [phone] 1:55 PM 10/25/16

## 2016-10-25 NOTE — Patient Instructions (Signed)
   Continue using your Albuterol inhaler and nebulizer as needed.  We will see you back once we have your breathing tests done & records from your previous Pulmonologist.  TESTS ORDERED: 1. Full PFTs before next appointment 2. 6MWT on room air before next appointment

## 2016-11-02 ENCOUNTER — Telehealth: Payer: Self-pay | Admitting: Pulmonary Disease

## 2016-11-02 ENCOUNTER — Ambulatory Visit (INDEPENDENT_AMBULATORY_CARE_PROVIDER_SITE_OTHER): Payer: Medicare Other | Admitting: Pulmonary Disease

## 2016-11-02 DIAGNOSIS — J449 Chronic obstructive pulmonary disease, unspecified: Secondary | ICD-10-CM

## 2016-11-02 LAB — PULMONARY FUNCTION TEST
DL/VA % pred: 71 %
DL/VA: 3.37 ml/min/mmHg/L
DLCO UNC: 15.26 ml/min/mmHg
DLCO cor % pred: 42 %
DLCO cor: 15.53 ml/min/mmHg
DLCO unc % pred: 41 %
FEF 25-75 POST: 3.44 L/s
FEF 25-75 Pre: 2.23 L/sec
FEF2575-%CHANGE-POST: 54 %
FEF2575-%PRED-POST: 146 %
FEF2575-%Pred-Pre: 94 %
FEV1-%CHANGE-POST: 12 %
FEV1-%PRED-POST: 75 %
FEV1-%PRED-PRE: 66 %
FEV1-POST: 2.5 L
FEV1-PRE: 2.22 L
FEV1FVC-%Change-Post: 7 %
FEV1FVC-%PRED-PRE: 110 %
FEV6-%Change-Post: 4 %
FEV6-%Pred-Post: 67 %
FEV6-%Pred-Pre: 64 %
FEV6-PRE: 2.79 L
FEV6-Post: 2.92 L
FEV6FVC-%Change-Post: 0 %
FEV6FVC-%Pred-Post: 106 %
FEV6FVC-%Pred-Pre: 106 %
FVC-%CHANGE-POST: 4 %
FVC-%PRED-PRE: 60 %
FVC-%Pred-Post: 63 %
FVC-POST: 2.92 L
FVC-PRE: 2.79 L
POST FEV1/FVC RATIO: 86 %
PRE FEV6/FVC RATIO: 100 %
Post FEV6/FVC ratio: 100 %
Pre FEV1/FVC ratio: 80 %

## 2016-11-02 NOTE — Telephone Encounter (Signed)
POLYSOMNOGRAM (03/05/16):  Overall AHI 45.4 events/hour with REM AHI  41.5 events/hour & non-REM AHI 45.9 events/hour. supine AHI was 57.2 events/hour with right-sided AHI 31.8 events/hour. Mean saturation 93.1% with a nadir saturation of 66% during REM sleep. EKG showed pacemaker rhythm with PVCs. Periodic leg movement with arousal index of 3.6 events/hour. Overall RDI was severe at 63.5 events/hour.

## 2016-11-02 NOTE — Patient Instructions (Signed)
PFT done today. 

## 2016-11-07 NOTE — Progress Notes (Signed)
Subjective:    Patient ID: [name], male    DOB: 11/04/1940, 76 y.o.   MRN: [id number]  C.C.:  Follow-up for COPD, Moderate Restrictive Lung Disease, OSA & Possible Pulmonary Hypertension.   HPI COPD:  Severity masked by restriction on spirometry but appears moderate. Significant bronchodilator response was present on pulmonary function testing from 10/23. He is still having episodes of dyspnea, coughing, and wheezing. He forgets to use his Albuterol rescue inhaler but does use his Nebulizer with relief during his dyspneic spells - every 2-3 days.   Moderate Restrictive Lung Disease: Present on lung volumes from 10/23.patient does have a significant reduction in ERV suggesting a significant component from obesity.  OSA:  Polysomnogram from February 2018 with overall AHI 45.4 events/hr with nadir saturation of 66%. He has not been set up on CPAP or done a titration study.  Possible Pulmonary Hypertension:suggested by echocardiogram. Patient has not yet had a right heart catheterization. Currently on diuretics with Demadex and Imdur as well.  Review of Systems He does have rare chest pain but nothing that is constant. No constant abdominal pain. No nausea or emesis. No fever or chills.   Allergies  Allergen Reactions  . Novocain [Procaine] Other (See Comments)    Hallucinations  . Rosuvastatin Calcium Other (See Comments)    Whole body ache/pain  . Statins Other (See Comments)    Whole body ache/pain    Current Outpatient Prescriptions on File Prior to Visit  Medication Sig Dispense Refill  . acetaminophen (TYLENOL) 500 MG tablet Take 500 mg by mouth every 6 (six) hours as needed for headache.    . albuterol (PROAIR HFA) 108 (90 Base) MCG/ACT inhaler Inhale 1-2 puffs into the lungs every 4 (four) hours as needed for shortness of breath.    Marland Kitchen albuterol (PROVENTIL) (2.5 MG/3ML) 0.083% nebulizer solution Take 2.5 mg by nebulization every 6 (six) hours as needed for wheezing or  shortness of breath.    Marland Kitchen aspirin EC 81 MG tablet Take 81 mg by mouth at bedtime.     . clopidogrel (PLAVIX) 75 MG tablet Take 1 tablet (75 mg total) by mouth daily. (Patient taking differently: Take 75 mg by mouth at bedtime. ) 30 tablet 12  . Cyanocobalamin (VITAMIN B-12 PO) Place 1 mL under the tongue daily.    Marland Kitchen escitalopram (LEXAPRO) 10 MG tablet Take 10 mg by mouth at bedtime.     . isosorbide mononitrate (IMDUR) 30 MG 24 hr tablet Take 1 tablet (30 mg total) by mouth daily. 30 tablet 11  . levothyroxine (SYNTHROID, LEVOTHROID) 25 MCG tablet Take 25 mcg by mouth daily before breakfast.    . metoprolol succinate (TOPROL-XL) 25 MG 24 hr tablet Take 1 tablet (25 mg total) by mouth daily. Take with or immediately following a meal. 90 tablet 3  . Multiple Vitamin (MULTIVITAMIN WITH MINERALS) TABS tablet Take 1 tablet by mouth daily.     . nitroGLYCERIN (NITROSTAT) 0.4 MG SL tablet Place 0.4 mg under the tongue daily as needed for chest pain.     . ramipril (ALTACE) 2.5 MG capsule Take 2.5 mg by mouth at bedtime.     . torsemide (DEMADEX) 20 MG tablet Take 20 mg by mouth daily.      No current facility-administered medications on file prior to visit.     Past Medical History:  Diagnosis Date  . Anxiety   . Arthritis    "knees, back, shoulders" (10/22/2015)  . CAD (coronary artery disease)  a. remote stenting >20 years ago. b. PTCA unknown vessel ~2013. c. 10/2015: s/p rotational atherectomy/DES to distal RCA and RPDA c/b embolic CVA  . CHB (complete heart block) ([place]) 02/2015   [name] 03/03/2015  . Chronic diastolic CHF (congestive heart failure) ([place])   . Chronic shoulder pain   . [name] (chronic kidney disease), stage III ([place])   . COPD (chronic obstructive pulmonary disease) ([place])   . Dilated aortic root ([place])    a. mild by echo 09/2015.  Marland Kitchen Heart attack [facility]) 'many years ago'  . High cholesterol   . Hypertension   . OSA (obstructive sleep apnea)    "suppose to have a mask; they  haven't got one adjusted for him yet" (10/22/2015)  . Presence of permanent cardiac pacemaker   . Sinus arrest   . Skin cancer of face    "had it cut off"  . Statin intolerance   . Symptomatic bradycardia    a. syncope/sinus arrest and bradycardia s/p St. Jude PPM 02/2015 with lead revision 03/2015.    Past Surgical History:  Procedure Laterality Date  . CARDIAC CATHETERIZATION N/A 10/17/2015   Procedure: Left Heart Cath and Coronary Angiography;  Surgeon: Jettie Booze, MD;  Location: [facility];  Service: Cardiovascular;  Laterality: N/A;  . CARDIAC CATHETERIZATION N/A 10/22/2015   Procedure: Coronary Stent Intervention Rotablater;  Surgeon: Jettie Booze, MD;  Location: [facility];  Service: Cardiovascular;  Laterality: N/A;  . CARDIAC CATHETERIZATION N/A 10/22/2015   Procedure: Left Heart Cath and Coronary Angiography;  Surgeon: Jettie Booze, MD;  Location: [facility];  Service: Cardiovascular;  Laterality: N/A;  . CATARACT EXTRACTION W/ INTRAOCULAR LENS  IMPLANT, BILATERAL Bilateral 2016  . CORONARY ANGIOPLASTY  2012  . CORONARY ANGIOPLASTY WITH STENT PLACEMENT  2001  . CORONARY ANGIOPLASTY WITH STENT PLACEMENT  10/22/2015   "took 2 out and put 1 longer one in"  . EP IMPLANTABLE DEVICE N/A 03/04/2015   Procedure: Pacemaker Implant;  Surgeon: Will Meredith Leeds, MD;  Location: [facility];  Service: Cardiovascular;  Laterality: N/A;  . EP IMPLANTABLE DEVICE N/A 04/10/2015   Procedure: PPM Lead Revision/Repair;  Surgeon: Will Meredith Leeds, MD;  Location: [facility];  Service: Cardiovascular;  Laterality: N/A;  . EYE SURGERY    . INSERT / REPLACE / REMOVE PACEMAKER    . RETINAL DETACHMENT SURGERY Bilateral    "several on each side"    Family History  Problem Relation Age of Onset  . Heart disease Father   . Heart disease Mother   . Diabetes Sister   . Cancer Sister   . Diabetes Brother   . Lung disease Neg Hx      Social History   Social History  . Marital status: Married    Spouse name: N/A  . Number of children: N/A  . Years of education: N/A   Social History Main Topics  . Smoking status: Former Smoker    Packs/day: 1.00    Years: 11.00    Types: Cigarettes    Start date: 09/09/1947    Quit date: 09/09/1958  . Smokeless tobacco: Never Used     Comment: Significant second-hand exposure. Wife also smokes around him.  . Alcohol use Yes     Comment: 10/22/2015 "might have 1 glass of wine/month, if that  . Drug use: No  . Sexual activity: Yes   Other Topics Concern  . None   Social History Narrative  [facility] (10/25/16):   Originally from [place]. Has lived in [place] most of his life. He was a Company secretary. No pets currently. No bird exposure. No mold exposure. Previously like going camping. 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 Significant exposure to dust, etc through his work. No known asbestos exposure.       Objective:   Physical Exam BP 132/70 (BP Location: Left Arm, Cuff Size: Normal)   Pulse 62   Ht 6' (1.829 m)   Wt 233 lb (105.7 kg)   SpO2 97%   BMI 31.60 kg/m   General:  Awake. Alert. Central obesity.  Integument:  Warm. Dry. No rash. Extremities:  No cyanosis or clubbing.  HEENT:  Moist mucus membranes. No oral ulcers. No nasal turbinate swelling. No scleral icterus. Cardiovascular:  Regular rate. No edema. Unable to appreciate JVD.  Pulmonary:  Good aeration bilaterally. Clear with auscultation. Normal work of breathing. Abdomen: Soft. Normal bowel sounds. Protuberant. Musculoskeletal:  Normal bulk and tone. No joint deformity or effusion appreciated. Neurological:  Cranial nerves 2-12 grossly in tact. No meningismus. Moving all 4 extremities equally.   PFT 11/02/16: FVC 2.79 L (60%) FEV1 2.22 L (66%) FEV1/FVC 0.80 FEF 25-75 2.23 L (94%) positive bronchodilator response TLC 3.3 L (50%) RV 49% ERV 19% DLCO corrected 42%  6MWT 11/08/16:  Walked 192 meters / Baseline Sat 100% on RA / Nadir Sat 98% on RA   POLYSOMNOGRAM (03/05/16):  Overall AHI 45.4 events/hour with REM AHI  41.5 events/hour & non-REM AHI 45.9 events/hour. Supine AHI was 57.2 events/hour with right-sided AHI 31.8 events/hour. Mean saturation 93.1% with a nadir saturation of 66% during REM sleep. EKG showed pacemaker rhythm with PVCs. Periodic leg movement with arousal index of 3.6 events/hour. Overall RDI was severe at 63.5 events/hour.  IMAGING CXR PA/LAT 10/22/16 (previously reviewed by me):  Chronic elevation of right hemidiaphragm. No focal opacity appreciated. No pleural effusion or thickening. Implanted device within left anterior chest appreciated with leads projecting to the heart. Heart with cardiomegaly & mediastinum normal in contour.  CT CHEST W/ CONTRAST 03/04/15 (previously reviewed by me):  No central pulmonary emboli appreciated. Pulmonic trunk approximately 3.1 cm by my review. No pathologic mediastinal adenopathy. No pericardial effusion. No parenchymal nodule or opacity appreciated. Minimal atelectasis at the dome of the right hemidiaphragm from compression. Elevation of right hemidiaphragm again noted. Trace bilateral pleural effusions versus pleural thickening with some rounded atelectasis versus trapped pleural fluid in right lateral chest. No pericardial effusion. Radiology also noted hypodensities within the liver consistent with cysts.  CARDIAC TTE (10/13/16):  LV normal in size with moderate LVH. EF 60-65% with no regional wall motion abnormalities & grade 1 diastolic dysfunction. LA & RA normal in size. RV mildly dilated with mild reduction in systolic function. Pulmonary artery systolic pressure 23 mmHg. Trivial aortic regurgitation without stenosis. Aortic root normal in size. No mitral stenosis or regurgitation. No pulmonic regurgitation. Trivial tricuspid regurgitation. No pericardial effusion.  LHC (10/22/15):  Dist RCA lesion, 90 %stenosed.  RPDA lesion, 90 %stenosed. Rotational atherectomy was performed on both vessels with 1.5 and 1.75 burrs.  A STENT SYNERGY DES 2.75X28 drug eluting stent was successfully placed- covering both lesions, postdilated with a 3.5 balloon.  Post intervention, there is a 0% residual stenosis.  LV end diastolic pressure is normal.  There is no aortic valve stenosis.  LHC (10/17/15):  Mid LAD lesion, 80 %stenosed, extending into the Ost 2nd Diag lesion, 90 %stenosed. The  diagonal is larger than the continuation of the LAD.  Dist RCA lesion, 90 %stenosed.  RPDA lesion, 90 %stenosed. This appears to be instent restenosis. We do not have records of where his prior stents were placed.  THe RCA and PDA lesions are likely the culprit for his symptoms.  Ost Cx to Mid Cx lesion, 25 %stenosed.  The left ventricular systolic function is normal.  The left ventricular ejection fraction is 55-65% by visual estimate.  There is no aortic valve stenosis.  LABS 10/22/16 CBC: 7.4/10.4/33.2/220 BMP: 138/3.2/102/22/14/1.51/174/9.0 LFT: 3.6/7.1/0.7/71/30/19 BNP: 35.1    Assessment & Plan:  76 y.o. male with evidence of severe sleep apnea based on polysomnogram from earlier this year. I believe this may indeed be affecting his memory and mentation. We also reviewed his pulmonary function testing with suggest at least moderate airway obstruction and also shows a significant bronchodilator response. However, this is masked by his moderate restrictive lung disease. At this time he has no suggestion of interstitial lung disease on his chest imaging. I believe his underlying probable pulmonary hypertension is likely secondary to not only his sleep apnea but also his left ventricular failure as well as his underlying COPD. I instructed the patient and his wife to contact my office if he had any new breathing problems or questions before his next appointment.  1. COPD: Screening for alpha-1 antitrypsin deficiency.  Starting patient on Bevespi. Repeat spirometry with bronchodilator challenge at next appointment to ensure maximal bronchodilatation. 2. Moderate restrictive lung disease: Likely secondary to patient's obesity and heart failure. No suggestion of interstitial lung disease on CT imaging. 3. OSA: Ordering CPAP titration. 4. Possible pulmonary hypertension: Likely multifactorial. Continuing with treatment of underlying sleep apnea and COPD. 5. Health maintenance: Plan to address at next appointment. 6. Follow-up:  Return to clinic in 4 weeks or sooner if needed.  Sonia Baller Ashok Cordia, M.D. [facility] Pager:  [phone] After 7pm or if no response, call [phone] 9:50 AM 11/08/16

## 2016-11-08 ENCOUNTER — Encounter: Payer: Self-pay | Admitting: Pulmonary Disease

## 2016-11-08 ENCOUNTER — Ambulatory Visit (INDEPENDENT_AMBULATORY_CARE_PROVIDER_SITE_OTHER): Payer: Medicare Other | Admitting: Pulmonary Disease

## 2016-11-08 ENCOUNTER — Ambulatory Visit (INDEPENDENT_AMBULATORY_CARE_PROVIDER_SITE_OTHER): Payer: Medicare Other | Admitting: *Deleted

## 2016-11-08 ENCOUNTER — Other Ambulatory Visit: Payer: Medicare Other

## 2016-11-08 VITALS — BP 132/70 | HR 62 | Ht 72.0 in | Wt 233.0 lb

## 2016-11-08 DIAGNOSIS — J984 Other disorders of lung: Secondary | ICD-10-CM

## 2016-11-08 DIAGNOSIS — J449 Chronic obstructive pulmonary disease, unspecified: Secondary | ICD-10-CM | POA: Diagnosis not present

## 2016-11-08 DIAGNOSIS — G4733 Obstructive sleep apnea (adult) (pediatric): Secondary | ICD-10-CM | POA: Diagnosis not present

## 2016-11-08 MED ORDER — GLYCOPYRROLATE-FORMOTEROL 9-4.8 MCG/ACT IN AERO: 2.0000 | INHALATION_SPRAY | Freq: Two times a day (BID) | RESPIRATORY_TRACT | 3 refills | Status: DC

## 2016-11-08 MED ORDER — GLYCOPYRROLATE-FORMOTEROL 9-4.8 MCG/ACT IN AERO: 2.0000 | INHALATION_SPRAY | Freq: Two times a day (BID) | RESPIRATORY_TRACT | 0 refills | Status: AC

## 2016-11-08 MED ORDER — SPACER/AERO CHAMBER MOUTHPIECE MISC: 1.0000 | 0 refills | Status: DC

## 2016-11-08 NOTE — Progress Notes (Signed)
SIX MIN WALK 11/08/2016  Medications no meds taken today   Supplimental Oxygen during Test? (L/min) No  Laps 4  Partial Lap (in Meters) 0  Baseline BP (sitting) 122/74  Baseline Heartrate 60  Baseline Dyspnea (Borg Scale) 1  Baseline Fatigue (Borg Scale) 4  Baseline SPO2 100  BP (sitting) 140/90  Heartrate 101  Dyspnea (Borg Scale) 4  Fatigue (Borg Scale) 5  SPO2 98  BP (sitting) 136/82  Heartrate 63  SPO2 100  Stopped or Paused before Six Minutes No  Distance Completed 192  Tech Comments: pt completed test at slow pace with no desats or complaints. test performed with forehead probe.

## 2016-11-08 NOTE — Patient Instructions (Addendum)
   Call our office if you have any questions or concerns.  Use of the Bevespi inhaler we are giving you today by doing 2 puffs twice daily with the spacer we are giving you.  We have also sent a prescription to your pharmacy and you can use the prescription drug card we gave you today to get her first month free.  You can continue to use albuterol in your nebulizer and also your albuterol inhaler as needed for any coughing, wheezing, or periods of shortness of breath.  We will see back in 4 weeks to check on how you're doing.  TESTS ORDERED: 1. CPAP Titration 2. Serum alpha-1 Antitrypsin 3. Spirometry with bronchodilator challenge at next appointment

## 2016-11-11 LAB — ALPHA-1 ANTITRYPSIN PHENOTYPE: A-1 Antitrypsin, Ser: 178 mg/dL (ref 83–199)

## 2016-11-18 ENCOUNTER — Ambulatory Visit (HOSPITAL_BASED_OUTPATIENT_CLINIC_OR_DEPARTMENT_OTHER): Payer: Medicare Other | Attending: Pulmonary Disease | Admitting: Pulmonary Disease

## 2016-11-18 VITALS — Ht 72.0 in | Wt 231.0 lb

## 2016-11-18 DIAGNOSIS — Z7902 Long term (current) use of antithrombotics/antiplatelets: Secondary | ICD-10-CM | POA: Insufficient documentation

## 2016-11-18 DIAGNOSIS — G4733 Obstructive sleep apnea (adult) (pediatric): Secondary | ICD-10-CM | POA: Insufficient documentation

## 2016-11-18 DIAGNOSIS — Z7982 Long term (current) use of aspirin: Secondary | ICD-10-CM | POA: Diagnosis not present

## 2016-11-18 DIAGNOSIS — Z79899 Other long term (current) drug therapy: Secondary | ICD-10-CM | POA: Insufficient documentation

## 2016-11-18 DIAGNOSIS — G4761 Periodic limb movement disorder: Secondary | ICD-10-CM | POA: Diagnosis not present

## 2016-11-18 DIAGNOSIS — J449 Chronic obstructive pulmonary disease, unspecified: Secondary | ICD-10-CM | POA: Diagnosis not present

## 2016-11-23 DIAGNOSIS — G4733 Obstructive sleep apnea (adult) (pediatric): Secondary | ICD-10-CM | POA: Diagnosis not present

## 2016-11-23 NOTE — Procedures (Signed)
Patient Name: [name], [name] Date: 11/18/2016 Gender: Male D.O.B: [birth year] Age (years): 76 Referring Provider: Javier Glazier Height (inches): 72 Interpreting Physician: Kara Mead MD, ABSM Weight (lbs): 231 RPSGT: Zadie Rhine BMI: 31 MRN: [id number] Neck Size: 17.00   CLINICAL INFORMATION The patient is referred for a CPAP titration to treat sleep apnea.  Date of NPSG: February 2018 with overall AHI 45.4 events/hr with nadir saturation of 66%.  SLEEP STUDY TECHNIQUE As per the AASM Manual for the Scoring of Sleep and Associated Events v2.3 (April 2016) with a hypopnea requiring 4% desaturations.  The channels recorded and monitored were frontal, central and occipital EEG, electrooculogram (EOG), submentalis EMG (chin), nasal and oral airflow, thoracic and abdominal wall motion, anterior tibialis EMG, snore microphone, electrocardiogram, and pulse oximetry. Continuous positive airway pressure (CPAP) was initiated at the beginning of the study and titrated to treat sleep-disordered breathing.  MEDICATIONS Medications self-administered by patient taken the night of the study : ESCITALOPRAM, RAMIPRIL, CLOPIDOGREL, ASPIRIN  TECHNICIAN COMMENTS Comments added by technician: [place]. Patient talked in his/her sleep. Patient had difficulty initiating sleep. Patient was restless all through the night.   RESPIRATORY PARAMETERS Optimal PAP Pressure (cm): 14 AHI at Optimal Pressure (/hr): 0.0 Overall Minimal O2 (%): 86.00 Supine % at Optimal Pressure (%): 100 Minimal O2 at Optimal Pressure (%): 89.0     SLEEP ARCHITECTURE The study was initiated at 10:09:59 PM and ended at 4:45:02 AM.  Sleep onset time was 4.8 minutes and the sleep efficiency was 57.6%. The total sleep time was 227.5 minutes.  The patient spent 15.82% of the night in stage N1 sleep, 81.98% in stage N2 sleep, 0.00% in stage N3 and 2.20% in REM.Stage REM latency was 101.5 minutes  Wake after  sleep onset was 162.8. Alpha intrusion was absent. Supine sleep was 69.02%.  CARDIAC DATA The 2 lead EKG demonstrated sinus rhythm, pacemaker generated. The mean heart rate was 74.07 beats per minute. Other EKG findings include: PVCs.   LEG MOVEMENT DATA The total Periodic Limb Movements of Sleep (PLMS) were 41. The PLMS index was 10.81. A PLMS index of <15 is considered normal in adults.  IMPRESSIONS - The optimal PAP pressure was 14 cm of water. - Central sleep apnea was not noted during this titration (CAI = 0.3/h). - Moderate oxygen desaturations were observed during this titration (min O2 = 86.00%). - No snoring was audible during this study. - 2-lead EKG demonstrated: PVCs - Mild periodic limb movements were observed during this study. Arousals associated with PLMs were rare.   DIAGNOSIS - Obstructive Sleep Apnea (327.23 [G47.33 ICD-10])   RECOMMENDATIONS - Trial of CPAP therapy on 14 cm H2O with a Medium size Resmed Full Face Mask AirFit F20 mask and heated humidification. - Avoid alcohol, sedatives and other CNS depressants that may worsen sleep apnea and disrupt normal sleep architecture. - Sleep hygiene should be reviewed to assess factors that may improve sleep quality. - Weight management and regular exercise should be initiated or continued. - Return to [facility] Center for re-evaluation after 4 weeks of therapy    Kara Mead MD Board Certified in [place]

## 2016-11-24 ENCOUNTER — Telehealth: Payer: Self-pay | Admitting: Interventional Cardiology

## 2016-11-24 NOTE — Telephone Encounter (Signed)
New message    [facility]ene home health nurse [phone]  Calling to report HR concerns. States patient has no other symptoms, "feels good"  STAT if HR is under 50 or over 120 (normal HR is 60-100 beats per minute)  1) What is your heart rate? 116-->120   2) Do you have a log of your heart rate readings (document readings)? n/a  3) Do you have any other symptoms? NO

## 2016-11-24 NOTE — Telephone Encounter (Signed)
Called and spoke to [place], [facility] Nurse, who was wanting to report that the patient's HR was elevated at her check today. She states that his HR was between 102 and 120. She states that it was a strong, regular pulse. Patient's BP was 148/78. She states that his SpO2 was 96%. She states that the patient denied having any chest pain, lightheadedness, feeling any palpitations, or having any symptoms at all. She states that the patient said that he felt good and Randell Patient states that he looked the best that she has seen him. Her only concern was his HR being elevated. Patient is taking metoprolol succinate 25 mg QD and torsemide 20 mg QD. She states the patient did not appear to be dehydrated. Made Charlene aware that the patient has a PPM and that he should send in a remote transmission for evaluation. Patient does have a history of symptomatic bradycardia as well as SVT that has been seen through his device. Metoprolol succinate decreased in 07/2016 when seen by Dr. Curt Bears due to fatigue. Advised her to let us know if he becomes symptomatic. Will forward for review.

## 2016-11-25 ENCOUNTER — Telehealth: Payer: Self-pay

## 2016-11-25 DIAGNOSIS — G4733 Obstructive sleep apnea (adult) (pediatric): Secondary | ICD-10-CM

## 2016-11-25 DIAGNOSIS — Z148 Genetic carrier of other disease: Secondary | ICD-10-CM | POA: Insufficient documentation

## 2016-11-25 MED ORDER — DILTIAZEM HCL ER COATED BEADS 180 MG PO CP24: 180.0000 mg | ORAL_CAPSULE | Freq: Every day | ORAL | 2 refills | Status: DC

## 2016-11-25 MED ORDER — DILTIAZEM HCL ER COATED BEADS 180 MG PO CP24: 180.0000 mg | ORAL_CAPSULE | Freq: Every day | ORAL | 1 refills | Status: DC

## 2016-11-25 NOTE — Progress Notes (Signed)
Subjective:    Patient ID: [name], male    DOB: 06-05-1940, 76 y.o.   MRN: [id number]  C.C.:  Follow-up for COPD, OSA, Possible Pulmonary Hypertension,& Moderate Restrictive Lung Disease.   HPI Patient developed acute onset chest discomfort preceded by dyspnea on exertion with walking into our office this morning.   COPD: Severity masked by restriction. Patient started on Bevespi at last appointment. Serum workup did show a carrier status for alpha-1 antitrypsin:  FM.  OSA: Previous polysomnogram in February revealed AHI 45.4 events/hour & lowest saturation 66%. CPAP titration ordered at last appointment demonstrating a 14 cm H2O water pressure requirement with a medium-sized fullface mask was necessary to treat the patient effectively. This was subsequently ordered earlier this month.  Possible pulmonary hypertension: Suggested by echocardiogram. Previously on diuretic therapy. Has not yet undergone right heart catheterization.   Moderate Restrictive Lung Disease: Present on lung volumes from 10/23.patient does have a significant reduction in ERV suggesting a significant component from obesity. No imaging to suggest underlying interstitial lung disease.  Review of Systems Unable to assess as patient is being transported to the E.D.   Allergies  Allergen Reactions  . Novocain [Procaine] Other (See Comments)    Hallucinations  . Rosuvastatin Calcium Other (See Comments)    Whole body ache/pain  . Statins Other (See Comments)    Whole body ache/pain    Current Outpatient Medications on File Prior to Visit  Medication Sig Dispense Refill  . acetaminophen (TYLENOL) 500 MG tablet Take 500 mg by mouth every 6 (six) hours as needed for headache.    . albuterol (PROAIR HFA) 108 (90 Base) MCG/ACT inhaler Inhale 1-2 puffs into the lungs every 4 (four) hours as needed for shortness of breath.    Marland Kitchen albuterol (PROVENTIL) (2.5 MG/3ML) 0.083% nebulizer solution Take 2.5 mg by  nebulization every 6 (six) hours as needed for wheezing or shortness of breath.    Marland Kitchen aspirin EC 81 MG tablet Take 81 mg by mouth at bedtime.     . clopidogrel (PLAVIX) 75 MG tablet Take 1 tablet (75 mg total) by mouth daily. (Patient taking differently: Take 75 mg by mouth at bedtime. ) 30 tablet 12  . Cyanocobalamin (VITAMIN B-12 PO) Place 1 mL under the tongue daily.    Marland Kitchen diltiazem (CARDIZEM CD) 180 MG 24 hr capsule Take 1 capsule (180 mg total) daily by mouth. 90 capsule 2  . escitalopram (LEXAPRO) 10 MG tablet Take 10 mg by mouth at bedtime.     . Glycopyrrolate-Formoterol (BEVESPI AEROSPHERE) 9-4.8 MCG/ACT AERO Inhale 2 puffs into the lungs 2 (two) times daily. 1 Inhaler 0  . Glycopyrrolate-Formoterol (BEVESPI AEROSPHERE) 9-4.8 MCG/ACT AERO Inhale 2 puffs into the lungs 2 (two) times daily. 1 Inhaler 3  . isosorbide mononitrate (IMDUR) 30 MG 24 hr tablet Take 1 tablet (30 mg total) by mouth daily. 30 tablet 11  . levothyroxine (SYNTHROID, LEVOTHROID) 25 MCG tablet Take 25 mcg by mouth daily before breakfast.    . metoprolol succinate (TOPROL-XL) 25 MG 24 hr tablet Take 1 tablet (25 mg total) by mouth daily. Take with or immediately following a meal. 90 tablet 3  . Multiple Vitamin (MULTIVITAMIN WITH MINERALS) TABS tablet Take 1 tablet by mouth daily.     . nitroGLYCERIN (NITROSTAT) 0.4 MG SL tablet Place 0.4 mg under the tongue daily as needed for chest pain.     . ramipril (ALTACE) 2.5 MG capsule Take 2.5 mg by mouth at bedtime.     Marland Kitchen  Spacer/Aero Chamber Mouthpiece MISC 1 Device by Does not apply route as directed. 1 each 0  . torsemide (DEMADEX) 20 MG tablet Take 20 mg by mouth daily.      No current facility-administered medications on file prior to visit.     Past Medical History:  Diagnosis Date  . Anxiety   . Arthritis    "knees, back, shoulders" (10/22/2015)  . CAD (coronary artery disease)    a. remote stenting >20 years ago. b. PTCA unknown vessel ~2013. c. 10/2015: s/p  rotational atherectomy/DES to distal RCA and RPDA c/b embolic CVA  . CHB (complete heart block) ([place]) 02/2015   [name] 03/03/2015  . Chronic diastolic CHF (congestive heart failure) ([place])   . Chronic shoulder pain   . [name] (chronic kidney disease), stage III ([place])   . COPD (chronic obstructive pulmonary disease) ([place])   . Dilated aortic root ([place])    a. mild by echo 09/2015.  Marland Kitchen Heart attack [facility]) 'many years ago'  . High cholesterol   . Hypertension   . OSA (obstructive sleep apnea)    "suppose to have a mask; they haven't got one adjusted for him yet" (10/22/2015)  . Presence of permanent cardiac pacemaker   . Sinus arrest   . Skin cancer of face    "had it cut off"  . Statin intolerance   . Symptomatic bradycardia    a. syncope/sinus arrest and bradycardia s/p St. Jude PPM 02/2015 with lead revision 03/2015.    Past Surgical History:  Procedure Laterality Date  . CATARACT EXTRACTION W/ INTRAOCULAR LENS  IMPLANT, BILATERAL Bilateral 2016  . CORONARY ANGIOPLASTY  2012  . CORONARY ANGIOPLASTY WITH STENT PLACEMENT  2001  . CORONARY ANGIOPLASTY WITH STENT PLACEMENT  10/22/2015   "took 2 out and put 1 longer one in"  . Coronary Stent Intervention Rotablater N/A 10/22/2015   Performed by Jettie Booze, MD at [place] [facility]  . EYE SURGERY    . INSERT / REPLACE / REMOVE PACEMAKER    . Left Heart Cath and Coronary Angiography N/A 10/22/2015   Performed by Jettie Booze, MD at [place] [facility]  . Left Heart Cath and Coronary Angiography N/A 10/17/2015   Performed by Jettie Booze, MD at [place] [facility]  . Pacemaker Implant N/A 03/04/2015   Performed by Constance Haw, MD at [place] [facility]  . PPM Lead Revision/Repair N/A 04/10/2015   Performed by Constance Haw, MD at [place] [facility]  . RETINAL DETACHMENT SURGERY Bilateral    "several on each side"    Family History  Problem Relation Age of Onset  . Heart disease Father   . Heart disease  Mother   . Diabetes Sister   . Cancer Sister   . Diabetes Brother   . Lung disease Neg Hx     Social History   Socioeconomic History  . Marital status: Married    Spouse name: Not on file  . Number of children: Not on file  . Years of education: Not on file  . Highest education level: Not on file  Social Needs  . Financial resource strain: Not on file  . Food insecurity - worry: Not on file  . Food insecurity - inability: Not on file  . Transportation needs - medical: Not on file  . Transportation needs - non-medical: Not on file  Occupational History  . Not on file  Tobacco Use  . Smoking status:  Former Smoker    Packs/day: 1.00    Years: 11.00    Pack years: 11.00    Types: Cigarettes    Start date: 09/09/1947    Last attempt to quit: 09/09/1958    Years since quitting: 58.2  . Smokeless tobacco: Never Used  . Tobacco comment: Significant second-hand exposure. Wife also smokes around him.  Substance and Sexual Activity  . Alcohol use: Yes    Comment: 10/22/2015 "might have 1 glass of wine/month, if that  . Drug use: No  . Sexual activity: Yes  Other Topics Concern  . Not on file  Social History Narrative   [facility] (10/25/16):   Originally from [place]. Has lived in [place] most of his life. He was a Company secretary. No pets currently. No bird exposure. No mold exposure. Previously like going camping. 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 Significant exposure to dust, etc through his work. No known asbestos exposure.       Objective:   Physical Exam There were no vitals taken for this visit.  General:  Awake. Alert. Mild distress initially. Wife present.  PFT 11/02/16: FVC 2.79 L (60%) FEV1 2.22 L (66%) FEV1/FVC 0.80 FEF 25-75 2.23 L (94%) positive bronchodilator response TLC 3.3 L (50%) RV 49% ERV 19% DLCO corrected 42%  6MWT 11/08/16: Walked 192 meters / Baseline Sat 100% on RA / Nadir Sat 98% on RA   CPAP TITRATION (11/18/16):  Optimal  pressure 14 cm H2O. Central apnea index 0.3 events per hour. Desaturation to 86% was noted during titration. No snoring was audible. PVCs were noted on EKG monitoring. Mild periodic limb movements were also observed.  POLYSOMNOGRAM (03/05/16):  Overall AHI 45.4 events/hour with REM AHI  41.5 events/hour & non-REM AHI 45.9 events/hour. Supine AHI was 57.2 events/hour with right-sided AHI 31.8 events/hour. Mean saturation 93.1% with a nadir saturation of 66% during REM sleep. EKG showed pacemaker rhythm with PVCs. Periodic leg movement with arousal index of 3.6 events/hour. Overall RDI was severe at 63.5 events/hour.  IMAGING CXR PA/LAT 10/22/16 (previously reviewed by me):  Chronic elevation of right hemidiaphragm. No focal opacity appreciated. No pleural effusion or thickening. Implanted device within left anterior chest appreciated with leads projecting to the heart. Heart with cardiomegaly & mediastinum normal in contour.  CT CHEST W/ CONTRAST 03/04/15 (previously reviewed by me):  No central pulmonary emboli appreciated. Pulmonic trunk approximately 3.1 cm by my review. No pathologic mediastinal adenopathy. No pericardial effusion. No parenchymal nodule or opacity appreciated. Minimal atelectasis at the dome of the right hemidiaphragm from compression. Elevation of right hemidiaphragm again noted. Trace bilateral pleural effusions versus pleural thickening with some rounded atelectasis versus trapped pleural fluid in right lateral chest. No pericardial effusion. Radiology also noted hypodensities within the liver consistent with cysts.  CARDIAC TTE (10/13/16):  LV normal in size with moderate LVH. EF 60-65% with no regional wall motion abnormalities & grade 1 diastolic dysfunction. LA & RA normal in size. RV mildly dilated with mild reduction in systolic function. Pulmonary artery systolic pressure 23 mmHg. Trivial aortic regurgitation without stenosis. Aortic root normal in size. No mitral stenosis or  regurgitation. No pulmonic regurgitation. Trivial tricuspid regurgitation. No pericardial effusion.  LHC (10/22/15):  Dist RCA lesion, 90 %stenosed. RPDA lesion, 90 %stenosed. Rotational atherectomy was performed on both vessels with 1.5 and 1.75 burrs.  A STENT SYNERGY DES 2.75X28 drug eluting stent was successfully placed- covering both lesions, postdilated with a 3.5 balloon.  Post  intervention, there is a 0% residual stenosis.  LV end diastolic pressure is normal.  There is no aortic valve stenosis.  LHC (10/17/15):  Mid LAD lesion, 80 %stenosed, extending into the Ost 2nd Diag lesion, 90 %stenosed. The diagonal is larger than the continuation of the LAD.  Dist RCA lesion, 90 %stenosed.  RPDA lesion, 90 %stenosed. This appears to be instent restenosis. We do not have records of where his prior stents were placed.  THe RCA and PDA lesions are likely the culprit for his symptoms.  Ost Cx to Mid Cx lesion, 25 %stenosed.  The left ventricular systolic function is normal.  The left ventricular ejection fraction is 55-65% by visual estimate.  There is no aortic valve stenosis.  LABS 11/08/16 Alpha-1 antitrypsin: FM (178)  10/22/16 CBC: 7.4/10.4/33.2/220 BMP: 138/3.2/102/22/14/1.51/174/9.0 LFT: 3.6/7.1/0.7/71/30/19 BNP: 35.1    Assessment & Plan:  76 y.o. male with COPD and h/o coronary artery disease. Acute dyspnea and chest discomfort with possible ischemia or underlying arrhythmia triggered by his lung disease & exertion. Patient to transport to the ED at [facility]. Spoke with ED Provider to make him aware of my concern. Plan for admission/observation & cardiac evaluation.   1. Acute onset dyspnea & chest discomfort:  S/P ASA 324mg  chewable & sublingual NTG. Transferring to [facility] ED via EMS. 2. COPD:  Plan to readdress at next appointment. 3. Alpha-1 antitrypsin carrier:  Plan to readdress at next appointment. 4. OSA:  Plan to reassess at next  appointment. 5. Health maintenance: Plan to address at next appointment. 6. Follow-up:  Return to clinic in 2-4 weeks.   Sonia Baller Ashok Cordia, M.D. [facility] Pager:  [phone] After 7pm or if no response, call [phone] 11:24 AM 11/26/16

## 2016-11-25 NOTE — Telephone Encounter (Signed)
Spoke w/ wife, Stanton Kidney (DPR on file).  Advised to have pt start Diltiazem 180 mg once daily Wife is agreeable and will monitor pt after starting new medication. Rx sent to [facility] for 30 day supply w/ 1 refill. Refill for 90 day supply sent to [facility]. Wife thanks me for my help.

## 2016-11-25 NOTE — Telephone Encounter (Signed)
Order placed for new CPAP per Dr. Ashok Cordia result note

## 2016-11-25 NOTE — Telephone Encounter (Addendum)
Pt sent in transmission, per device.  [name], device clinic brings me report --   showed SVT:  11:26 am, duration 2:28  12:56 pm, duration 2:42    1:04 pm, duration 7:00    1:14 pm, duration 5:50    1:22 pm, duration 2:12    1:28 pm, duration 4:18    3:37 pm, duration 1:50 A couple of PVCs noted also   ** Will await Dr. Curt Bears recommendation/s.

## 2016-11-26 ENCOUNTER — Observation Stay (HOSPITAL_COMMUNITY)
Admission: EM | Admit: 2016-11-26 | Discharge: 2016-11-27 | Disposition: A | Discharge status: Home / Self Care | Payer: Medicare Other | Attending: Student in an Organized Health Care Education/Training Program | Admitting: Student in an Organized Health Care Education/Training Program

## 2016-11-26 ENCOUNTER — Emergency Department (HOSPITAL_COMMUNITY): Payer: Medicare Other

## 2016-11-26 ENCOUNTER — Other Ambulatory Visit: Payer: Self-pay | Admitting: Cardiology

## 2016-11-26 ENCOUNTER — Ambulatory Visit: Payer: Medicare Other | Admitting: Pulmonary Disease

## 2016-11-26 ENCOUNTER — Encounter (HOSPITAL_COMMUNITY): Payer: Self-pay | Admitting: Emergency Medicine

## 2016-11-26 ENCOUNTER — Other Ambulatory Visit: Payer: Self-pay

## 2016-11-26 DIAGNOSIS — Z7982 Long term (current) use of aspirin: Secondary | ICD-10-CM | POA: Diagnosis not present

## 2016-11-26 DIAGNOSIS — Z7901 Long term (current) use of anticoagulants: Secondary | ICD-10-CM | POA: Insufficient documentation

## 2016-11-26 DIAGNOSIS — I2510 Atherosclerotic heart disease of native coronary artery without angina pectoris: Secondary | ICD-10-CM | POA: Insufficient documentation

## 2016-11-26 DIAGNOSIS — Z950 Presence of cardiac pacemaker: Secondary | ICD-10-CM | POA: Diagnosis not present

## 2016-11-26 DIAGNOSIS — E119 Type 2 diabetes mellitus without complications: Secondary | ICD-10-CM | POA: Diagnosis not present

## 2016-11-26 DIAGNOSIS — Z148 Genetic carrier of other disease: Secondary | ICD-10-CM

## 2016-11-26 DIAGNOSIS — E7800 Pure hypercholesterolemia, unspecified: Secondary | ICD-10-CM | POA: Diagnosis not present

## 2016-11-26 DIAGNOSIS — I471 Supraventricular tachycardia: Principal | ICD-10-CM | POA: Insufficient documentation

## 2016-11-26 DIAGNOSIS — R079 Chest pain, unspecified: Secondary | ICD-10-CM | POA: Diagnosis not present

## 2016-11-26 DIAGNOSIS — N183 Chronic kidney disease, stage 3 (moderate): Secondary | ICD-10-CM | POA: Insufficient documentation

## 2016-11-26 DIAGNOSIS — J449 Chronic obstructive pulmonary disease, unspecified: Secondary | ICD-10-CM | POA: Insufficient documentation

## 2016-11-26 DIAGNOSIS — Z87891 Personal history of nicotine dependence: Secondary | ICD-10-CM | POA: Insufficient documentation

## 2016-11-26 DIAGNOSIS — I495 Sick sinus syndrome: Secondary | ICD-10-CM

## 2016-11-26 DIAGNOSIS — I5032 Chronic diastolic (congestive) heart failure: Secondary | ICD-10-CM | POA: Insufficient documentation

## 2016-11-26 DIAGNOSIS — I130 Hypertensive heart and chronic kidney disease with heart failure and stage 1 through stage 4 chronic kidney disease, or unspecified chronic kidney disease: Secondary | ICD-10-CM | POA: Diagnosis not present

## 2016-11-26 DIAGNOSIS — Z79899 Other long term (current) drug therapy: Secondary | ICD-10-CM | POA: Diagnosis not present

## 2016-11-26 DIAGNOSIS — I208 Other forms of angina pectoris: Secondary | ICD-10-CM | POA: Diagnosis not present

## 2016-11-26 DIAGNOSIS — E8801 Alpha-1-antitrypsin deficiency: Secondary | ICD-10-CM

## 2016-11-26 DIAGNOSIS — I442 Atrioventricular block, complete: Secondary | ICD-10-CM | POA: Diagnosis not present

## 2016-11-26 HISTORY — DX: Headache: R51

## 2016-11-26 HISTORY — DX: Hypothyroidism, unspecified: E03.9

## 2016-11-26 HISTORY — DX: Headache, unspecified: R51.9

## 2016-11-26 HISTORY — DX: Gastro-esophageal reflux disease without esophagitis: K21.9

## 2016-11-26 HISTORY — DX: Depression, unspecified: F32.A

## 2016-11-26 HISTORY — DX: Major depressive disorder, single episode, unspecified: F32.9

## 2016-11-26 HISTORY — DX: Cerebral infarction, unspecified: I63.9

## 2016-11-26 LAB — CBC
HCT: 30.6 % — ABNORMAL LOW (ref 39.0–52.0)
Hemoglobin: 9.5 g/dL — ABNORMAL LOW (ref 13.0–17.0)
MCH: 25.5 pg — AB (ref 26.0–34.0)
MCHC: 31 g/dL (ref 30.0–36.0)
MCV: 82.3 fL (ref 78.0–100.0)
PLATELETS: 258 10*3/uL (ref 150–400)
RBC: 3.72 MIL/uL — ABNORMAL LOW (ref 4.22–5.81)
RDW: 15.5 % (ref 11.5–15.5)
WBC: 13.5 10*3/uL — ABNORMAL HIGH (ref 4.0–10.5)

## 2016-11-26 LAB — BASIC METABOLIC PANEL
ANION GAP: 10 (ref 5–15)
BUN: 22 mg/dL — AB (ref 6–20)
CALCIUM: 9.2 mg/dL (ref 8.9–10.3)
CO2: 22 mmol/L (ref 22–32)
CREATININE: 1.78 mg/dL — AB (ref 0.61–1.24)
Chloride: 103 mmol/L (ref 101–111)
GFR calc Af Amer: 40 mL/min — ABNORMAL LOW (ref >60)
GFR, EST NON AFRICAN AMERICAN: 35 mL/min — AB (ref >60)
GLUCOSE: 380 mg/dL — AB (ref 65–99)
Potassium: 3.9 mmol/L (ref 3.5–5.1)
Sodium: 135 mmol/L (ref 135–145)

## 2016-11-26 LAB — GLUCOSE, CAPILLARY
GLUCOSE-CAPILLARY: 205 mg/dL — AB (ref 65–99)
GLUCOSE-CAPILLARY: 276 mg/dL — AB (ref 65–99)

## 2016-11-26 LAB — BRAIN NATRIURETIC PEPTIDE: B Natriuretic Peptide: 105.3 pg/mL — ABNORMAL HIGH (ref 0.0–100.0)

## 2016-11-26 LAB — TROPONIN I
Troponin I: <0.03 ng/mL (ref <0.03)
Troponin I: <0.03 ng/mL (ref <0.03)

## 2016-11-26 LAB — I-STAT TROPONIN, ED: TROPONIN I, POC: 0 ng/mL (ref 0.00–0.08)

## 2016-11-26 LAB — MAGNESIUM: Magnesium: 1.8 mg/dL (ref 1.7–2.4)

## 2016-11-26 MED ORDER — LEVOTHYROXINE SODIUM 25 MCG PO TABS
25.0000 ug | ORAL_TABLET | Freq: Every day | ORAL | Status: DC
  Administered 2016-11-27: 25 ug via ORAL
  Filled 2016-11-26: qty 1

## 2016-11-26 MED ORDER — ASPIRIN EC 81 MG PO TBEC
81.0000 mg | DELAYED_RELEASE_TABLET | Freq: Every day | ORAL | Status: DC
  Administered 2016-11-27: 81 mg via ORAL
  Filled 2016-11-26: qty 1

## 2016-11-26 MED ORDER — DILTIAZEM HCL ER COATED BEADS 180 MG PO CP24
180.0000 mg | ORAL_CAPSULE | Freq: Every day | ORAL | Status: DC
  Administered 2016-11-26: 180 mg via ORAL
  Filled 2016-11-26: qty 1

## 2016-11-26 MED ORDER — ASPIRIN 81 MG PO CHEW
324.0000 mg | CHEWABLE_TABLET | Freq: Once | ORAL | Status: DC
  Administered 2016-11-26: 324 mg via ORAL

## 2016-11-26 MED ORDER — ESCITALOPRAM OXALATE 10 MG PO TABS
10.0000 mg | ORAL_TABLET | Freq: Every day | ORAL | Status: DC
  Administered 2016-11-26: 10 mg via ORAL
  Filled 2016-11-26: qty 1

## 2016-11-26 MED ORDER — ACETAMINOPHEN 325 MG PO TABS: 650.0000 mg | ORAL_TABLET | Freq: Four times a day (QID) | ORAL | Status: DC | PRN

## 2016-11-26 MED ORDER — HEPARIN SODIUM (PORCINE) 5000 UNIT/ML IJ SOLN
5000.0000 [IU] | Freq: Three times a day (TID) | INTRAMUSCULAR | Status: DC
  Administered 2016-11-26 – 2016-11-27 (×2): 5000 [IU] via SUBCUTANEOUS
  Filled 2016-11-26 (×2): qty 1

## 2016-11-26 MED ORDER — ACETAMINOPHEN 500 MG PO TABS: 500.0000 mg | ORAL_TABLET | Freq: Four times a day (QID) | ORAL | Status: DC | PRN

## 2016-11-26 MED ORDER — PROMETHAZINE HCL 25 MG PO TABS: 12.5000 mg | ORAL_TABLET | Freq: Four times a day (QID) | ORAL | Status: DC | PRN

## 2016-11-26 MED ORDER — INSULIN ASPART 100 UNIT/ML ~~LOC~~ SOLN
0.0000 [IU] | SUBCUTANEOUS | Status: DC
  Administered 2016-11-26: 5 [IU] via SUBCUTANEOUS
  Administered 2016-11-27 (×2): 3 [IU] via SUBCUTANEOUS
  Administered 2016-11-27: 2 [IU] via SUBCUTANEOUS

## 2016-11-26 MED ORDER — SENNOSIDES-DOCUSATE SODIUM 8.6-50 MG PO TABS: 1.0000 | ORAL_TABLET | Freq: Every evening | ORAL | Status: DC | PRN

## 2016-11-26 MED ORDER — FLUTICASONE FUROATE-VILANTEROL 200-25 MCG/INH IN AEPB
1.0000 | INHALATION_SPRAY | Freq: Every day | RESPIRATORY_TRACT | Status: DC
  Administered 2016-11-27: 1 via RESPIRATORY_TRACT
  Filled 2016-11-26: qty 28

## 2016-11-26 MED ORDER — FLUTICASONE FUROATE-VILANTEROL 200-25 MCG/INH IN AEPB
1.0000 | INHALATION_SPRAY | Freq: Every day | RESPIRATORY_TRACT | Status: DC
  Filled 2016-11-26: qty 28

## 2016-11-26 MED ORDER — NITROGLYCERIN 0.4 MG SL SUBL: 0.4000 mg | SUBLINGUAL_TABLET | Freq: Every day | SUBLINGUAL | Status: DC | PRN

## 2016-11-26 MED ORDER — ACETAMINOPHEN 650 MG RE SUPP: 650.0000 mg | Freq: Four times a day (QID) | RECTAL | Status: DC | PRN

## 2016-11-26 MED ORDER — ALBUTEROL SULFATE (2.5 MG/3ML) 0.083% IN NEBU: 3.0000 mL | INHALATION_SOLUTION | RESPIRATORY_TRACT | Status: DC | PRN

## 2016-11-26 MED ORDER — SODIUM CHLORIDE 0.9% FLUSH
3.0000 mL | Freq: Two times a day (BID) | INTRAVENOUS | Status: DC
  Administered 2016-11-26: 3 mL via INTRAVENOUS

## 2016-11-26 MED ORDER — CLOPIDOGREL BISULFATE 75 MG PO TABS
75.0000 mg | ORAL_TABLET | Freq: Every day | ORAL | Status: DC
  Administered 2016-11-26: 75 mg via ORAL
  Filled 2016-11-26: qty 1

## 2016-11-26 MED ORDER — METOPROLOL SUCCINATE ER 25 MG PO TB24
25.0000 mg | ORAL_TABLET | Freq: Every day | ORAL | Status: DC
  Administered 2016-11-27: 25 mg via ORAL
  Filled 2016-11-26: qty 1

## 2016-11-26 NOTE — ED Notes (Signed)
Report attempted 

## 2016-11-26 NOTE — ED Provider Notes (Signed)
[facility] EMERGENCY DEPARTMENT Provider Note   CSN: [id number] Arrival date & time: 11/26/16  1143     History   Chief Complaint Chief Complaint  Patient presents with  . Chest Pain    HPI [name] is a 76 y.o. male.  HPI   76 year old male with past medical history as below who presents with increasing palpitations and shortness of breath.  The patient was recently admitted in early October for similar symptoms.  He had an echo which showed diastolic dysfunction but was otherwise unremarkable.  Since then, he reports increasingly frequent and severe episodes in which she feels acute onset of shortness of breath followed by dull, aching, substernal chest pain.  These episodes usually occur while he is exerting himself, but also occur at rest.  He was seeing Dr. Ashok Cordia, his pulmonologist, at clinic today when he experienced acute onset of these symptoms.  He had a dull, severe chest pain associated with this.  The symptoms have now improved.  EKG showed sinus rhythm.  He was sent to the ED for further evaluation.  Denies any ongoing pain.  No recent chills.  No recent medication changes.  Denies any known history of arrhythmia.  Past Medical History:  Diagnosis Date  . Anxiety   . Arthritis    "knees, back, shoulders" (10/22/2015)  . CAD (coronary artery disease)    a. remote stenting >20 years ago. b. PTCA unknown vessel ~2013. c. 10/2015: s/p rotational atherectomy/DES to distal RCA and RPDA c/b embolic CVA  . CHB (complete heart block) ([place]) 02/2015   [name] 03/03/2015  . Chronic diastolic CHF (congestive heart failure) ([place])   . Chronic shoulder pain   . [name] (chronic kidney disease), stage III ([place])   . COPD (chronic obstructive pulmonary disease) ([place])   . Dilated aortic root ([place])    a. mild by echo 09/2015.  Marland Kitchen Heart attack [facility]) 'many years ago'  . High cholesterol   . Hypertension   . OSA (obstructive sleep apnea)    "suppose to have a mask;  they haven't got one adjusted for him yet" (10/22/2015)  . Presence of permanent cardiac pacemaker   . Sinus arrest   . Skin cancer of face    "had it cut off"  . Statin intolerance   . Symptomatic bradycardia    a. syncope/sinus arrest and bradycardia s/p [employer] PPM 02/2015 with lead revision 03/2015.    Patient Active Problem List   Diagnosis Date Noted  . SVT (supraventricular tachycardia) ([place]) 11/26/2016  . Alpha-1-antitrypsin deficiency carrier ([place]) 11/25/2016  . Restrictive lung disease 11/08/2016  . COPD (chronic obstructive pulmonary disease) ([place]) 10/25/2016  . Shortness of breath 10/12/2016  . Anemia, macrocytic 12/24/2015  . Frail elderly 11/14/2015  . History of heart artery stent 11/14/2015  . [name] stage G3a/A1, GFR 45-59 and albumin creatinine ratio <30 mg/g ([place]) 11/05/2015  . Facial weakness   . S/P PTCA (percutaneous transluminal coronary angioplasty)   . Hyperlipidemia   . Essential hypertension   . S/P [facility] Feb 2017 10/23/2015  . CAD-S/P remote PCI 20 yrs ago, ? 4 years ago, and s/p RCA PCI/DES 10/22/15 10/23/2015  . Cerebral thrombosis with cerebral infarction 10/23/2015  . DOE (dyspnea on exertion)   . Accelerating angina ([place])   . Hypothyroidism 07/09/2015  . Fatigue due to depression 05/09/2015  . Moderate single current episode of major depressive disorder ([place]) 05/09/2015  . 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  Syncope 04/10/2015  . Faintness   . Retinal detachment, tractional, right eye 03/15/2015  . Hepatic cyst 03/11/2015  . Pacemaker 03/11/2015  . Abnormal electrocardiogram 03/10/2015  . Contracture of ankle and foot joint 03/10/2015  . Drug therapy 03/10/2015  . Dysphagia 03/10/2015  . Edema 03/10/2015  . History of ST elevation myocardial infarction (STEMI) 03/10/2015  . Low back pain 03/10/2015  . Mild cognitive impairment 03/10/2015  . Mediastinal lymphadenopathy 03/10/2015  . Obesity 03/10/2015  .  Osteoarthritis 03/10/2015  . 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 Personal history of noncompliance with medical treatment, presenting hazards to health 03/10/2015  . 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 Symptomatic bradycardia 03/04/2015  . History of complete heart block 03/03/2015  . Complete atrioventricular block ([place]) 03/03/2015  . Abnormal chest x-ray 02/19/2015  . Abnormal TSH 02/19/2015  . Anxiety about health 02/19/2015  . CAD in native artery 02/19/2015  . Diastolic CHF ([place]) 48/54/6270  . 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 LVH (left ventricular hypertrophy) 02/19/2015  . Medical non-compliance 02/19/2015  . OSA (obstructive sleep apnea) 02/19/2015  . Prediabetes 02/19/2015  . Rotator cuff tear, right 02/19/2015  . Abnormal computed tomography scan 02/10/2015  . Abnormal CT of the chest 02/10/2015  . Coronary artery disease involving native coronary artery of native heart 01/23/2015  . Non-Q wave myocardial infarction ([place]) 09/09/2011    Past Surgical History:  Procedure Laterality Date  . CATARACT EXTRACTION W/ INTRAOCULAR LENS  IMPLANT, BILATERAL Bilateral 2016  . CORONARY ANGIOPLASTY  2012  . CORONARY ANGIOPLASTY WITH STENT PLACEMENT  2001  . CORONARY ANGIOPLASTY WITH STENT PLACEMENT  10/22/2015   "took 2 out and put 1 longer one in"  . Coronary Stent Intervention Rotablater N/A 10/22/2015   Performed by Jettie Booze, MD at [facility]  . EYE SURGERY    . INSERT / REPLACE / REMOVE PACEMAKER    . Left Heart Cath and Coronary Angiography N/A 10/22/2015   Performed by Jettie Booze, MD at [facility]  . Left Heart Cath and Coronary Angiography N/A 10/17/2015   Performed by Jettie Booze, MD at [facility]  . Pacemaker Implant N/A 03/04/2015   Performed by Constance Haw, MD at [facility]  . PPM Lead Revision/Repair N/A 04/10/2015   Performed by Constance Haw, MD at [facility]  [facility]  . RETINAL DETACHMENT SURGERY Bilateral    "several on each side"       Home Medications    Prior to Admission medications   Medication Sig Start Date End Date Taking? Authorizing Provider  acetaminophen (TYLENOL) 500 MG tablet Take 500 mg by mouth every 6 (six) hours as needed for headache.   Yes [provider]  albuterol (PROAIR HFA) 108 (90 Base) MCG/ACT inhaler Inhale 1-2 puffs into the lungs every 4 (four) hours as needed for shortness of breath.   Yes [provider]  albuterol (PROVENTIL) (2.5 MG/3ML) 0.083% nebulizer solution Take 2.5 mg by nebulization every 6 (six) hours as needed for wheezing or shortness of breath.   Yes [provider]  aspirin EC 81 MG tablet Take 81 mg by mouth at bedtime.    Yes [provider]  clopidogrel (PLAVIX) 75 MG tablet Take 1 tablet (75 mg total) by mouth daily. Patient taking differently: Take 75 mg by mouth at bedtime.  10/20/15  Yes Arbutus Leas, NP  Cyanocobalamin (VITAMIN B-12 PO) Place  1 mL under the tongue daily.   Yes [provider]  diltiazem (CARDIZEM CD) 180 MG 24 hr capsule Take 1 capsule (180 mg total) daily by mouth. Patient taking differently: Take 180 mg at bedtime by mouth.  11/25/16  Yes Camnitz, Will Hassell Done, MD  escitalopram (LEXAPRO) 10 MG tablet Take 10 mg by mouth at bedtime.    Yes [provider]  Glycopyrrolate-Formoterol (BEVESPI AEROSPHERE) 9-4.8 MCG/ACT AERO Inhale 2 puffs into the lungs 2 (two) times daily. 11/08/16  Yes Javier Glazier, MD  isosorbide mononitrate (IMDUR) 30 MG 24 hr tablet Take 1 tablet (30 mg total) by mouth daily. 10/17/15  Yes Jettie Booze, MD  levothyroxine (SYNTHROID, LEVOTHROID) 25 MCG tablet Take 25 mcg by mouth daily before breakfast.   Yes [provider]  metoprolol succinate (TOPROL-XL) 25 MG 24 hr tablet Take 1 tablet (25 mg total) by mouth daily. Take with or immediately following a meal. 07/26/16  Yes Camnitz,  Ocie Doyne, MD  Multiple Vitamin (MULTIVITAMIN WITH MINERALS) TABS tablet Take 1 tablet by mouth daily.    Yes [provider]  nitroGLYCERIN (NITROSTAT) 0.4 MG SL tablet Place 0.4 mg under the tongue daily as needed for chest pain.    Yes [provider]  ramipril (ALTACE) 2.5 MG capsule Take 2.5 mg by mouth at bedtime.    Yes [provider]  torsemide (DEMADEX) 20 MG tablet Take 20 mg by mouth daily.  03/03/16  Yes [provider]  Glycopyrrolate-Formoterol (BEVESPI AEROSPHERE) 9-4.8 MCG/ACT AERO Inhale 2 puffs into the lungs 2 (two) times daily. Patient not taking: Reported on 11/26/2016 11/08/16   Javier Glazier, MD  Spacer/Aero Chamber Mouthpiece MISC 1 Device by Does not apply route as directed. 11/08/16   Javier Glazier, MD    Family History Family History  Problem Relation Age of Onset  . Heart disease Father   . Heart disease Mother   . Diabetes Sister   . Cancer Sister   . Diabetes Brother   . Lung disease Neg Hx     Social History Social History   Tobacco Use  . Smoking status: Former Smoker    Packs/day: 1.00    Years: 11.00    Pack years: 11.00    Types: Cigarettes    Start date: 09/09/1947    Last attempt to quit: 09/09/1958    Years since quitting: 58.2  . Smokeless tobacco: Never Used  . Tobacco comment: Significant second-hand exposure. Wife also smokes around him.  Substance Use Topics  . Alcohol use: Yes    Comment: 10/22/2015 "might have 1 glass of wine/month, if that  . Drug use: No     Allergies   Novocain [procaine]; Rosuvastatin calcium; and Statins   Review of Systems Review of Systems  Constitutional: Positive for fatigue.  Respiratory: Positive for cough, chest tightness and shortness of breath.   Cardiovascular: Positive for chest pain.  All other systems reviewed and are negative.    Physical Exam Updated Vital Signs BP 121/83   Pulse (!) 59   Temp 97.7 F (36.5 C) (Oral)   Resp 18    SpO2 97%   Physical Exam  Constitutional: He is oriented to person, place, and time. He appears well-developed and well-nourished. No distress.  HENT:  Head: Normocephalic and atraumatic.  Eyes: Conjunctivae are normal.  Neck: Neck supple.  Cardiovascular: Normal rate, regular rhythm and normal heart sounds. Exam reveals no friction rub.  No murmur heard. Pulmonary/Chest:  Effort normal and breath sounds normal. No respiratory distress. He has no wheezes. He has no rales.  Abdominal: He exhibits no distension.  Musculoskeletal: He exhibits no edema.  Neurological: He is alert and oriented to person, place, and time. He exhibits normal muscle tone.  Skin: Skin is warm. Capillary refill takes less than 2 seconds.  Psychiatric: He has a normal mood and affect.  Nursing note and vitals reviewed.    ED Treatments / Results  Labs (all labs ordered are listed, but only abnormal results are displayed) Labs Reviewed  BASIC METABOLIC PANEL - Abnormal; Notable for the following components:      Result Value   Glucose, Bld 380 (*)    BUN 22 (*)    Creatinine, Ser 1.78 (*)    GFR calc non Af Amer 35 (*)    GFR calc Af Amer 40 (*)    All other components within normal limits  CBC - Abnormal; Notable for the following components:   WBC 13.5 (*)    RBC 3.72 (*)    Hemoglobin 9.5 (*)    HCT 30.6 (*)    MCH 25.5 (*)    All other components within normal limits  BRAIN NATRIURETIC PEPTIDE - Abnormal; Notable for the following components:   B Natriuretic Peptide 105.3 (*)    All other components within normal limits  MAGNESIUM  TROPONIN I  TROPONIN I  TROPONIN I  I-STAT TROPONIN, ED    EKG  EKG Interpretation None       Radiology Dg Chest 2 View  Result Date: 11/26/2016 CLINICAL DATA:  Shortness of breath since last night. EXAM: CHEST  2 VIEW COMPARISON:  10/22/2016. FINDINGS: Stable enlarged cardiac silhouette and tortuous aorta. Clear lungs with normal vascularity. Stable  large right diaphragmatic eventration. Stable left subclavian pacemaker leads. IMPRESSION: No acute abnormality. Electronically Signed   By: Claudie Revering M.D.   On: 11/26/2016 13:34    Procedures Procedures (including critical care time)  Medications Ordered in ED Medications  albuterol (PROVENTIL HFA;VENTOLIN HFA) 108 (90 Base) MCG/ACT inhaler 1-2 puff (not administered)  acetaminophen (TYLENOL) tablet 500 mg (not administered)  escitalopram (LEXAPRO) tablet 10 mg (not administered)  levothyroxine (SYNTHROID, LEVOTHROID) tablet 25 mcg (not administered)  aspirin EC tablet 81 mg (not administered)     Initial Impression / Assessment and Plan / ED Course  I have reviewed the triage vital signs and the nursing notes.  Pertinent labs & imaging results that were available during my care of the patient were reviewed by me and considered in my medical decision making (see chart for details).     76 year old male here with progressively worsening dyspnea with exertion.  Patient was just admitted last month for similar symptoms.  However, he now has increasingly frequent episodes of SVT on pacemaker interrogation.  I suspect he is going intermittently into atrial fibrillation or other SVT with subsequent demand ischemia.  I discussed the case with Dr. Alfonse Spruce of cardiology.  He recommends admission to medicine and consultation with EP team in the morning.  Final Clinical Impressions(s) / ED Diagnoses   Final diagnoses:  SVT (supraventricular tachycardia) [facility])    ED Discharge Orders    None       Duffy Bruce, MD 11/26/16 1740

## 2016-11-26 NOTE — Progress Notes (Signed)
Pt arrived for PFT and was called back to begin PFT. [name] arrived to room in and he was complaining of chest pain and SOB. Wife had already given him 1 dose of nitroglycerin around 1100. Pt brought back to an empty patient room. Vitals taken at 1109. EMS was called.   BP 138/78, Pulse 85, O2 95% on 2L O2 Patient was using accessory muscles for breathing.   Aspirin 324mg  chewable given to patient at 1109 per Dr. Ashok Cordia. Dr. Ashok Cordia ordered a 12 lead EKG and it was obtained.   EMS arrived and report given to paramedics. Family made aware of plan and pt was transported to [facility].

## 2016-11-26 NOTE — Addendum Note (Signed)
Addended by: Rocky Morel D on: 11/26/2016 03:24 PM   Modules accepted: Orders

## 2016-11-26 NOTE — H&P (Signed)
Date: 11/26/2016               Patient Name:  [name] MRN: [id number]  DOB: 15-Aug-1940 Age / Sex: 76 y.o., male   PCP: Maris Berger, MD         Medical Service: Internal Medicine Teaching Service         Attending Physician: Dr. Evette Doffing, Mallie Mussel, *    First Contact: Dr. Maricela Bo Pager: [phone]  Second Contact: Dr. Tiburcio Pea Pager: [phone]       After Hours (After 5p/  First Contact Pager: [phone]  weekends / holidays): Second Contact Pager: [phone]   Chief Complaint: Shortness of Breath  History of Present Illness: [name] is a 76 y.o m with pmh of complete heart block with pacemaker, CAD s/p stenting, COPD, [name], CVA, chronic diastolic heart failure who presented from [facility] pulmonary clinic with shortness of breath and chest pain. The patient had anterior chest pain that he states was 1/10 in intensity, dull in nature, non-radiating, and lasting 10-15 minutes. The chest pain was accompanied with shortness of breath and weakness. He was given nitroglycerin and 4 aspirin tablets for the chest pain. He denies any nausea/vomiting, dizziness, or sweating.   The patient has been having shortness of breath both at rest and with exertion for the past year that has been worsening and occurring more frequently few times per day. The patient has been having chest pain every 2-3 days. The patient's wife who is a previous CNA  got access to an oxygen tank and has been giving the patient oxygen to relieve his shortness of breath. The patient has found relief with the oxygen therapy. The patient has also been using his wife's nebulizer for breathing treatments.   The patient has been very tired recently and takes naps during the day. He is not compliant with using CPAP mask at night as it does not fit well.  The patient was admitted on 10/12/16 with similar symptoms. He was worked up with imaging and echo and thought to have pulmonary hypertension in the setting of chronic OSA.  The patient returned to ED on 10/22/16 with symptoms of shortness of breath and he was given solumedrol and a prednisone course. At the patient's pulmonary visit this morning 11/26/16 he was to do a repeat pft but could not complete due to acute dyspnea and chest discomfort. He was recommended to continue bevespi.  ED Course: chest pain had resolved. EKG ordered, pacemaker interrogated, cardiology consulted  Meds:  Current Meds  Medication Sig  . acetaminophen (TYLENOL) 500 MG tablet Take 500 mg by mouth every 6 (six) hours as needed for headache.  . albuterol (PROAIR HFA) 108 (90 Base) MCG/ACT inhaler Inhale 1-2 puffs into the lungs every 4 (four) hours as needed for shortness of breath.  Marland Kitchen albuterol (PROVENTIL) (2.5 MG/3ML) 0.083% nebulizer solution Take 2.5 mg by nebulization every 6 (six) hours as needed for wheezing or shortness of breath.  Marland Kitchen aspirin EC 81 MG tablet Take 81 mg by mouth at bedtime.   . clopidogrel (PLAVIX) 75 MG tablet Take 1 tablet (75 mg total) by mouth daily. (Patient taking differently: Take 75 mg by mouth at bedtime. )  . Cyanocobalamin (VITAMIN B-12 PO) Place 1 mL under the tongue daily.  Marland Kitchen diltiazem (CARDIZEM CD) 180 MG 24 hr capsule Take 1 capsule (180 mg total) daily by mouth. (Patient taking differently: Take 180 mg at bedtime by mouth. )  . escitalopram (LEXAPRO)  10 MG tablet Take 10 mg by mouth at bedtime.   . Glycopyrrolate-Formoterol (BEVESPI AEROSPHERE) 9-4.8 MCG/ACT AERO Inhale 2 puffs into the lungs 2 (two) times daily.  . isosorbide mononitrate (IMDUR) 30 MG 24 hr tablet Take 1 tablet (30 mg total) by mouth daily.  Marland Kitchen levothyroxine (SYNTHROID, LEVOTHROID) 25 MCG tablet Take 25 mcg by mouth daily before breakfast.  . metoprolol succinate (TOPROL-XL) 25 MG 24 hr tablet Take 1 tablet (25 mg total) by mouth daily. Take with or immediately following a meal.  . Multiple Vitamin (MULTIVITAMIN WITH MINERALS) TABS tablet Take 1 tablet by mouth daily.   . nitroGLYCERIN  (NITROSTAT) 0.4 MG SL tablet Place 0.4 mg under the tongue daily as needed for chest pain.   . ramipril (ALTACE) 2.5 MG capsule Take 2.5 mg by mouth at bedtime.   . torsemide (DEMADEX) 20 MG tablet Take 20 mg by mouth daily.      Allergies: Allergies as of 11/26/2016 - Review Complete 11/26/2016  Allergen Reaction Noted  . Novocain [procaine] Other (See Comments) 03/04/2015  . Rosuvastatin calcium Other (See Comments) 03/14/2015  . Statins Other (See Comments) 03/04/2015   Past Medical History:  Diagnosis Date  . Anxiety   . Arthritis    "knees, back, shoulders" (10/22/2015)  . CAD (coronary artery disease)    a. remote stenting >20 years ago. b. PTCA unknown vessel ~2013. c. 10/2015: s/p rotational atherectomy/DES to distal RCA and RPDA c/b embolic CVA  . CHB (complete heart block) ([place]) 02/2015   [name] 03/03/2015  . Chronic diastolic CHF (congestive heart failure) ([place])   . Chronic shoulder pain   . [name] (chronic kidney disease), stage III ([place])   . COPD (chronic obstructive pulmonary disease) ([place])   . Dilated aortic root ([place])    a. mild by echo 09/2015.  Marland Kitchen Heart attack [facility]) 'many years ago'  . High cholesterol   . Hypertension   . OSA (obstructive sleep apnea)    "suppose to have a mask; they haven't got one adjusted for him yet" (10/22/2015)  . Presence of permanent cardiac pacemaker   . Sinus arrest   . Skin cancer of face    "had it cut off"  . Statin intolerance   . Symptomatic bradycardia    a. syncope/sinus arrest and bradycardia s/p St. Jude PPM 02/2015 with lead revision 03/2015.    Family History:   Family History  Problem Relation Age of Onset  . Heart disease Father   . Heart disease Mother   . Diabetes Sister   . Cancer Sister   . Diabetes Brother   . Lung disease Neg Hx    Social History:   Social History   Socioeconomic History  . Marital status: Married    Spouse name: None  . Number of children: None  . Years of education: None  .  Highest education level: None  Social Needs  . Financial resource strain: None  . Food insecurity - worry: None  . Food insecurity - inability: None  . Transportation needs - medical: None  . Transportation needs - non-medical: None  Occupational History  . None  Tobacco Use  . Smoking status: Former Smoker    Packs/day: 1.00    Years: 11.00    Pack years: 11.00    Types: Cigarettes    Start date: 09/09/1947    Last attempt to quit: 09/09/1958    Years since quitting: 58.2  . Smokeless tobacco: Never Used  . Tobacco comment:  Significant second-hand exposure. Wife also smokes around him.  Substance and Sexual Activity  . Alcohol use: Yes    Comment: 10/22/2015 "might have 1 glass of wine/month, if that  . Drug use: No  . Sexual activity: Yes  Other Topics Concern  . None  Social History Narrative   [facility] (10/25/16):   Originally from [place]. Has lived in [place] most of his life. He was a Company secretary. No pets currently. No bird exposure. No mold exposure. Previously like going camping. 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 Significant exposure to dust, etc through his work. No known asbestos exposure.     Review of Systems: A complete ROS was negative except as per HPI.   Physical Exam: Blood pressure 121/83, pulse (!) 59, temperature 97.7 F (36.5 C), temperature source Oral, resp. rate 18, SpO2 97 %.  Physical Exam  Constitutional: He appears well-developed and well-nourished. He does not appear ill.  HENT:  Head: Normocephalic and atraumatic.  Cardiovascular: Normal rate, regular rhythm, intact distal pulses and normal pulses.  Pulmonary/Chest: Effort normal and breath sounds normal. No accessory muscle usage. No respiratory distress.  Abdominal: Soft. Bowel sounds are normal. He exhibits no distension. There is no tenderness.  Musculoskeletal:       Right lower leg: He exhibits no edema.  Neurological: He is alert.  Skin: No erythema. No  pallor.  Psychiatric: He has a normal mood and affect. His behavior is normal. His mood appears not anxious. He is not agitated.   CBC    Component Value Date/Time   WBC 13.5 (H) 11/26/2016 1200   RBC 3.72 (L) 11/26/2016 1200   HGB 9.5 (L) 11/26/2016 1200   HCT 30.6 (L) 11/26/2016 1200   PLT 258 11/26/2016 1200   MCV 82.3 11/26/2016 1200   MCH 25.5 (L) 11/26/2016 1200   MCHC 31.0 11/26/2016 1200   RDW 15.5 11/26/2016 1200   LYMPHSABS 1.8 10/22/2016 1805   MONOABS 0.3 10/22/2016 1805   EOSABS 0.2 10/22/2016 1805   BASOSABS 0.0 10/22/2016 1805   BMP Latest Ref Rng & Units 11/26/2016 10/22/2016 10/12/2016  Glucose 65 - 99 mg/dL 380(H) 174(H) -  BUN 6 - 20 mg/dL 22(H) 14 -  Creatinine 0.61 - 1.24 mg/dL 1.78(H) 1.51(H) 1.51(H)  Sodium 135 - 145 mmol/L 135 138 -  Potassium 3.5 - 5.1 mmol/L 3.9 3.2(L) -  Chloride 101 - 111 mmol/L 103 102 -  CO2 22 - 32 mmol/L 22 22 -  Calcium 8.9 - 10.3 mg/dL 9.2 9.0 -    EKG: sinus rhythm with left axis deviation  CXR: No acute abnormality  Echo (10/13/16): EF=60-65%, moderate LVH, no regional wall motion abnormalities, grade 1 diastolic dysfunction, mildly calcified annulus. PA peak pressure=23.  PFT (11/02/16): FVC 2.79 L (60%) FEV1 2.22 L (66%) FEV1/FVC 0.80 FEF 25-75 2.23 L (94%) positive bronchodilator response TLC 3.3 L (50%) RV 49% ERV 19% DLCO corrected 42%  Assessment & Plan by Problem:  76 y.o m with pmh of complete heart block with pacemaker, CAD s/p stenting, COPD, [name], CVA, chronic diastolic heart failure who presented with sob and chest pain.  Shortness of Breath SVT (supraventricular tachycardia) (HCC) The patient has been having recurring chest pain accompanied with shortness of breath for the past year. The patient's I-stat troponin was negative and there were no st or t wave changes on ekg to indicate acs. The patient's chest x-ray did not show any indication of pneumonia, fluid overload, or  pulmonary edema. The patient scores 0  on wells criteria and he was not hypoxic nor tachycardic.  BNP=105, but the patient was euvolemic on exam (no jvp, no crackles, no pitting edema). The patient's most recent echo was 10/13/16 which showed preserved ejection function and grade 1 diastolic dysfunction. The patient's weight during this visit is 221lbs and he was 231lbs at previous ed visit on 10/22/16. Pacemaker interrogation reveals frequent episodes of SVT.   The patient's sob is likely to be multifactorial due to his COPD, restrictive lung disease, OSA, diastolic heart failure, and possibly a component of pulmonary hypertension as well.   -Trend troponin: <0.03, <0.03 -BNP=105.3 -Will consider ABG -Right heart catheterization would be appropriate, but will not do due to the patient's age. -Will not diurese as dehydration can further increase the patient's heart rate and promote svts -Diltiazem 180mg  qhs to be continued for the svts -electrophysiology consulted and on board  COPD Restrictive lung disease: The patient has been complaining of chronic shortness of breath which he claims is relieved with self treatment of oxygen and his wife's nebulizer treatment. Chest x-ray did not show any acute abnormalities. The patient is saturating in the 90s on room air. The patient had pulmonary funciton tests done on 11/02/16 which showed a restrictive pattern of disease. FEV1/FVC pre=80%, FEV1/FVC post=86%, DLCO cor 42%. The patient's copd combined with restrictive pattern of disease are likely contributing to the patient's shortness of breath. 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   -Fluticasone-vilanterol 200-55mcg/inh qd  OSA The patient has had a sleep study that was done 11/24/16 and was recommended to do a trial of cpap therapy. The patient's wife states that the cpap mask does not fit the patient's face well and he cannot tolerate nasal cpap. I do however feel that it is important for the patient to remain compliant to the cpap as he is  becoming symptomatic with daytime tiredness and shortness of breath. This can also lead to higher incidence of arrhythmias (atrial fibrillation).   -continue cpap  Hypertension The patient's blood pressure since admission has ranged 107-127/79-97. The patient's blood pressure is normotensive so will hold off any anti-hypertensive agents for now.  Diabetes Mellitus The patient's blood glucose during this admission has been elevated at 380 -SSI -CBG monitoring  Dispo: Admit patient to Inpatient with expected length of stay greater than 2 midnights.  Signed: Lars Mage, MD Internal Medicine PGY1 [phone] 11/26/2016, 6:00 PM

## 2016-11-26 NOTE — ED Triage Notes (Signed)
Patient from [facility] for routine visit. While walking in to office patient started experiencing shortness of breath and chest pain.  Received 324mg  aspirin and 1 SL nitro from [place], chest pain resolved to 0/10 but shortness of breath persists.  Patient states she does not remember having any pain but wife at bedside states when walking in to office patient grabbed his chest and said he felt a heaviness in the middle of his chest.  Patient alert but disoriented at this time. Wife states patient is often confused.

## 2016-11-26 NOTE — ED Notes (Signed)
Unable to assess pacemaker with Merlin device, rep [employer] paged.

## 2016-11-26 NOTE — Patient Instructions (Signed)
Transferring to ED

## 2016-11-26 NOTE — Consult Note (Signed)
Cardiology Consultation:   Patient ID: [name]; [id number]; [birth year]   Admit date: 11/26/2016 Date of Consult: 11/26/2016  Primary Care Provider: Maris Berger, MD Primary Cardiologist: Dr. Irish Lack Primary Electrophysiologist:  Dr. Curt Bears   Patient Profile:   [name] is a 76 y.o. 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  History of Present Illness:   [name] was referred to the ER from Dr. Creig Hines office where he had acute onset of SOB, CP/pressure walking in to the office for his appointment, he was given 324mg  ASA to chew, s/l NTG, EMS was called and he was transported.   The patient was last seen by Dr. Curt Bears in July, at that visit had HVR noted to be SVT (without clear reported symptoms associated with them) though was c/o fatigue and his BB was reduced because of this.   He saw Dr. Irish Lack Oct 11, at that visit mentioned episodic SOB he thought had a component of anxiety though multifactorial with OSA, diastolic CHF, improves with O2, hesitant to increase his diuretics with concern of dehydration. Also noted, has not wanted to pursue RHC given age.  He went to the ER the following day with SOB, EMS noted wheezing and gave resp tx and dose of solucortef given COPD hx that improved his symptoms and O2 sats from 90% > 97%, he was discharged from ER with COPD exacerbation diagnosis, BNP was 35, no mention of tachycardia, no CHF on CXR  He has been noted recently to have episode of elevated HR by [facility] RN, remote pacer interrogation reportedly showed a number of SVT episodes and was  started on Diltiazem  180mg  yesterday via Dr. Curt Bears (his RN).  In discussion with the patient, and even more so the wife at bedside who mentions that his memory has been steadily declining with increasing confusion as well, pending neur eval in January.  The patient states for many months he can hardly walk from the house to the care without getting SOB and pressure in his chest, his wife states SON/DOE has been a problem for a year or more. She states he has told her at rest he has had heaviness in his chest and has taken s/l NTG for both, usually with some improvement, rest will improve his symptoms as well.  They report daily episodes, lasting moments to several minutes, up to 5 or 63minutes.  NO near syncope or syncope.  No palpitations.  He denies any awareness of his heart beat.   His wife mentions increasingly difficulty with confusion and failing memory.  LABS: K+ 3.9 BUN/Creat 22/1.78 poc Trop 0.00 WBC 13.5 H/H 9.5/30.6 plts 258  Device information: SJM dual chamber PPM, implanted  03/04/15, Dr. Curt Bears,  had RV lead revision 04/10/15 (after syncopal event)  Device interrogation: Battery and lead testing are good 141 AMS episodes, EGM available are 1:1, V rates 120's-140's seconds-75minutes in duration   Past Medical History:  Diagnosis Date  . Anxiety   . Arthritis    "knees, back, shoulders" (10/22/2015)  . CAD (coronary artery disease)    a. remote stenting >20 years ago.  b. PTCA unknown vessel ~2013. c. 10/2015: s/p rotational atherectomy/DES to distal RCA and RPDA c/b embolic CVA  . CHB (complete heart block) ([place]) 02/2015   [name] 03/03/2015  . Chronic diastolic CHF (congestive heart failure) ([place])   . Chronic shoulder pain   . [name] (chronic kidney disease), stage III ([place])   . COPD (chronic obstructive pulmonary disease) ([place])   . Dilated aortic root ([place])    a. mild by echo 09/2015.  Marland Kitchen Heart attack [facility]) 'many years ago'  . High cholesterol   . Hypertension   .  OSA (obstructive sleep apnea)    "suppose to have a mask; they haven't got one adjusted for him yet" (10/22/2015)  . Presence of permanent cardiac pacemaker   . Sinus arrest   . Skin cancer of face    "had it cut off"  . Statin intolerance   . Symptomatic bradycardia    a. syncope/sinus arrest and bradycardia s/p St. Jude PPM 02/2015 with lead revision 03/2015.    Past Surgical History:  Procedure Laterality Date  . CATARACT EXTRACTION W/ INTRAOCULAR LENS  IMPLANT, BILATERAL Bilateral 2016  . CORONARY ANGIOPLASTY  2012  . CORONARY ANGIOPLASTY WITH STENT PLACEMENT  2001  . CORONARY ANGIOPLASTY WITH STENT PLACEMENT  10/22/2015   "took 2 out and put 1 longer one in"  . Coronary Stent Intervention Rotablater N/A 10/22/2015   Performed by Jettie Booze, MD at [place] [facility]  . EYE SURGERY    . INSERT / REPLACE / REMOVE PACEMAKER    . Left Heart Cath and Coronary Angiography N/A 10/22/2015   Performed by Jettie Booze, MD at [place] [facility]  . Left Heart Cath and Coronary Angiography N/A 10/17/2015   Performed by Jettie Booze, MD at [place] [facility]  . Pacemaker Implant N/A 03/04/2015   Performed by Constance Haw, MD at [place] [facility]  . PPM Lead Revision/Repair N/A 04/10/2015   Performed by Constance Haw, MD at [place] [facility]  . RETINAL DETACHMENT SURGERY Bilateral    "several on each side"      Meds: reviewed   Allergies:    Allergies  Allergen Reactions  . Novocain [Procaine] Other (See Comments)    Hallucinations  . Rosuvastatin Calcium Other (See Comments)    Whole body ache/pain  . Statins Other (See Comments)    Whole body ache/pain    Social History:   Social History   Socioeconomic History  . Marital status: Married    Spouse name: Not on file  . Number of children: Not on file  . Years of education: Not on file  . Highest education level: Not on file  Social Needs  . Financial resource strain: Not on file    . Food insecurity - worry: Not on file  . Food insecurity - inability: Not on file  . Transportation needs - medical: Not on file  . Transportation needs - non-medical: Not on file  Occupational History  . Not on file  Tobacco Use  . Smoking status: Former Smoker    Packs/day: 1.00    Years: 11.00    Pack years: 11.00    Types: Cigarettes    Start date: 09/09/1947    Last attempt to quit: 09/09/1958    Years since quitting: 58.2  . Smokeless tobacco: Never Used  . Tobacco comment: Significant second-hand exposure. Wife also smokes around him.  Substance and Sexual Activity  .  Alcohol use: Yes    Comment: 10/22/2015 "might have 1 glass of wine/month, if that  . Drug use: No  . Sexual activity: Yes  Other Topics Concern  . Not on file  Social History Narrative   [facility] (10/25/16):   Originally from [place]. Has lived in [place] most of his life. He was a Company secretary. No pets currently. No bird exposure. No mold exposure. Previously like going camping. 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 Significant exposure to dust, etc through his work. No known asbestos exposure.     Family History:    Family History  Problem Relation Age of Onset  . Heart disease Father   . Heart disease Mother   . Diabetes Sister   . Cancer Sister   . Diabetes Brother   . Lung disease Neg Hx      ROS:  Please see the history of present illness.  ROS  All other ROS reviewed and negative.     Physical Exam/Data:   Vitals:   11/26/16 1157 11/26/16 1200 11/26/16 1230 11/26/16 1300  BP: 117/83 115/77 117/75 119/82  Pulse: 74 72 64 62  Resp: (!) 22 (!) 27 19 (!) 22  Temp: 97.7 F (36.5 C)     TempSrc: Oral     SpO2: 96% 96% 95% 96%   No intake or output data in the 24 hours ending 11/26/16 1409 There were no vitals filed for this visit. There is no height or weight on file to calculate BMI.  General:  Well nourished, well developed, in no acute distress HEENT:  normal Lymph: no adenopathy Neck: no JVD Endocrine:  No thryomegaly Vascular: No carotid bruits  Cardiac:  RRR; no murmurs, gallop sor rubs are appreciated Lungs: CTA b/l, no wheezing, rhonchi or rales  Abd: soft, nontender  Ext: no edema Musculoskeletal:  No deformities, age appropriate atrophy Skin: warm and dry  Neuro:  no focal abnormalities noted Psych:  Normal affect   EKG:  The EKG was personally reviewed and demonstrates:   #1 ST 116bpm, PR 252ms, QRS 36ms, no ischemic looking changes #2 SR 77bpm, PR 222ms, QRS 81ms, QTc 491ms, no ischemic looking changes  Telemetry:  Telemetry was personally reviewed and demonstrates:   SR 60's-80's, occ PVCs, brief episodes of narry tachycardia, 120-130bpm, appears ST with occasional block, and rare V paced   Relevant CV Studies:  10/13/16: TTE Study Conclusions - Left ventricle: The cavity size was normal. Wall thickness was   increased in a pattern of moderate LVH. Systolic function was   normal. The estimated ejection fraction was in the range of 60%   to 65%. Wall motion was normal; there were no regional wall   motion abnormalities. Doppler parameters are consistent with   abnormal left ventricular relaxation (grade 1 diastolic   dysfunction). - Aortic valve: Trileaflet; moderately calcified leaflets. There   was no stenosis. There was trivial regurgitation. - Mitral valve: Mildly calcified annulus. There was no significant   regurgitation. - Right ventricle: The cavity size was mildly dilated. Systolic   function was mildly reduced. - Tricuspid valve: Peak RV-RA gradient (S): 20 mm Hg. - Pulmonary arteries: PA peak pressure: 23 mm Hg (S). - Inferior vena cava: The vessel was normal in size. The   respirophasic diameter changes were in the normal range (>= 50%),   consistent with normal central venous pressure. Impressions: - Normal LV size with moderate LV hypertrophy. EF 60-65%. Mildly   dilated  RV with mildly decreased  systolic function. No   significant valvular abnormalities.  10/22/15: PCI  Dist RCA lesion, 90 %stenosed. RPDA lesion, 90 %stenosed. Rotational atherectomy was performed on both vessels with 1.5 and 1.75 burrs.  A STENT SYNERGY DES 2.75X28 drug eluting stent was successfully placed- covering both lesions, postdilated with a 3.5 balloon.  Post intervention, there is a 0% residual stenosis.  LV end diastolic pressure is normal.  There is no aortic valve stenosis.   10/16/16: LHC  Mid LAD lesion, 80 %stenosed, extending into the Ost 2nd Diag lesion, 90 %stenosed. The diagonal is larger than the continuation of the LAD.  Dist RCA lesion, 90 %stenosed.  RPDA lesion, 90 %stenosed. This appears to be instent restenosis. We do not have records of where his prior stents were placed.  THe RCA and PDA lesions are likely the culprit for his symptoms.  Ost Cx to Mid Cx lesion, 25 %stenosed.  The left ventricular systolic function is normal.  The left ventricular ejection fraction is 55-65% by visual estimate.  There is no aortic valve stenosis. Start isosorbide. Start clopidogrel as well. If his angina is not improved, would bring him back for planned PCI of the distal RCA and proximal PDA. This would likely require rotational atherectomy to treat given that both lesions are heavily calcified. This is a large territory as well.  We will contact him early next week to decide on therapy.    Laboratory Data:  Chemistry Recent Labs  Lab 11/26/16 1200  NA 135  K 3.9  CL 103  CO2 22  GLUCOSE 380*  BUN 22*  CREATININE 1.78*  CALCIUM 9.2  GFRNONAA 35*  GFRAA 40*  ANIONGAP 10    No results for input(s): PROT, ALBUMIN, AST, ALT, ALKPHOS, BILITOT in the last 168 hours. Hematology Recent Labs  Lab 11/26/16 1200  WBC 13.5*  RBC 3.72*  HGB 9.5*  HCT 30.6*  MCV 82.3  MCH 25.5*  MCHC 31.0  RDW 15.5  PLT 258   Cardiac EnzymesNo results for input(s): TROPONINI in the last 168  hours.  Recent Labs  Lab 11/26/16 1210  TROPIPOC 0.00    BNP Recent Labs  Lab 11/26/16 1200  BNP 105.3*    DDimer No results for input(s): DDIMER in the last 168 hours.  Radiology/Studies:   Dg Chest 2 View Result Date: 11/26/2016 CLINICAL DATA:  Shortness of breath since last night. EXAM: CHEST  2 VIEW COMPARISON:  10/22/2016. FINDINGS: Stable enlarged cardiac silhouette and tortuous aorta. Clear lungs with normal vascularity. Stable large right diaphragmatic eventration. Stable left subclavian pacemaker leads. IMPRESSION: No acute abnormality. Electronically Signed   By: Claudie Revering M.D.   On: 11/26/2016 13:34    Assessment and Plan:   1. SOB/DOE     Carries dx of COPD     He does not appear overloaded by exam or cxr 2. CP     Last intervention Oct 2706, complicated with embolic stroke     On ASA, plavix, nitrate, BB, ACE     Reported intolerance to statins     One POC trop negative, pending further 3. Tachycardia      Clear Sinus tach at MD office today      EGM available from today for review is 1:1 either ST or AT, V rate 123bpm      Pacer rep reports reproduced symptoms with A pacing 140  The patient's wife describes daily symptoms, he is not having daily tachy episodes,  though agree with addition of cardizem to settle rate/episodes which may contribute to some of his symptoms, reportedly higher doses of BB were poorly tolerated with fatigue.  There is clear exertional component to his DOE (going back a year), as well as some episodes of rest chest discomfort, he may benefit from an ischemic evaluation.  Continue home cardiac medicines on admission   3. Hx of chronic CHF (diastolic)     CXR clear, BNP only 105     Do not suspect fluid OL  4. HTN     Looks OK  5. Progressive confusion, failing memory     Deferred to medicine     pending OP neurology eval (January)   For questions or updates, please contact [place] Please consult [email or web address] for  contact info under Cardiology/STEMI.   Signed, Baldwin [place], PA-C  11/26/2016 2:09 PM   I have seen, examined the patient, and reviewed the above assessment and plan.  Changes to above are made where necessary.  On exam, elderly and forgetful, NAD.  RRR.  Pt with diffuse severe CAD.  He presents with ectopic atrial tachycardia.  PPM interrogation is reviewed and reveals normal PPM function.  His overall tachycardia burden is low and V rates not very high.  I would advise titration of diltiazem as able.  He is admitted to the hospitalist service.  General cardiology to follow for management of CAD.  Given advanced age, dementia, etc, a conservative approach is advised.  Electrophysiology team to see as needed while here. Please call with questions.   Co Sign: Thompson Grayer, MD 11/26/2016 10:45 PM

## 2016-11-27 DIAGNOSIS — R0609 Other forms of dyspnea: Secondary | ICD-10-CM

## 2016-11-27 DIAGNOSIS — J449 Chronic obstructive pulmonary disease, unspecified: Secondary | ICD-10-CM | POA: Diagnosis not present

## 2016-11-27 DIAGNOSIS — E119 Type 2 diabetes mellitus without complications: Secondary | ICD-10-CM

## 2016-11-27 DIAGNOSIS — I2510 Atherosclerotic heart disease of native coronary artery without angina pectoris: Secondary | ICD-10-CM | POA: Diagnosis not present

## 2016-11-27 DIAGNOSIS — I442 Atrioventricular block, complete: Secondary | ICD-10-CM | POA: Diagnosis not present

## 2016-11-27 DIAGNOSIS — I471 Supraventricular tachycardia: Secondary | ICD-10-CM | POA: Diagnosis not present

## 2016-11-27 LAB — CBC
HCT: 31.9 % — ABNORMAL LOW (ref 39.0–52.0)
Hemoglobin: 9.9 g/dL — ABNORMAL LOW (ref 13.0–17.0)
MCH: 25.3 pg — AB (ref 26.0–34.0)
MCHC: 31 g/dL (ref 30.0–36.0)
MCV: 81.6 fL (ref 78.0–100.0)
PLATELETS: 288 10*3/uL (ref 150–400)
RBC: 3.91 MIL/uL — ABNORMAL LOW (ref 4.22–5.81)
RDW: 15.7 % — AB (ref 11.5–15.5)
WBC: 16.5 10*3/uL — ABNORMAL HIGH (ref 4.0–10.5)

## 2016-11-27 LAB — COMPREHENSIVE METABOLIC PANEL
ALT: 17 U/L (ref 17–63)
ANION GAP: 11 (ref 5–15)
AST: 28 U/L (ref 15–41)
Albumin: 3.5 g/dL (ref 3.5–5.0)
Alkaline Phosphatase: 68 U/L (ref 38–126)
BUN: 29 mg/dL — ABNORMAL HIGH (ref 6–20)
CHLORIDE: 102 mmol/L (ref 101–111)
CO2: 23 mmol/L (ref 22–32)
Calcium: 9.4 mg/dL (ref 8.9–10.3)
Creatinine, Ser: 1.54 mg/dL — ABNORMAL HIGH (ref 0.61–1.24)
GFR, EST AFRICAN AMERICAN: 48 mL/min — AB (ref >60)
GFR, EST NON AFRICAN AMERICAN: 41 mL/min — AB (ref >60)
Glucose, Bld: 180 mg/dL — ABNORMAL HIGH (ref 65–99)
POTASSIUM: 4.2 mmol/L (ref 3.5–5.1)
Sodium: 136 mmol/L (ref 135–145)
Total Bilirubin: 0.7 mg/dL (ref 0.3–1.2)
Total Protein: 7.1 g/dL (ref 6.5–8.1)

## 2016-11-27 LAB — GLUCOSE, CAPILLARY
GLUCOSE-CAPILLARY: 195 mg/dL — AB (ref 65–99)
GLUCOSE-CAPILLARY: 189 mg/dL — AB (ref 65–99)
GLUCOSE-CAPILLARY: 234 mg/dL — AB (ref 65–99)
Glucose-Capillary: 324 mg/dL — ABNORMAL HIGH (ref 65–99)

## 2016-11-27 LAB — HEMOGLOBIN A1C
HEMOGLOBIN A1C: 7.7 % — AB (ref 4.8–5.6)
MEAN PLASMA GLUCOSE: 174.29 mg/dL

## 2016-11-27 LAB — TROPONIN I: Troponin I: <0.03 ng/mL (ref <0.03)

## 2016-11-27 MED ORDER — GLIPIZIDE 5 MG PO TABS: 2.5000 mg | ORAL_TABLET | Freq: Every day | ORAL | 0 refills | Status: DC

## 2016-11-27 NOTE — Care Management Note (Signed)
Case Management Note  Patient Details  Name: [name] MRN: [id number] Date of Birth: September 26, 1940  Subjective/Objective:                 Spoke with patient and daughter at bedside. States that patoent has HH through [facility] and PT. Pt in observation, services will resume at DC. No additional DME required.   Action/Plan:   Expected Discharge Date:                  Expected Discharge Plan:  [place]  In-House Referral:     Discharge planning Services  CM Consult  Post Acute Care Choice:  Home Health, Resumption of Svcs/PTA Provider Choice offered to:  Adult Children  DME Arranged:    DME Agency:     HH Arranged:  PT, RN [place] Agency:  [place]  Status of Service:  Completed, signed off  If discussed at [place] of Stay Meetings, dates discussed:    Additional Comments:  Carles Collet, RN 11/27/2016, 3:19 PM

## 2016-11-27 NOTE — Discharge Instructions (Addendum)
It was a pleasure taking care of you Please follow up with your primary care doctor, and the neurologist, and the lung doctor Our clinic will call to make one time appointment for you During this admission you were diagnosed with diabetes. Please start taking glipizide and follow in our clinic next week.

## 2016-11-27 NOTE — Progress Notes (Signed)
EP consult reviewed - no active pacer issues. Not felt to be volume overloaded on exam. Troponin negative x 2. BNP low. No further cardiac work-up recommended. Can follow-up with Dr. Irish Lack after discharge. Cardiology will sign-off. Call with questions.  Pixie Casino, MD, [facility], [place] Director of the [facility] Lipid Disorders &  Cardiovascular Risk Reduction Clinic Attending Cardiologist  Direct Dial: [phone]  Fax: [phone]  Website:  [email or web address]

## 2016-11-27 NOTE — Evaluation (Signed)
Physical Therapy Evaluation Patient Details Name: [name] MRN: [id number] DOB: [birth year] Today's Date: 11/27/2016   History of Present Illness  Pt is a 76 y/o male admitted secondary to SOB and chest pain. Pt found to have SVT. PMH including but not limited to CHF, CVA, COPD, [name] and CAD s/p stenting and pacemaker placement.  Clinical Impression  Pt presented supine in bed with HOB elevated, awake and willing to participate in therapy session. Prior to admission, pt reported that he was independent with ADLs and ambulated with use of SPC when outside of his home. Pt lives with his wife and niece with 24/7 supervision/assistance. Pt with significant cognitive deficits and very poor safety awareness, limiting his independence with functional mobility. Pt's wife is very adamant about him returning home. Pt ambulated in hallway with min A with use of RW and frequent cueing for safety and to maintain attention to task. Pt would continue to benefit from skilled physical therapy services at this time while admitted and after d/c to address the below listed limitations in order to improve overall safety and independence with functional mobility.     Follow Up Recommendations Home health PT;Supervision/Assistance - 24 hour;Other (comment)(pt and family adamantly refusing SNF)    Equipment Recommendations  None recommended by PT    Recommendations for Other Services       Precautions / Restrictions Precautions Precautions: Fall Restrictions Weight Bearing Restrictions: No      Mobility  Bed Mobility Overal bed mobility: Needs Assistance Bed Mobility: Supine to Sit;Sit to Supine     Supine to sit: Supervision Sit to supine: Supervision   General bed mobility comments: for safety  Transfers Overall transfer level: Needs assistance Equipment used: None Transfers: Sit to/from Stand Sit to Stand: Min guard         General transfer comment: increased time, min guard for  safety as pt was slightly unsteady initially upon standing  Ambulation/Gait Ambulation/Gait assistance: Min assist Ambulation Distance (Feet): 100 Feet Assistive device: 1 person hand held assist;Rolling walker (2 wheeled) Gait Pattern/deviations: Step-through pattern;Decreased step length - right;Decreased step length - left;Decreased stride length;Drifts right/left Gait velocity: decreased Gait velocity interpretation: Below normal speed for age/gender General Gait Details: modest instability requiring min A with and without RW; pt very easily distracted and with very poor safety awareness  Stairs            Wheelchair Mobility    Modified Rankin (Stroke Patients Only)       Balance Overall balance assessment: Needs assistance Sitting-balance support: Feet supported Sitting balance-Leahy Scale: Fair     Standing balance support: During functional activity;Single extremity supported;Bilateral upper extremity supported Standing balance-Leahy Scale: Poor                               Pertinent Vitals/Pain Pain Assessment: No/denies pain    Home Living Family/patient expects to be discharged to:: Private residence Living Arrangements: Spouse/significant other;Other relatives Available Help at Discharge: Family;Available 24 hours/day Type of Home: Mobile home Home Access: Ramped entrance     Home Layout: One level Home Equipment: Melvin - 2 wheels;Walker - 4 wheels;Shower seat;Cane - single point      Prior Function Level of Independence: Independent with assistive device(s)         Comments: pt ambulates with SPC outside of home     Hand Dominance        Extremity/Trunk Assessment   Upper  Extremity Assessment Upper Extremity Assessment: Generalized weakness    Lower Extremity Assessment Lower Extremity Assessment: Generalized weakness       Communication   Communication: No difficulties  Cognition Arousal/Alertness:  Awake/alert Behavior During Therapy: WFL for tasks assessed/performed Overall Cognitive Status: Impaired/Different from baseline Area of Impairment: Orientation;Attention;Memory;Following commands;Safety/judgement;Awareness;Problem solving                 Orientation Level: Disoriented to;Person;Time;Situation Current Attention Level: Sustained Memory: Decreased short-term memory Following Commands: Follows one step commands consistently;Follows one step commands with increased time Safety/Judgement: Decreased awareness of deficits;Decreased awareness of safety Awareness: Intellectual Problem Solving: Difficulty sequencing;Requires verbal cues;Requires tactile cues        General Comments      Exercises     Assessment/Plan    PT Assessment Patient needs continued PT services  PT Problem List Decreased strength;Decreased activity tolerance;Decreased balance;Decreased mobility;Decreased coordination;Decreased cognition;Decreased knowledge of use of DME;Decreased safety awareness;Decreased knowledge of precautions;Cardiopulmonary status limiting activity       PT Treatment Interventions DME instruction;Gait training;Stair training;Functional mobility training;Therapeutic activities;Therapeutic exercise;Balance training;Neuromuscular re-education;Patient/family education;Cognitive remediation    PT Goals (Current goals can be found in the Care Plan section)  Acute Rehab PT Goals Patient Stated Goal: figure out what is going on PT Goal Formulation: With patient/family Time For Goal Achievement: 12/11/16 Potential to Achieve Goals: Fair    Frequency Min 3X/week   Barriers to discharge        Co-evaluation               AM-PAC PT "6 Clicks" Daily Activity  Outcome Measure Difficulty turning over in bed (including adjusting bedclothes, sheets and blankets)?: None Difficulty moving from lying on back to sitting on the side of the bed? : None Difficulty sitting  down on and standing up from a chair with arms (e.g., wheelchair, bedside commode, etc,.)?: A Lot Help needed moving to and from a bed to chair (including a wheelchair)?: A Little Help needed walking in hospital room?: A Little Help needed climbing 3-5 steps with a railing? : A Lot 6 Click Score: 18    End of Session Equipment Utilized During Treatment: Gait belt Activity Tolerance: Patient limited by fatigue Patient left: in bed;with call bell/phone within reach;with bed alarm set;with family/visitor present Nurse Communication: Mobility status PT Visit Diagnosis: Difficulty in walking, not elsewhere classified (R26.2)    Time: 5462-7035 PT Time Calculation (min) (ACUTE ONLY): 35 min   Charges:   PT Evaluation $PT Eval Moderate Complexity: 1 Mod PT Treatments $Gait Training: 8-22 mins   PT G Codes:   PT G-Codes **NOT FOR INPATIENT CLASS** Functional Assessment Tool Used: AM-PAC 6 Clicks Basic Mobility;Clinical judgement Functional Limitation: Mobility: Walking and moving around Mobility: Walking and Moving Around Current Status (K0938): At least 40 percent but less than 60 percent impaired, limited or restricted Mobility: Walking and Moving Around Goal Status [phone]): At least 1 percent but less than 20 percent impaired, limited or restricted    [facility], [place], DPT [place] 11/27/2016, 12:58 PM

## 2016-11-27 NOTE — Progress Notes (Signed)
   Subjective: [name] was seen laying in his bed this morning with his wife at bedside. His wife stated that the patient was not able to sleep last night and seems more disoriented than usual. The patient denied any shortness of breath or chest pain.   Objective:  Vital signs in last 24 hours: Vitals:   11/27/16 0809 11/27/16 0901 11/27/16 1032 11/27/16 1147  BP: 124/88 132/82  123/76  Pulse: 60 63  62  Resp: 19   14  Temp: 97.9 F (36.6 C)   97.9 F (36.6 C)  TempSrc: Oral   Oral  SpO2: 95%  95% 94%  Weight:       Physical Exam  Constitutional: He appears well-developed and well-nourished.  Non-toxic appearance. He does not appear ill.  HENT:  Head: Normocephalic and atraumatic.  Cardiovascular: Normal rate and regular rhythm.  Pulmonary/Chest: Effort normal and breath sounds normal. No accessory muscle usage. No respiratory distress.  Abdominal: Soft. Bowel sounds are normal. He exhibits no distension. There is no tenderness.  Neurological: He is alert.  Psychiatric: He has a normal mood and affect. His behavior is normal. His mood appears not anxious. He is not agitated.   Assessment/Plan:  Shortness of breath The patient's sob is likely to be multifactorial due to his COPD, restrictive lung disease, OSA, diastolic heart failure, and possibly a component of pulmonary hypertension as well. Ruled out for pneumonia and pe. The patient is euvolemic on exam and saturating well on room air.  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 -SSI  Dispo: Anticipated discharge in approximately 1-2 day(s).   Lars Mage, MD Internal Medicine PGY1 Pager:[phone] 11/27/2016, 1:20 PM

## 2016-11-27 NOTE — Progress Notes (Signed)
SATURATION QUALIFICATIONS: (This note is used to comply with regulatory documentation for home oxygen)  Patient Saturations on Room Air at Rest = 97%  Patient Saturations on Room Air while Ambulating = 92%   

## 2016-11-28 NOTE — Discharge Summary (Signed)
Name: [name] MRN: [id number] DOB: 1940-03-25 76 y.o. PCP: Maris Berger, MD  Date of Admission: 11/26/2016 11:43 AM Date of Discharge: 11/27/2016 Attending Physician: Dr. Lalla Brothers  Discharge Diagnosis:  Active Problems:   SVT (supraventricular tachycardia) ([place]) .  Discharge Medications: Allergies as of 11/27/2016      Reactions   Novocain [procaine] Other (See Comments)   Hallucinations   Rosuvastatin Calcium Other (See Comments)   Whole body ache/pain   Statins Other (See Comments)   Whole body ache/pain      Medication List    TAKE these medications   acetaminophen 500 MG tablet Commonly known as:  TYLENOL Take 500 mg by mouth every 6 (six) hours as needed for headache.   aspirin EC 81 MG tablet Take 81 mg by mouth at bedtime.   clopidogrel 75 MG tablet Commonly known as:  PLAVIX Take 1 tablet (75 mg total) by mouth daily. What changed:  when to take this   diltiazem 180 MG 24 hr capsule Commonly known as:  CARDIZEM CD Take 1 capsule (180 mg total) daily by mouth. What changed:  when to take this   Glycopyrrolate-Formoterol 9-4.8 MCG/ACT Aero Commonly known as:  BEVESPI AEROSPHERE Inhale 2 puffs into the lungs 2 (two) times daily.   isosorbide mononitrate 30 MG 24 hr tablet Commonly known as:  IMDUR Take 1 tablet (30 mg total) by mouth daily.   levothyroxine 25 MCG tablet Commonly known as:  SYNTHROID, LEVOTHROID Take 25 mcg by mouth daily before breakfast.   metoprolol succinate 25 MG 24 hr tablet Commonly known as:  TOPROL XL Take 1 tablet (25 mg total) by mouth daily. Take with or immediately following a meal.   multivitamin with minerals Tabs tablet Take 1 tablet by mouth daily.   nitroGLYCERIN 0.4 MG SL tablet Commonly known as:  NITROSTAT Place 0.4 mg under the tongue daily as needed for chest pain.   PROAIR HFA 108 (90 Base) MCG/ACT inhaler Generic drug:  albuterol Inhale 1-2 puffs into the lungs every 4 (four) hours  as needed for shortness of breath.   ramipril 2.5 MG capsule Commonly known as:  ALTACE Take 2.5 mg by mouth at bedtime.   Spacer/Aero Chamber Mouthpiece Misc 1 Device by Does not apply route as directed.   torsemide 20 MG tablet Commonly known as:  DEMADEX Take 20 mg by mouth daily.   VITAMIN B-12 PO Place 1 mL under the tongue daily.       Disposition and follow-up:   [name] was discharged from [facility] in good condition.  At the hospital follow up visit please address:  1.  Acute on chronic dyspnea: ensure that the patient is saturating well on room air and does not have chest pain.   2.  Labs / imaging needed at time of follow-up: pfts, sleep study  3.  Pending labs/ test needing follow-up: hba1c, cmp to check for renal function improvment  Follow-up Appointments: Follow-up Information    Maris Berger, MD. Schedule an appointment as soon as possible for a visit in 1 week(s).   Specialty:  Family Medicine Contact information: [address] [place] [address] [phone]        [facility]. Go to.   Specialty:  Pulmonology Why:  pl go to your appointment with dr Ashok Cordia - call to confirm Contact information: [place] [phone]  [facility], [place] Follow up.   Why:  home health will resume after discharge.  Contact information: [place] [address] [phone]        [place] Follow up.   Why:  clinic will call to make appt  Contact information: [address] [place] Hospital Course by problem list:  Acute on chronic dyspnea with multifactorial etiology Supraventricular tachycardia The patient presented to the ED from the pulmonary clinic where he developed shortness of breath and accompanied mild chest pain after walking a  short distance to the clinic. Troponins were negatie and EKG did not show any st or t wave changes. Chest x-ray did not show any acute cardiopulmonary changes. BNP was mildly elevated at 105.3. The presentation was also not consistent with pulmonary embolism. The patient was euvolemic on exam and saturating well on room air. The patient saturated at 97% at rest and 92% with ambulation. The etiology of the patient's shortness of breath is likely to be multifactorial due to complicated cardiac history, untreated osa, and undiagnosed pulmonary hypertension. The patient's pacemaker was interrogated and showed frequent episodes of SVT. EP saw the patient and mentioned that the pacer did not have any issues and that no further ischemic workup was also necessary. The patient was discharged as there was no acute cause for the patient to remain hospitalized and recommended to follow up as an outpatient.   Diabetes Mellitus: The patient was newly diagnosed with diabetes at this hospital visit as his glucose on admission was 276 and continued to be elevated between 189-324. The patient was placed on sliding scale during his hospitalization and was discharged on starting dose of glipizide 2.5mg .  Discharge Vitals:   BP 119/63 (BP Location: Right Arm)   Pulse 60   Temp 98 F (36.7 C) (Oral)   Resp 16   Wt 221 lb (100.2 kg)   SpO2 95%   BMI 29.97 kg/m   Pertinent Labs, Studies, and Procedures:   Chest x-ray (11/26/16): no acute abnormality  BNP=105.3  Troponin x3:<0.03  Discharge Instructions: Discharge Instructions    Diet - low sodium heart healthy   Complete by:  As directed    Increase activity slowly   Complete by:  As directed       Signed: Lars Mage, MD Internal Medicine PGY1 Pager:[phone]

## 2016-11-29 ENCOUNTER — Ambulatory Visit (INDEPENDENT_AMBULATORY_CARE_PROVIDER_SITE_OTHER): Payer: Medicare Other | Admitting: Internal Medicine

## 2016-11-29 ENCOUNTER — Telehealth: Payer: Self-pay | Admitting: Pulmonary Disease

## 2016-11-29 ENCOUNTER — Other Ambulatory Visit: Payer: Self-pay | Admitting: Internal Medicine

## 2016-11-29 ENCOUNTER — Telehealth: Payer: Self-pay | Admitting: *Deleted

## 2016-11-29 ENCOUNTER — Other Ambulatory Visit: Payer: Self-pay

## 2016-11-29 VITALS — BP 126/63 | HR 60 | Temp 98.2°F | Ht 73.0 in | Wt 233.8 lb

## 2016-11-29 DIAGNOSIS — G4700 Insomnia, unspecified: Secondary | ICD-10-CM | POA: Diagnosis not present

## 2016-11-29 DIAGNOSIS — E113591 Type 2 diabetes mellitus with proliferative diabetic retinopathy without macular edema, right eye: Secondary | ICD-10-CM

## 2016-11-29 DIAGNOSIS — F0390 Unspecified dementia without behavioral disturbance: Secondary | ICD-10-CM

## 2016-11-29 DIAGNOSIS — G3184 Mild cognitive impairment, so stated: Secondary | ICD-10-CM

## 2016-11-29 DIAGNOSIS — I2510 Atherosclerotic heart disease of native coronary artery without angina pectoris: Secondary | ICD-10-CM | POA: Diagnosis not present

## 2016-11-29 DIAGNOSIS — G4733 Obstructive sleep apnea (adult) (pediatric): Secondary | ICD-10-CM

## 2016-11-29 DIAGNOSIS — I442 Atrioventricular block, complete: Secondary | ICD-10-CM | POA: Diagnosis not present

## 2016-11-29 DIAGNOSIS — F321 Major depressive disorder, single episode, moderate: Secondary | ICD-10-CM | POA: Diagnosis not present

## 2016-11-29 DIAGNOSIS — E119 Type 2 diabetes mellitus without complications: Secondary | ICD-10-CM

## 2016-11-29 DIAGNOSIS — I5030 Unspecified diastolic (congestive) heart failure: Secondary | ICD-10-CM

## 2016-11-29 DIAGNOSIS — Z0189 Encounter for other specified special examinations: Secondary | ICD-10-CM | POA: Diagnosis not present

## 2016-11-29 DIAGNOSIS — Z87891 Personal history of nicotine dependence: Secondary | ICD-10-CM

## 2016-11-29 DIAGNOSIS — R0609 Other forms of dyspnea: Secondary | ICD-10-CM

## 2016-11-29 DIAGNOSIS — J984 Other disorders of lung: Secondary | ICD-10-CM

## 2016-11-29 DIAGNOSIS — J449 Chronic obstructive pulmonary disease, unspecified: Secondary | ICD-10-CM | POA: Diagnosis not present

## 2016-11-29 DIAGNOSIS — Z7722 Contact with and (suspected) exposure to environmental tobacco smoke (acute) (chronic): Secondary | ICD-10-CM

## 2016-11-29 DIAGNOSIS — Z950 Presence of cardiac pacemaker: Secondary | ICD-10-CM

## 2016-11-29 MED ORDER — GLIPIZIDE 5 MG PO TABS: 5.0000 mg | ORAL_TABLET | Freq: Every day | ORAL | 0 refills | Status: DC

## 2016-11-29 MED ORDER — ESCITALOPRAM OXALATE 10 MG PO TABS: 10.0000 mg | ORAL_TABLET | Freq: Every day | ORAL | 2 refills | Status: AC

## 2016-11-29 NOTE — Telephone Encounter (Signed)
Pt and family present to front desk stating they were told to come in at 1515 to be seen Pt disch sat, states just found out he's diab during inpt Pt's cbg reading has been 260 to 300's since This am fasting 261 1400 cbg 300+ had cheeseburger, hashbrowns and reg dr pepper for lunch at 1200 Continues to eat as normal for him Pt is without symptoms Wants to be seen today Please advise

## 2016-11-29 NOTE — Progress Notes (Signed)
CC: Hospital follow up for dyspnea, new patient to clinic  HPI:  BrandonBrandon Hendrix is a 76 y.o. male with PMHx detailed below presenting for hospital follow up for dyspnea. He was also newly diagnosed with diabetes at that time.  He has been seen by Dr. Salvadore Hendrix as his PCP for many years. They like him as a physician but have difficulty visiting him because his office is [facility] [place] while [name] now lives here in [place]. They wish to transfer primary care offices to here in order to have easier transportation to office visits. They are also followed by cardiology clinic on [address].  See problem based assessment and plan below for additional details.  Type 2 diabetes mellitus without complication, without long-term current use of insulin ([place]) He was newly diagnosed as diabetic with a Hgb A1c of 7.7% during his recent hospitalization. This was very distressing to his wife and niece. They had multiple question about how this could contribute to his worsening health over the past year. I do not think this is a major contributor to his hospitalization or overall health. This seems unlikely to explain his right sided proliferative retinopathy and he has no obvious symptoms of hyperglycemia. We can treat this although maybe discontinue medication in the future since a goal of 7.5 or 8.0% may be reasonable. Plan Glipizide 5mg  daily Recommended limiting carbohydrate heavy foods  OSA (obstructive sleep apnea) He has severe OSA based on most recent sleep study in February 2018. According to his family he has already been evaluated for this but cannot tolerate a CPAP mask at night is the reason for nontreatment. He is seeing Dr. Creig Hendrix for treatment of restrictive lung disease as well as CPAP titration.  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  His loss of visual acuity has made him unable to read well despite corrective lenses so he has had to quit this job. He denies being overtly depressed and PHQ-2 score today is 0. However he is much less socially engaged and sits around the home more than he ever had before this year. He was previously taking citalopram 10mg  and is not sure if this helped or not. Plan Reordered citalopram 10mg  Recommend checking full PHQ-9 at next visit  DOE (dyspnea on exertion) He was hospitalized for dyspnea which is an ongoing, episodic problem. He is felt to have COPD, OSA, dCHF, CAD, also heart block with PPM that could be contributing. In this recent hospitalization he was not significantly hypoxic although did show some tachydysrhythmias. He does not seem particularly volume overloaded with 1+ pedal edema and his weight is stable at 233 lbs. He was started on diltiazem ER 180mg  with plans to follow up in cardiology/EP clinic as well. Plan Continue diltiazem 180mg  ER 180mg  Recommended family record how often he experiences episodic dyspnea and whether with exertion or resting RTC next month  Mild cognitive impairment He has suffered from worsening disorientation and impaired short term recall during the past year. He has been informed by Dr. Selena Hendrix that he has progressive dementia and probably needs increasing assistance for daily living within the next few months such as ALF placement. Previous MoCA testing at past hospitalization scored 15 suggesting moderate or severe dementia. This could be vascular dementia with relatively sudden decrease in function, versus a rapidly progressive alzheimer's disease. I do not think depression  or acute illness alone explains this worsening. There has not been a repeated test or neuropsychiatric testing outpatient. [name]' family is interested in a neurology clinic evaluation and has already scheduled this in January for further evaluation.    Past Medical History:    Diagnosis Date  . Anxiety   . Arthritis    "knees, back, shoulders" (11/26/2016)  . CAD (coronary artery disease)    a. remote stenting >20 years ago. b. PTCA unknown vessel ~2013. c. 10/2015: s/p rotational atherectomy/DES to distal RCA and RPDA c/b embolic CVA  . CHB (complete heart block) ([place]) 02/2015   [name] 03/03/2015  . Chronic diastolic CHF (congestive heart failure) ([place])   . Chronic shoulder pain    "both shoulders" (11/26/2016)  . [name] (chronic kidney disease), stage III ([place])   . COPD (chronic obstructive pulmonary disease) ([place])   . Depression   . Dilated aortic root ([place])    a. mild by echo 09/2015.  Marland Kitchen GERD (gastroesophageal reflux disease)   . Headache    "weekly" (11/26/2016)  . Heart attack [facility]) 'many years ago'  . High cholesterol   . Hypertension   . Hypothyroidism   . OSA (obstructive sleep apnea)    "suppose to have a mask; they haven't got one adjusted for him yet" (11/26/2016)  . Presence of permanent cardiac pacemaker   . 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 Skin cancer of face    "had it cut off"  . Statin intolerance   . Stroke [facility]) 10/2015   "mini stroke; been having memory issues & confusion ever since" (11/26/2016)  . Symptomatic bradycardia    a. syncope/sinus arrest and bradycardia s/p St. Jude PPM 02/2015 with lead revision 03/2015.    Review of Systems: Review of Systems  Constitutional: Negative for chills and fever.  HENT: Negative for hearing loss.   Eyes: Positive for blurred vision.  Respiratory: Positive for shortness of breath. Negative for wheezing.   Cardiovascular: Positive for leg swelling. Negative for chest pain.  Gastrointestinal: Negative for constipation and diarrhea.  Genitourinary: Negative for dysuria.  Musculoskeletal: Negative for falls.  Skin: Negative for rash.  Neurological: Negative for sensory change.  Hendrix/Heme/Allergies: Negative for polydipsia.  Psychiatric/Behavioral: Positive for memory loss. The patient has  insomnia.      Physical Exam: Vitals:   11/29/16 1557  BP: 126/63  Pulse: 60  Temp: 98.2 F (36.8 C)  TempSrc: Oral  SpO2: 99%  Weight: 233 lb 12.8 oz (106.1 kg)  Height: 6\' 1"  (1.854 m)   GENERAL- alert, co-operative, NAD HEENT- PERRL, EOMI, oral mucosa appears moist, no cervical LN enlargement. CARDIAC- RRR, no murmurs, rubs or gallops. RESP- CTAB, no wheezes or crackles. ABDOMEN- Soft, nontender, no guarding or rebound NEURO- No obvious Cr N abnormality, strength upper and lower extremities- 5/5, Sensation intact globally EXTREMITIES- Symmetric, 1+ pitting edema in legs bilaterally SKIN- Warm, dry, No rash or lesion. PSYCH- Not oriented to date or place, knows self and year, able to describe remote events clearly    Assessment & Plan:   See encounters tab for problem based medical decision making.   Patient discussed with Dr. Eppie Gibson

## 2016-11-29 NOTE — Patient Instructions (Addendum)
I recommend increasing the glipizide dose to 5mg  daily based on the level of high blood sugars we have seen so far.  We will need to see if the diltiazem gives any benefit to reducing attacks of shortness of breath. I would like to see you back in 2-3 weeks and see how frequent these have been.  We will see if there is a good option for neurology evaluation sooner than January or not.

## 2016-11-29 NOTE — Telephone Encounter (Signed)
Please let the patient know that his breathing test suggested COPD with a significant bronchodilator response. He did have moderate restriction on his lung volumes as well as a reduced carbon monoxide diffusion capacity indicating that he is having trouble taking a full deep breath in. However, his walk test did not show any evidence of oxygen requirement or significant desaturation.

## 2016-11-29 NOTE — Telephone Encounter (Signed)
Pt and spouse are requesting PFT results from 10/23.   JN please advise.  Thanks!

## 2016-11-29 NOTE — Telephone Encounter (Signed)
lmtcb for pt to make aware of results/recs.

## 2016-11-29 NOTE — Telephone Encounter (Signed)
Will work patient in today

## 2016-11-30 ENCOUNTER — Ambulatory Visit: Payer: Medicare Other

## 2016-11-30 NOTE — Telephone Encounter (Signed)
Spoke with pt's wife, May. She is aware of pt's PFT results. Nothing further was needed.

## 2016-11-30 NOTE — Telephone Encounter (Signed)
Stanton Kidney, patient's wife returned call, CB is [phone].

## 2016-12-01 ENCOUNTER — Telehealth: Payer: Self-pay | Admitting: *Deleted

## 2016-12-01 ENCOUNTER — Telehealth: Payer: Self-pay | Admitting: Interventional Cardiology

## 2016-12-01 ENCOUNTER — Telehealth: Payer: Self-pay | Admitting: Pulmonary Disease

## 2016-12-01 NOTE — Assessment & Plan Note (Signed)
He was newly diagnosed as diabetic with a Hgb A1c of 7.7% during his recent hospitalization. This was very distressing to his wife and niece. They had multiple question about how this could contribute to his worsening health over the past year. I do not think this is a major contributor to his hospitalization or overall health. This seems unlikely to explain his right sided proliferative retinopathy and he has no obvious symptoms of hyperglycemia. We can treat this although maybe discontinue medication in the future since a goal of 7.5 or 8.0% may be reasonable. Plan Glipizide 5mg  daily Recommended limiting carbohydrate heavy foods

## 2016-12-01 NOTE — Telephone Encounter (Signed)
Spoke with patients wife, she is aware that Dr. Ashok Cordia had to cancel the appointment on the 28th of November due to provider reasons. Patient is being seen on the 29th at 1:30. Patients wife verbalized understanding and nothing further is needed at this time.

## 2016-12-01 NOTE — Assessment & Plan Note (Signed)
He was hospitalized for dyspnea which is an ongoing, episodic problem. He is felt to have COPD, OSA, dCHF, CAD, also heart block with PPM that could be contributing. In this recent hospitalization he was not significantly hypoxic although did show some tachydysrhythmias. He does not seem particularly volume overloaded with 1+ pedal edema and his weight is stable at 233 lbs. He was started on diltiazem ER 180mg  with plans to follow up in cardiology/EP clinic as well. Plan Continue diltiazem 180mg  ER 180mg  Recommended family record how often he experiences episodic dyspnea and whether with exertion or resting RTC next month

## 2016-12-01 NOTE — Assessment & Plan Note (Addendum)
He has suffered from worsening disorientation and impaired short term recall during the past year. He has been informed by Dr. Selena Batten that he has progressive dementia and probably needs increasing assistance for daily living within the next few months such as ALF placement. Previous MoCA testing at past hospitalization scored 15 suggesting moderate or severe dementia. This could be vascular dementia with relatively sudden decrease in function, versus a rapidly progressive alzheimer's disease. I do not think depression or acute illness alone explains this worsening. There has not been a repeated test or neuropsychiatric testing outpatient. [name]' family is interested in a neurology clinic evaluation and has already scheduled this in January for further evaluation.

## 2016-12-01 NOTE — Telephone Encounter (Signed)
HHN calls and is anxious along w/ family because readings of cbg after appr 3 days of new dose of glipizide are cont to be hi- 250's to 370's, pt cont to eat whatever he desires. Pt is asymptomatic of hyperglycemia. He states he is taking his meds as directed but he is not at this time going to change his food choices.  Spoke w/ donnap., A1C 7.7, may take 7 to 14 days to see results of glipizide, low carb meals need to be the goal, check cbg fasting and before meals, meter may need to be calibrated.  Will continue to speak w/ HHN as she visits, will encourage appt w/ donnap- pt and family

## 2016-12-01 NOTE — Telephone Encounter (Signed)
New Message  Anderson Malta called on behalf of pt to inform Dr. Tamala Julian pt is taking cartia XT 180mg .   She also would like to know if pt is suppose to be taking ASA and Plavix. Please call back to discuss

## 2016-12-01 NOTE — Telephone Encounter (Signed)
Ok called to confirm the appt on 11/29. Nothing further is needed.

## 2016-12-01 NOTE — Assessment & Plan Note (Signed)
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 His loss of visual acuity has made him unable to read well despite corrective lenses so he has had to quit this job. He denies being overtly depressed and PHQ-2 score today is 0. However he is much less socially engaged and sits around the home more than he ever had before this year. He was previously taking citalopram 10mg  and is not sure if this helped or not. Plan Reordered citalopram 10mg  Recommend checking full PHQ-9 at next visit

## 2016-12-01 NOTE — Telephone Encounter (Signed)
Returned call and Hope from [facility] wanted to let us know that the patient is taking diltiazem 180 mg QD. I let her know that we were aware. She is also asking if the patient is suppose to be taking ASA and Plavix and I let her know that he should. She verbalizes understanding and thanked me for the call.

## 2016-12-01 NOTE — Assessment & Plan Note (Addendum)
He has severe OSA based on most recent sleep study in February 2018. According to his family he has already been evaluated for this but cannot tolerate a CPAP mask at night is the reason for nontreatment. He is seeing Dr. Creig Hines for treatment of restrictive lung disease as well as CPAP titration.

## 2016-12-02 NOTE — Progress Notes (Signed)
Case discussed with Dr. Rice at the time of the visit.  We reviewed the resident's history and exam and pertinent patient test results.  I agree with the assessment, diagnosis, and plan of care documented in the resident's note. 

## 2016-12-03 NOTE — Telephone Encounter (Signed)
Please request that blood sugar readings be recorded before and after meals for at least a week. We can assess whether dosage adjustment is required accordingly. Thank you!

## 2016-12-06 ENCOUNTER — Emergency Department (HOSPITAL_COMMUNITY)
Admission: EM | Admit: 2016-12-06 | Discharge: 2016-12-06 | Disposition: A | Discharge status: Home / Self Care | Payer: Medicare Other | Attending: Emergency Medicine | Admitting: Emergency Medicine

## 2016-12-06 ENCOUNTER — Emergency Department (HOSPITAL_COMMUNITY): Payer: Medicare Other

## 2016-12-06 ENCOUNTER — Ambulatory Visit: Payer: Medicare Other

## 2016-12-06 ENCOUNTER — Encounter (HOSPITAL_COMMUNITY): Payer: Self-pay

## 2016-12-06 ENCOUNTER — Other Ambulatory Visit: Payer: Self-pay

## 2016-12-06 DIAGNOSIS — Z87891 Personal history of nicotine dependence: Secondary | ICD-10-CM | POA: Diagnosis not present

## 2016-12-06 DIAGNOSIS — I252 Old myocardial infarction: Secondary | ICD-10-CM | POA: Insufficient documentation

## 2016-12-06 DIAGNOSIS — Z7902 Long term (current) use of antithrombotics/antiplatelets: Secondary | ICD-10-CM | POA: Diagnosis not present

## 2016-12-06 DIAGNOSIS — I2510 Atherosclerotic heart disease of native coronary artery without angina pectoris: Secondary | ICD-10-CM | POA: Diagnosis not present

## 2016-12-06 DIAGNOSIS — I509 Heart failure, unspecified: Secondary | ICD-10-CM | POA: Insufficient documentation

## 2016-12-06 DIAGNOSIS — N183 Chronic kidney disease, stage 3 (moderate): Secondary | ICD-10-CM | POA: Insufficient documentation

## 2016-12-06 DIAGNOSIS — F0390 Unspecified dementia without behavioral disturbance: Secondary | ICD-10-CM | POA: Insufficient documentation

## 2016-12-06 DIAGNOSIS — R079 Chest pain, unspecified: Secondary | ICD-10-CM | POA: Insufficient documentation

## 2016-12-06 DIAGNOSIS — E1122 Type 2 diabetes mellitus with diabetic chronic kidney disease: Secondary | ICD-10-CM | POA: Diagnosis not present

## 2016-12-06 DIAGNOSIS — R0602 Shortness of breath: Secondary | ICD-10-CM | POA: Insufficient documentation

## 2016-12-06 DIAGNOSIS — I129 Hypertensive chronic kidney disease with stage 1 through stage 4 chronic kidney disease, or unspecified chronic kidney disease: Secondary | ICD-10-CM | POA: Insufficient documentation

## 2016-12-06 DIAGNOSIS — J449 Chronic obstructive pulmonary disease, unspecified: Secondary | ICD-10-CM | POA: Insufficient documentation

## 2016-12-06 DIAGNOSIS — Z7982 Long term (current) use of aspirin: Secondary | ICD-10-CM | POA: Insufficient documentation

## 2016-12-06 DIAGNOSIS — I110 Hypertensive heart disease with heart failure: Secondary | ICD-10-CM | POA: Diagnosis not present

## 2016-12-06 LAB — BASIC METABOLIC PANEL
Anion gap: 9 (ref 5–15)
BUN: 36 mg/dL — ABNORMAL HIGH (ref 6–20)
CHLORIDE: 102 mmol/L (ref 101–111)
CO2: 22 mmol/L (ref 22–32)
Calcium: 9 mg/dL (ref 8.9–10.3)
Creatinine, Ser: 1.66 mg/dL — ABNORMAL HIGH (ref 0.61–1.24)
GFR, EST AFRICAN AMERICAN: 44 mL/min — AB (ref >60)
GFR, EST NON AFRICAN AMERICAN: 38 mL/min — AB (ref >60)
Glucose, Bld: 121 mg/dL — ABNORMAL HIGH (ref 65–99)
POTASSIUM: 3.5 mmol/L (ref 3.5–5.1)
SODIUM: 133 mmol/L — AB (ref 135–145)

## 2016-12-06 LAB — CBC
HEMATOCRIT: 32.6 % — AB (ref 39.0–52.0)
HEMOGLOBIN: 10.4 g/dL — AB (ref 13.0–17.0)
MCH: 25.7 pg — ABNORMAL LOW (ref 26.0–34.0)
MCHC: 31.9 g/dL (ref 30.0–36.0)
MCV: 80.7 fL (ref 78.0–100.0)
Platelets: 335 10*3/uL (ref 150–400)
RBC: 4.04 MIL/uL — AB (ref 4.22–5.81)
RDW: 16.5 % — AB (ref 11.5–15.5)
WBC: 16.6 10*3/uL — AB (ref 4.0–10.5)

## 2016-12-06 LAB — I-STAT TROPONIN, ED
TROPONIN I, POC: 0.01 ng/mL (ref 0.00–0.08)
Troponin i, poc: 0.02 ng/mL (ref 0.00–0.08)

## 2016-12-06 NOTE — Discharge Instructions (Signed)
Tests showed no life-threatening condition.  Return if worse or follow-up your primary care doctor.

## 2016-12-06 NOTE — ED Provider Notes (Signed)
[facility] EMERGENCY DEPARTMENT Provider Note   CSN: [id number] Arrival date & time: 12/06/16  1313     History   Chief Complaint Chief Complaint  Patient presents with  . Shortness of Breath    HPI Carolyn Maniscalco is a 76 y.o. male.  Level 5 caveat for mild dementia.  Most of history obtained from caregiver.  Patient presents with intermittent dyspnea today.  He reported chest pain yesterday which required nitroglycerin x2 doses.  He has a host of cardiovascular health problems including a pacemaker Feb 2017, defibrillator, CAD, stents.  He was recently admitted to the hospital 11/26/16 and discharged on 11/27/16 for similar concerns.  Caregiver reports a pulse of 160-180 this morning at 11 AM for short period of time.  He has been anemic in the past requiring transfusions.      Past Medical History:  Diagnosis Date  . Anxiety   . Arthritis    "knees, back, shoulders" (11/26/2016)  . CAD (coronary artery disease)    a. remote stenting >20 years ago. b. PTCA unknown vessel ~2013. c. 10/2015: s/p rotational atherectomy/DES to distal RCA and RPDA c/b embolic CVA  . CHB (complete heart block) ([place]) 02/2015   [name] 03/03/2015  . Chronic diastolic CHF (congestive heart failure) ([place])   . Chronic shoulder pain    "both shoulders" (11/26/2016)  . [name] (chronic kidney disease), stage III ([place])   . COPD (chronic obstructive pulmonary disease) ([place])   . Depression   . Dilated aortic root ([place])    a. mild by echo 09/2015.  Marland Kitchen GERD (gastroesophageal reflux disease)   . Headache    "weekly" (11/26/2016)  . Heart attack [facility]) 'many years ago'  . High cholesterol   . Hypertension   . Hypothyroidism   . OSA (obstructive sleep apnea)    "suppose to have a mask; they haven't got one adjusted for him yet" (11/26/2016)  . Presence of permanent cardiac pacemaker   . 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 Skin cancer of face    "had it cut off"  . Statin intolerance   . Stroke  [facility]) 10/2015   "mini stroke; been having memory issues & confusion ever since" (11/26/2016)  . Symptomatic bradycardia    a. syncope/sinus arrest and bradycardia s/p St[employer] Jude PPM 02/2015 with lead revision 03/2015.    Patient Active Problem List   Diagnosis Date Noted  . Type 2 diabetes mellitus without complication, without long-term current use of insulin ([place]) 11/29/2016  . SVT (supraventricular tachycardia) ([place]) 11/26/2016  . Alpha-1-antitrypsin deficiency carrier ([place]) 11/25/2016  . Restrictive lung disease 11/08/2016  . COPD (chronic obstructive pulmonary disease) ([place]) 10/25/2016  . Shortness of breath 10/12/2016  . Anemia, macrocytic 12/24/2015  . Frail elderly 11/14/2015  . History of heart artery stent 11/14/2015  . [name] stage G3a/A1, GFR 45-59 and albumin creatinine ratio <30 mg/g ([place]) 11/05/2015  . Facial weakness   . S/P PTCA (percutaneous transluminal coronary angioplasty)   . Hyperlipidemia   . Essential hypertension   . S/P [facility] Feb 2017 10/23/2015  . CAD-S/P remote PCI 20 yrs ago, ? 4 years ago, and s/p RCA PCI/DES 10/22/15 10/23/2015  . Cerebral thrombosis with cerebral infarction 10/23/2015  . DOE (dyspnea on exertion)   . Accelerating angina ([place])   . Hypothyroidism 07/09/2015  . Fatigue due to depression 05/09/2015  . Moderate single current episode of major depressive disorder ([place]) 05/09/2015  . 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  Syncope 04/10/2015  . Faintness   . Retinal detachment, tractional, right eye 03/15/2015  . Hepatic cyst 03/11/2015  . Pacemaker 03/11/2015  . Contracture of ankle and foot joint 03/10/2015  . Drug therapy 03/10/2015  . Dysphagia 03/10/2015  . Edema 03/10/2015  . History of ST elevation myocardial infarction (STEMI) 03/10/2015  . Low back pain 03/10/2015  . Mild cognitive impairment 03/10/2015  . Mediastinal lymphadenopathy 03/10/2015  . Obesity 03/10/2015  . Osteoarthritis 03/10/2015  . Penile  erection impairment 03/10/2015  . Personal history of noncompliance with medical treatment, presenting hazards to health 03/10/2015  . 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 Symptomatic bradycardia 03/04/2015  . History of complete heart block 03/03/2015  . Complete atrioventricular block ([place]) 03/03/2015  . Abnormal chest x-ray 02/19/2015  . Abnormal TSH 02/19/2015  . Anxiety about health 02/19/2015  . CAD in native artery 02/19/2015  . Diastolic CHF ([place]) 78/24/2353  . 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 LVH (left ventricular hypertrophy) 02/19/2015  . Medical non-compliance 02/19/2015  . OSA (obstructive sleep apnea) 02/19/2015  . Prediabetes 02/19/2015  . Rotator cuff tear, right 02/19/2015  . Abnormal computed tomography scan 02/10/2015  . Abnormal CT of the chest 02/10/2015  . Coronary artery disease involving native coronary artery of native heart 01/23/2015  . Non-Q wave myocardial infarction ([place]) 09/09/2011    Past Surgical History:  Procedure Laterality Date  . CARDIAC CATHETERIZATION N/A 10/17/2015   Procedure: Left Heart Cath and Coronary Angiography;  Surgeon: Jettie Booze, MD;  Location: [facility];  Service: Cardiovascular;  Laterality: N/A;  . CARDIAC CATHETERIZATION N/A 10/22/2015   Procedure: Coronary Stent Intervention Rotablater;  Surgeon: Jettie Booze, MD;  Location: [facility];  Service: Cardiovascular;  Laterality: N/A;  . CARDIAC CATHETERIZATION N/A 10/22/2015   Procedure: Left Heart Cath and Coronary Angiography;  Surgeon: Jettie Booze, MD;  Location: [place];  Service: Cardiovascular;  Laterality: N/A;  . CATARACT EXTRACTION W/ INTRAOCULAR LENS  IMPLANT, BILATERAL Bilateral 2016  . CORONARY ANGIOPLASTY  2012  . CORONARY ANGIOPLASTY WITH STENT PLACEMENT  2001  . CORONARY ANGIOPLASTY WITH STENT PLACEMENT     "took 2 out and put 1 longer one in"  . EP IMPLANTABLE DEVICE N/A 03/04/2015    Procedure: Pacemaker Implant;  Surgeon: Will Meredith Leeds, MD;  Location: [place] CV LAB;  Service: Cardiovascular;  Laterality: N/A;  . EP IMPLANTABLE DEVICE N/A 04/10/2015   Procedure: PPM Lead Revision/Repair;  Surgeon: Will Meredith Leeds, MD;  Location: [facility] CV LAB;  Service: Cardiovascular;  Laterality: N/A;  . EYE SURGERY    . INSERT / REPLACE / REMOVE PACEMAKER    . RETINAL DETACHMENT SURGERY Bilateral    "several on each side"       Home Medications    Prior to Admission medications   Medication Sig Start Date End Date Taking? Authorizing Provider  acetaminophen (TYLENOL) 500 MG tablet Take 500 mg by mouth every 6 (six) hours as needed for headache.   Yes [provider]  albuterol (PROAIR HFA) 108 (90 Base) MCG/ACT inhaler Inhale 1-2 puffs into the lungs every 4 (four) hours as needed for shortness of breath.   Yes [provider]  aspirin EC 81 MG tablet Take 81 mg by mouth at bedtime.    Yes [provider]  clopidogrel (PLAVIX) 75 MG tablet Take 1 tablet (75 mg total) by mouth daily. Patient taking differently: Take 75 mg by mouth  at bedtime.  10/20/15  Yes Arbutus Leas, NP  Cyanocobalamin (VITAMIN B-12 PO) Place 1 mL under the tongue daily.   Yes [provider]  diltiazem (CARDIZEM CD) 180 MG 24 hr capsule Take 1 capsule (180 mg total) daily by mouth. Patient taking differently: Take 180 mg at bedtime by mouth.  11/25/16  Yes Camnitz, Will Hassell Done, MD  escitalopram (LEXAPRO) 10 MG tablet Take 1 tablet (10 mg total) at bedtime by mouth. 11/29/16  Yes Rice, Resa Miner, MD  glipiZIDE (GLUCOTROL) 5 MG tablet Take 1 tablet (5 mg total) daily before breakfast by mouth. 11/29/16 12/29/16 Yes Rice, Resa Miner, MD  Glycopyrrolate-Formoterol (BEVESPI AEROSPHERE) 9-4.8 MCG/ACT AERO Inhale 2 puffs into the lungs 2 (two) times daily. 11/08/16  Yes Javier Glazier, MD  isosorbide mononitrate (IMDUR) 30 MG 24 hr tablet Take 1 tablet  (30 mg total) by mouth daily. 10/17/15  Yes Jettie Booze, MD  levothyroxine (SYNTHROID, LEVOTHROID) 25 MCG tablet Take 25 mcg by mouth daily before breakfast.   Yes [provider]  metoprolol succinate (TOPROL-XL) 25 MG 24 hr tablet Take 1 tablet (25 mg total) by mouth daily. Take with or immediately following a meal. 07/26/16  Yes Camnitz, Ocie Doyne, MD  Multiple Vitamin (MULTIVITAMIN WITH MINERALS) TABS tablet Take 1 tablet by mouth daily.    Yes [provider]  nitroGLYCERIN (NITROSTAT) 0.4 MG SL tablet Place 0.4 mg under the tongue daily as needed for chest pain.    Yes [provider]  ramipril (ALTACE) 2.5 MG capsule Take 2.5 mg by mouth at bedtime.    Yes [provider]  torsemide (DEMADEX) 20 MG tablet Take 20 mg by mouth daily.  03/03/16  Yes [provider]  Spacer/Aero Chamber Mouthpiece MISC 1 Device by Does not apply route as directed. 11/08/16   Javier Glazier, MD    Family History Family History  Problem Relation Age of Onset  . Heart disease Father   . Heart disease Mother   . Diabetes Sister   . Cancer Sister   . Diabetes Brother   . Diabetes Sister   . Lung disease Neg Hx     Social History Social History   Tobacco Use  . Smoking status: Former Smoker    Packs/day: 1.00    Years: 11.00    Pack years: 11.00    Types: Cigarettes    Start date: 09/09/1947    Last attempt to quit: 09/09/1958    Years since quitting: 58.2  . Smokeless tobacco: Never Used  . Tobacco comment: Significant second-hand exposure. Wife also smokes around him.  Substance Use Topics  . Alcohol use: Yes    Comment: 11/26/2016 ""nothing since 12/2010"  . Drug use: No     Allergies   Novocain [procaine]; Rosuvastatin calcium; and Statins   Review of Systems Review of Systems  Unable to perform ROS: Other     Physical Exam Updated Vital Signs BP 122/88   Pulse 93   Temp 97.9 F (36.6 C) (Oral)   Resp 19   SpO2 96%    Physical Exam  Constitutional: He is oriented to person, place, and time.  Pleasant, no acute distress  HENT:  Head: Normocephalic and atraumatic.  Eyes: Conjunctivae are normal.  Neck: Neck supple.  Cardiovascular: Normal rate and regular rhythm.  Pulmonary/Chest: Effort normal and breath sounds normal.  Abdominal: Soft. Bowel sounds are normal.  Musculoskeletal: Normal range of motion.  Neurological: He is alert and  oriented to person, place, and time.  Skin: Skin is warm and dry.  Psychiatric: He has a normal mood and affect. His behavior is normal.  Nursing note and vitals reviewed.    ED Treatments / Results  Labs (all labs ordered are listed, but only abnormal results are displayed) Labs Reviewed  BASIC METABOLIC PANEL - Abnormal; Notable for the following components:      Result Value   Sodium 133 (*)    Glucose, Bld 121 (*)    BUN 36 (*)    Creatinine, Ser 1.66 (*)    GFR calc non Af Amer 38 (*)    GFR calc Af Amer 44 (*)    All other components within normal limits  CBC - Abnormal; Notable for the following components:   WBC 16.6 (*)    RBC 4.04 (*)    Hemoglobin 10.4 (*)    HCT 32.6 (*)    MCH 25.7 (*)    RDW 16.5 (*)    All other components within normal limits  I-STAT TROPONIN, ED  I-STAT TROPONIN, ED    EKG  Date: 12/06/2016  Rate: 108  Rhythm: sinus tachy c PAC  QRS Axis: left  Intervals: normal  ST/T Wave abnormalities: normal  Conduction Disutrbances: none  Narrative Interpretation: unremarkable     Radiology Dg Chest Portable 1 View  Result Date: 12/06/2016 CLINICAL DATA:  Shortness of breath EXAM: PORTABLE CHEST 1 VIEW COMPARISON:  11/26/2016 FINDINGS: There is no focal parenchymal opacity. There is no pleural effusion or pneumothorax. The heart and mediastinal contours are stable. There is a dual lead cardiac pacemaker. The osseous structures are unremarkable. IMPRESSION: No active disease. Electronically Signed   By: Kathreen Devoid    On: 12/06/2016 14:34    Procedures Procedures (including critical care time)  Medications Ordered in ED Medications - No data to display   Initial Impression / Assessment and Plan / ED Course  I have reviewed the triage vital signs and the nursing notes.  Pertinent labs & imaging results that were available during my care of the patient were reviewed by me and considered in my medical decision making (see chart for details).     Patient is hemodynamically stable.  He appears in absolutely no respiratory distress.  EKG s of PAC versus a flutter versus atrial tachycardia hows no acute changes.  Chest x-ray negative.  Troponin negative x2.  His pacemaker was interrogated.  Pacemaker tech reports a total of 10-30 minutes/week of PACs versus atrial flutter versus atrial tachycardia.  No episodes noted in the ED.  Patient has primary care and cardiology follow-up.  Results were discussed with the patient and his family.  Final Clinical Impressions(s) / ED Diagnoses   Final diagnoses:  SOB (shortness of breath)    ED Discharge Orders    None       Nat Christen, MD 12/06/16 [facility]

## 2016-12-06 NOTE — ED Triage Notes (Signed)
Pt presents for evaluation of SOB. Pt with similar episode and admission about 10 days ago here. Pt is SOB at rest, denies CP. Has pacemaker/defibrillator.

## 2016-12-08 ENCOUNTER — Ambulatory Visit: Payer: Medicare Other | Admitting: Pulmonary Disease

## 2016-12-08 NOTE — Telephone Encounter (Signed)
Attempted to call pt to check on his status, lm for rtc

## 2016-12-09 ENCOUNTER — Ambulatory Visit: Payer: Medicare Other | Admitting: Pulmonary Disease

## 2016-12-10 ENCOUNTER — Ambulatory Visit (INDEPENDENT_AMBULATORY_CARE_PROVIDER_SITE_OTHER): Payer: Medicare Other | Admitting: Pulmonary Disease

## 2016-12-10 ENCOUNTER — Encounter: Payer: Self-pay | Admitting: Pulmonary Disease

## 2016-12-10 VITALS — BP 134/74 | HR 101 | Wt 233.0 lb

## 2016-12-10 DIAGNOSIS — G4733 Obstructive sleep apnea (adult) (pediatric): Secondary | ICD-10-CM

## 2016-12-10 DIAGNOSIS — J449 Chronic obstructive pulmonary disease, unspecified: Secondary | ICD-10-CM | POA: Diagnosis not present

## 2016-12-10 DIAGNOSIS — K219 Gastro-esophageal reflux disease without esophagitis: Secondary | ICD-10-CM | POA: Diagnosis not present

## 2016-12-10 MED ORDER — RANITIDINE HCL 150 MG PO TABS: 150.0000 mg | ORAL_TABLET | Freq: Two times a day (BID) | ORAL | 3 refills | Status: DC

## 2016-12-10 NOTE — Patient Instructions (Signed)
   You can wear your CPAP and mask during the day while you nap or watch TV to get used to it.  We are starting you on Zantac to help you with your heartburn and abdominal discomfort. If it continues you need to talk with your Internist about getting a workup.  We are keeping you on Bevespi and your other medications.  Call my office if you have any new breathing problems or questions before your next appointment.   Remember to tell your family members to get checked to see if they have Alpha-1 Antitrypsin Deficiency since you are a carrier.    We will see you back in 3 months or sooner if needed.

## 2016-12-10 NOTE — Progress Notes (Signed)
Subjective:    Patient ID: [name], male    DOB: 1940-10-21, 76 y.o.   MRN: [id number]  C.C.:  Follow-up for COPD, OSA, Possible Pulmonary Hypertension,& Moderate Restrictive Lung Disease.   HPI Seen in the emergency department on 11/26. Pacemaker interrogation indicated 10-30 minutes of SVT per week. Previously by by EP during recent hospitalization after last visit were patient was transferred to the emergency department. He is on Cardizem extended release.  COPD: Previously started on Bevespi. Serum workup did show a carrier status for alpha-1 antitrypsin:  FM. He still has some coughing & wheezing. They do feel his dyspnea is worsening.  OSA: Prescribed CPAP at 14 cm H2O with medium-sized fullface mask. He hasn't yet been set up on a CPAP. He   Possible pulmonary hypertension: Suggested with echocardiogram. Previously on diuretic therapy. Not yet confirmed with right heart catheterization.   Moderate Restrictive Lung Disease: Present on lung volumes from 10/23.patient does have a significant reduction in ERV suggesting a significant component from obesity. No imaging to suggest underlying interstitial lung disease.  Review of Systems He does have intermittent chest discomfort and pressure as well as palpitations. He is still having diffuse abdominal pain. No hematochezia with questionable melena. No emesis. He does have frequent eructation. Does have frequent chills but no subjective fever.   Allergies  Allergen Reactions  . Novocain [Procaine] Other (See Comments)    Hallucinations  . Rosuvastatin Calcium Other (See Comments)    Whole body ache/pain  . Statins Other (See Comments)    Whole body ache/pain    Current Outpatient Medications on File Prior to Visit  Medication Sig Dispense Refill  . acetaminophen (TYLENOL) 500 MG tablet Take 500 mg by mouth every 6 (six) hours as needed for headache.    . albuterol (PROAIR HFA) 108 (90 Base) MCG/ACT inhaler Inhale 1-2 puffs  into the lungs every 4 (four) hours as needed for shortness of breath.    Marland Kitchen aspirin EC 81 MG tablet Take 81 mg by mouth at bedtime.     . clopidogrel (PLAVIX) 75 MG tablet Take 1 tablet (75 mg total) by mouth daily. (Patient taking differently: Take 75 mg by mouth at bedtime. ) 30 tablet 12  . Cyanocobalamin (VITAMIN B-12 PO) Place 1 mL under the tongue daily.    Marland Kitchen diltiazem (CARDIZEM CD) 180 MG 24 hr capsule Take 1 capsule (180 mg total) daily by mouth. (Patient taking differently: Take 180 mg at bedtime by mouth. ) 90 capsule 2  . escitalopram (LEXAPRO) 10 MG tablet Take 1 tablet (10 mg total) at bedtime by mouth. 30 tablet 2  . glipiZIDE (GLUCOTROL) 5 MG tablet Take 1 tablet (5 mg total) daily before breakfast by mouth. 30 tablet 0  . Glycopyrrolate-Formoterol (BEVESPI AEROSPHERE) 9-4.8 MCG/ACT AERO Inhale 2 puffs into the lungs 2 (two) times daily. 1 Inhaler 0  . isosorbide mononitrate (IMDUR) 30 MG 24 hr tablet Take 1 tablet (30 mg total) by mouth daily. 30 tablet 11  . levothyroxine (SYNTHROID, LEVOTHROID) 25 MCG tablet Take 25 mcg by mouth daily before breakfast.    . metoprolol succinate (TOPROL-XL) 25 MG 24 hr tablet Take 1 tablet (25 mg total) by mouth daily. Take with or immediately following a meal. 90 tablet 3  . Multiple Vitamin (MULTIVITAMIN WITH MINERALS) TABS tablet Take 1 tablet by mouth daily.     . nitroGLYCERIN (NITROSTAT) 0.4 MG SL tablet Place 0.4 mg under the tongue daily as needed for chest pain.     Marland Kitchen  ramipril (ALTACE) 2.5 MG capsule Take 2.5 mg by mouth at bedtime.     Marland Kitchen Spacer/Aero Chamber Mouthpiece MISC 1 Device by Does not apply route as directed. 1 each 0  . torsemide (DEMADEX) 20 MG tablet Take 20 mg by mouth daily.      No current facility-administered medications on file prior to visit.     Past Medical History:  Diagnosis Date  . Anxiety   . Arthritis    "knees, back, shoulders" (11/26/2016)  . CAD (coronary artery disease)    a. remote stenting >20 years  ago. b. PTCA unknown vessel ~2013. c. 10/2015: s/p rotational atherectomy/DES to distal RCA and RPDA c/b embolic CVA  . CHB (complete heart block) ([place]) 02/2015   [name] 03/03/2015  . Chronic diastolic CHF (congestive heart failure) ([place])   . Chronic shoulder pain    "both shoulders" (11/26/2016)  . [name] (chronic kidney disease), stage III ([place])   . COPD (chronic obstructive pulmonary disease) ([place])   . Depression   . Dilated aortic root ([place])    a. mild by echo 09/2015.  Marland Kitchen GERD (gastroesophageal reflux disease)   . Headache    "weekly" (11/26/2016)  . Heart attack [facility]) 'many years ago'  . High cholesterol   . Hypertension   . Hypothyroidism   . OSA (obstructive sleep apnea)    "suppose to have a mask; they haven't got one adjusted for him yet" (11/26/2016)  . Presence of permanent cardiac pacemaker   . 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 Skin cancer of face    "had it cut off"  . Statin intolerance   . Stroke [facility]) 10/2015   "mini stroke; been having memory issues & confusion ever since" (11/26/2016)  . Symptomatic bradycardia    a. syncope/sinus arrest and bradycardia s/p St. Jude PPM 02/2015 with lead revision 03/2015.    Past Surgical History:  Procedure Laterality Date  . CARDIAC CATHETERIZATION N/A 10/17/2015   Procedure: Left Heart Cath and Coronary Angiography;  Surgeon: Jettie Booze, MD;  Location: [place] [facility];  Service: Cardiovascular;  Laterality: N/A;  . CARDIAC CATHETERIZATION N/A 10/22/2015   Procedure: Coronary Stent Intervention Rotablater;  Surgeon: Jettie Booze, MD;  Location: [place] [facility];  Service: Cardiovascular;  Laterality: N/A;  . CARDIAC CATHETERIZATION N/A 10/22/2015   Procedure: Left Heart Cath and Coronary Angiography;  Surgeon: Jettie Booze, MD;  Location: [place] [facility];  Service: Cardiovascular;  Laterality: N/A;  . CATARACT EXTRACTION W/ INTRAOCULAR LENS  IMPLANT, BILATERAL Bilateral 2016  . CORONARY ANGIOPLASTY  2012  .  CORONARY ANGIOPLASTY WITH STENT PLACEMENT  2001  . CORONARY ANGIOPLASTY WITH STENT PLACEMENT     "took 2 out and put 1 longer one in"  . EP IMPLANTABLE DEVICE N/A 03/04/2015   Procedure: Pacemaker Implant;  Surgeon: Will Meredith Leeds, MD;  Location: [place] [facility];  Service: Cardiovascular;  Laterality: N/A;  . EP IMPLANTABLE DEVICE N/A 04/10/2015   Procedure: PPM Lead Revision/Repair;  Surgeon: Will Meredith Leeds, MD;  Location: [place] [facility];  Service: Cardiovascular;  Laterality: N/A;  . EYE SURGERY    . INSERT / REPLACE / REMOVE PACEMAKER    . RETINAL DETACHMENT SURGERY Bilateral    "several on each side"    Family History  Problem Relation Age of Onset  . Heart disease Father   . Heart disease Mother   . Diabetes Sister   . Cancer Sister   . Diabetes Brother   .  Diabetes Sister   . Lung disease Neg Hx     Social History   Socioeconomic History  . Marital status: Married    Spouse name: None  . Number of children: None  . Years of education: None  . Highest education level: None  Social Needs  . Financial resource strain: None  . Food insecurity - worry: None  . Food insecurity - inability: None  . Transportation needs - medical: None  . Transportation needs - non-medical: None  Occupational History  . None  Tobacco Use  . Smoking status: Former Smoker    Packs/day: 1.00    Years: 11.00    Pack years: 11.00    Types: Cigarettes    Start date: 09/09/1947    Last attempt to quit: 09/09/1958    Years since quitting: 58.2  . Smokeless tobacco: Never Used  . Tobacco comment: Significant second-hand exposure. Wife also smokes around him.  Substance and Sexual Activity  . Alcohol use: Yes    Comment: 11/26/2016 ""nothing since 12/2010"  . Drug use: No  . Sexual activity: Not Currently  Other Topics Concern  . None  Social History Narrative   [facility] (10/25/16):   Originally from [place]. Has lived in [place] most of his life. He was a Company secretary. No  pets currently. No bird exposure. No mold exposure. Previously like going camping. 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 Significant exposure to dust, etc through his work. No known asbestos exposure.       Objective:   Physical Exam BP 134/74 (BP Location: Left Arm, Cuff Size: Normal)   Pulse (!) 101   Wt 233 lb (105.7 kg)   SpO2 98%   BMI 30.74 kg/m   General:  Central obesity. No acute distress. Accompanied by wife today.  Integument:  No rash. Warm. Dry. Extremities:  No cyanosis or clubbing.  HEENT:  No nasal turbinate swelling. Moist his membranes. No scleral icterus Cardiovascular:  Regular rate. Mild bilateral lower extremity edema. Unable to appreciate JVD.  Pulmonary:  Slightly diminished breath sounds at bases but otherwise clear to auscultation. Normal work of breathing on room air. Abdomen: Soft. Normal bowel sounds. Protuberant. Musculoskeletal:  Normal bulk and tone. No joint deformity or effusion appreciated. Neurological:  Cranial nerves 2-12 grossly in tact. No meningismus.   PFT 11/02/16: FVC 2.79 L (60%) FEV1 2.22 L (66%) FEV1/FVC 0.80 FEF 25-75 2.23 L (94%) positive bronchodilator response TLC 3.3 L (50%) RV 49% ERV 19% DLCO corrected 42%  6MWT 11/08/16: Walked 192 meters / Baseline Sat 100% on RA / Nadir Sat 98% on RA   CPAP TITRATION (11/18/16):  Optimal pressure 14 cm H2O. Central apnea index 0.3 events per hour. Desaturation to 86% was noted during titration. No snoring was audible. PVCs were noted on EKG monitoring. Mild periodic limb movements were also observed.  POLYSOMNOGRAM (03/05/16):  Overall AHI 45.4 events/hour with REM AHI  41.5 events/hour & non-REM AHI 45.9 events/hour. Supine AHI was 57.2 events/hour with right-sided AHI 31.8 events/hour. Mean saturation 93.1% with a nadir saturation of 66% during REM sleep. EKG showed pacemaker rhythm with PVCs. Periodic leg movement with arousal index of 3.6 events/hour. Overall RDI was severe  at 63.5 events/hour.  IMAGING PORT CXR 12/06/16 (personally reviewed by me):  Low lung volumes. Chronic elevation of right hemidiaphragm. No focal opacity appreciated. No pleural effusion appreciated.  CXR PA/LAT 10/22/16 (previously reviewed by me):  Chronic elevation of right hemidiaphragm. No focal  opacity appreciated. No pleural effusion or thickening. Implanted device within left anterior chest appreciated with leads projecting to the heart. Heart with cardiomegaly & mediastinum normal in contour.  CT CHEST W/ CONTRAST 03/04/15 (previously reviewed by me):  No central pulmonary emboli appreciated. Pulmonic trunk approximately 3.1 cm by my review. No pathologic mediastinal adenopathy. No pericardial effusion. No parenchymal nodule or opacity appreciated. Minimal atelectasis at the dome of the right hemidiaphragm from compression. Elevation of right hemidiaphragm again noted. Trace bilateral pleural effusions versus pleural thickening with some rounded atelectasis versus trapped pleural fluid in right lateral chest. No pericardial effusion. Radiology also noted hypodensities within the liver consistent with cysts.  CARDIAC TTE (10/13/16):  LV normal in size with moderate LVH. EF 60-65% with no regional wall motion abnormalities & grade 1 diastolic dysfunction. LA & RA normal in size. RV mildly dilated with mild reduction in systolic function. Pulmonary artery systolic pressure 23 mmHg. Trivial aortic regurgitation without stenosis. Aortic root normal in size. No mitral stenosis or regurgitation. No pulmonic regurgitation. Trivial tricuspid regurgitation. No pericardial effusion.  LHC (10/22/15):  Dist RCA lesion, 90 %stenosed. RPDA lesion, 90 %stenosed. Rotational atherectomy was performed on both vessels with 1.5 and 1.75 burrs.  A STENT SYNERGY DES 2.75X28 drug eluting stent was successfully placed- covering both lesions, postdilated with a 3.5 balloon.  Post intervention, there is a 0% residual  stenosis.  LV end diastolic pressure is normal.  There is no aortic valve stenosis.  LHC (10/17/15):  Mid LAD lesion, 80 %stenosed, extending into the Ost 2nd Diag lesion, 90 %stenosed. The diagonal is larger than the continuation of the LAD.  Dist RCA lesion, 90 %stenosed.  RPDA lesion, 90 %stenosed. This appears to be instent restenosis. We do not have records of where his prior stents were placed.  THe RCA and PDA lesions are likely the culprit for his symptoms.  Ost Cx to Mid Cx lesion, 25 %stenosed.  The left ventricular systolic function is normal.  The left ventricular ejection fraction is 55-65% by visual estimate.  There is no aortic valve stenosis.  LABS 11/08/16 Alpha-1 antitrypsin: FM (178)  10/22/16 CBC: 7.4/10.4/33.2/220 BMP: 138/3.2/102/22/14/1.51/174/9.0 LFT: 3.6/7.1/0.7/71/30/19 BNP: 35.1    Assessment & Plan:  76 y.o. male with underlying COPD and obstructive sleep apnea. I spent a significant amount of time today educating the patient on the adverse effects of untreated sleep apnea. Certainly this could be causing some of his short-term memory loss as well as leading to some of his fatigue. We discussed potential ways of desensitizing to CPAP mask and headgear. 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 At this time I feel his intermittent supraventricular tachycardia is the major driving force behind his intermittent periods of dyspnea rather than his underlying COPD. The severity of his COPD is somewhat masked by his low FVC but I suspect it is at least moderate in severity. I instructed the patient and his wife to contact my office if he had any further questions or concerns before his next appointment.  1. COPD: Continuing patient on his current regimen of Bevespi. No new medications. 2. Alpha-1 antitrypsin carrier: Counseled patient to have  biological relatives tested. 3. OSA: Ordering auto titrating CPAP. Plan for download at next appointment. 4. GERD: Starting patient on Zantac 150 mg by mouth daily at bedtime. If dyspepsia persists patient will address this with his PCP.  5. Health maintenance: Plan to address at next appointment. 6. Follow-up:  Return to clinic in 3 months or sooner if needed.   I have spent a total of 34 minutes of time today caring for the patient and  reviewing the patient's electronic medical record with more than 50% of that time spentface-to-face with the patient today.  Sonia Baller Ashok Cordia, M.D. [facility] Pager:  [phone] After 7pm or if no response, call [phone] 3:51 PM 12/10/16

## 2016-12-11 DIAGNOSIS — R410 Disorientation, unspecified: Secondary | ICD-10-CM

## 2016-12-11 HISTORY — DX: Disorientation, unspecified: R41.0

## 2016-12-12 ENCOUNTER — Telehealth: Payer: Self-pay | Admitting: Physician Assistant

## 2016-12-12 NOTE — Telephone Encounter (Signed)
Pt wife called stating that the pt was having trouble breathing and his heart rate was very sporadic. During our phone call he was asymptomatic. However, I told her that if he is having trouble breathing then he needs to come to the ED. She expressed understanding of the plan.

## 2016-12-13 ENCOUNTER — Encounter (HOSPITAL_COMMUNITY): Payer: Self-pay | Admitting: Emergency Medicine

## 2016-12-13 ENCOUNTER — Ambulatory Visit (INDEPENDENT_AMBULATORY_CARE_PROVIDER_SITE_OTHER): Payer: Medicare Other | Admitting: Internal Medicine

## 2016-12-13 ENCOUNTER — Emergency Department (HOSPITAL_COMMUNITY)
Admission: EM | Admit: 2016-12-13 | Discharge: 2016-12-13 | Disposition: A | Discharge status: Home / Self Care | Payer: Medicare Other | Source: Home / Self Care | Attending: Emergency Medicine | Admitting: Emergency Medicine

## 2016-12-13 ENCOUNTER — Observation Stay (HOSPITAL_COMMUNITY): Payer: Medicare Other

## 2016-12-13 ENCOUNTER — Observation Stay (HOSPITAL_COMMUNITY)
Admission: AD | Admit: 2016-12-13 | Discharge: 2016-12-14 | Disposition: A | Discharge status: Home / Self Care | Payer: Medicare Other | Source: Ambulatory Visit | Attending: Internal Medicine | Admitting: Internal Medicine

## 2016-12-13 ENCOUNTER — Ambulatory Visit: Payer: Medicare Other

## 2016-12-13 ENCOUNTER — Other Ambulatory Visit: Payer: Self-pay

## 2016-12-13 ENCOUNTER — Telehealth: Payer: Self-pay | Admitting: *Deleted

## 2016-12-13 ENCOUNTER — Encounter: Payer: Self-pay | Admitting: Internal Medicine

## 2016-12-13 VITALS — BP 114/78 | HR 74 | Temp 98.0°F

## 2016-12-13 DIAGNOSIS — R0602 Shortness of breath: Secondary | ICD-10-CM | POA: Diagnosis not present

## 2016-12-13 DIAGNOSIS — Z79899 Other long term (current) drug therapy: Secondary | ICD-10-CM | POA: Diagnosis not present

## 2016-12-13 DIAGNOSIS — E039 Hypothyroidism, unspecified: Secondary | ICD-10-CM | POA: Insufficient documentation

## 2016-12-13 DIAGNOSIS — J449 Chronic obstructive pulmonary disease, unspecified: Secondary | ICD-10-CM | POA: Insufficient documentation

## 2016-12-13 DIAGNOSIS — Z950 Presence of cardiac pacemaker: Secondary | ICD-10-CM

## 2016-12-13 DIAGNOSIS — M1990 Unspecified osteoarthritis, unspecified site: Secondary | ICD-10-CM | POA: Diagnosis not present

## 2016-12-13 DIAGNOSIS — N183 Chronic kidney disease, stage 3 (moderate): Secondary | ICD-10-CM

## 2016-12-13 DIAGNOSIS — Z85828 Personal history of other malignant neoplasm of skin: Secondary | ICD-10-CM

## 2016-12-13 DIAGNOSIS — N179 Acute kidney failure, unspecified: Secondary | ICD-10-CM | POA: Diagnosis not present

## 2016-12-13 DIAGNOSIS — Z7902 Long term (current) use of antithrombotics/antiplatelets: Secondary | ICD-10-CM

## 2016-12-13 DIAGNOSIS — I5032 Chronic diastolic (congestive) heart failure: Secondary | ICD-10-CM | POA: Insufficient documentation

## 2016-12-13 DIAGNOSIS — Z7982 Long term (current) use of aspirin: Secondary | ICD-10-CM | POA: Insufficient documentation

## 2016-12-13 DIAGNOSIS — I252 Old myocardial infarction: Secondary | ICD-10-CM | POA: Insufficient documentation

## 2016-12-13 DIAGNOSIS — I5030 Unspecified diastolic (congestive) heart failure: Secondary | ICD-10-CM | POA: Diagnosis not present

## 2016-12-13 DIAGNOSIS — I951 Orthostatic hypotension: Secondary | ICD-10-CM

## 2016-12-13 DIAGNOSIS — Z955 Presence of coronary angioplasty implant and graft: Secondary | ICD-10-CM | POA: Insufficient documentation

## 2016-12-13 DIAGNOSIS — K219 Gastro-esophageal reflux disease without esophagitis: Secondary | ICD-10-CM | POA: Insufficient documentation

## 2016-12-13 DIAGNOSIS — R531 Weakness: Secondary | ICD-10-CM

## 2016-12-13 DIAGNOSIS — R0600 Dyspnea, unspecified: Secondary | ICD-10-CM

## 2016-12-13 DIAGNOSIS — I2510 Atherosclerotic heart disease of native coronary artery without angina pectoris: Secondary | ICD-10-CM

## 2016-12-13 DIAGNOSIS — I459 Conduction disorder, unspecified: Secondary | ICD-10-CM

## 2016-12-13 DIAGNOSIS — Z8673 Personal history of transient ischemic attack (TIA), and cerebral infarction without residual deficits: Secondary | ICD-10-CM | POA: Insufficient documentation

## 2016-12-13 DIAGNOSIS — F329 Major depressive disorder, single episode, unspecified: Secondary | ICD-10-CM | POA: Insufficient documentation

## 2016-12-13 DIAGNOSIS — G4733 Obstructive sleep apnea (adult) (pediatric): Secondary | ICD-10-CM | POA: Diagnosis not present

## 2016-12-13 DIAGNOSIS — R0609 Other forms of dyspnea: Secondary | ICD-10-CM | POA: Diagnosis not present

## 2016-12-13 DIAGNOSIS — Z87891 Personal history of nicotine dependence: Secondary | ICD-10-CM | POA: Diagnosis not present

## 2016-12-13 DIAGNOSIS — E1122 Type 2 diabetes mellitus with diabetic chronic kidney disease: Secondary | ICD-10-CM

## 2016-12-13 DIAGNOSIS — Z7984 Long term (current) use of oral hypoglycemic drugs: Secondary | ICD-10-CM | POA: Diagnosis not present

## 2016-12-13 DIAGNOSIS — F419 Anxiety disorder, unspecified: Secondary | ICD-10-CM | POA: Insufficient documentation

## 2016-12-13 DIAGNOSIS — I442 Atrioventricular block, complete: Secondary | ICD-10-CM | POA: Diagnosis not present

## 2016-12-13 DIAGNOSIS — I130 Hypertensive heart and chronic kidney disease with heart failure and stage 1 through stage 4 chronic kidney disease, or unspecified chronic kidney disease: Secondary | ICD-10-CM

## 2016-12-13 DIAGNOSIS — E7800 Pure hypercholesterolemia, unspecified: Secondary | ICD-10-CM | POA: Diagnosis not present

## 2016-12-13 LAB — BASIC METABOLIC PANEL
Anion gap: 9 (ref 5–15)
BUN: 28 mg/dL — AB (ref 6–20)
CO2: 24 mmol/L (ref 22–32)
Calcium: 9.2 mg/dL (ref 8.9–10.3)
Chloride: 101 mmol/L (ref 101–111)
Creatinine, Ser: 1.97 mg/dL — ABNORMAL HIGH (ref 0.61–1.24)
GFR calc Af Amer: 36 mL/min — ABNORMAL LOW (ref >60)
GFR calc non Af Amer: 31 mL/min — ABNORMAL LOW (ref >60)
Glucose, Bld: 105 mg/dL — ABNORMAL HIGH (ref 65–99)
POTASSIUM: 3.7 mmol/L (ref 3.5–5.1)
SODIUM: 134 mmol/L — AB (ref 135–145)

## 2016-12-13 LAB — CBC
HCT: 32.9 % — ABNORMAL LOW (ref 39.0–52.0)
Hemoglobin: 10.3 g/dL — ABNORMAL LOW (ref 13.0–17.0)
MCH: 25.8 pg — AB (ref 26.0–34.0)
MCHC: 31.3 g/dL (ref 30.0–36.0)
MCV: 82.5 fL (ref 78.0–100.0)
PLATELETS: 281 10*3/uL (ref 150–400)
RBC: 3.99 MIL/uL — ABNORMAL LOW (ref 4.22–5.81)
RDW: 17.3 % — AB (ref 11.5–15.5)
WBC: 10.5 10*3/uL (ref 4.0–10.5)

## 2016-12-13 LAB — GLUCOSE, CAPILLARY
GLUCOSE-CAPILLARY: 201 mg/dL — AB (ref 65–99)
Glucose-Capillary: 142 mg/dL — ABNORMAL HIGH (ref 65–99)

## 2016-12-13 MED ORDER — IPRATROPIUM-ALBUTEROL 0.5-2.5 (3) MG/3ML IN SOLN: 3.0000 mL | Freq: Four times a day (QID) | RESPIRATORY_TRACT | Status: DC | PRN

## 2016-12-13 MED ORDER — ALBUTEROL SULFATE (2.5 MG/3ML) 0.083% IN NEBU: 3.0000 mL | INHALATION_SOLUTION | Freq: Four times a day (QID) | RESPIRATORY_TRACT | Status: DC | PRN

## 2016-12-13 MED ORDER — SODIUM CHLORIDE 0.9 % IV SOLN
INTRAVENOUS | Status: AC
  Administered 2016-12-13: 23:00:00 via INTRAVENOUS

## 2016-12-13 MED ORDER — SODIUM CHLORIDE 0.9% FLUSH
3.0000 mL | Freq: Two times a day (BID) | INTRAVENOUS | Status: DC
  Administered 2016-12-13 – 2016-12-14 (×2): 3 mL via INTRAVENOUS

## 2016-12-13 MED ORDER — INSULIN ASPART 100 UNIT/ML ~~LOC~~ SOLN
0.0000 [IU] | Freq: Three times a day (TID) | SUBCUTANEOUS | Status: DC
Start: 2016-12-14 — End: 2016-12-14
  Administered 2016-12-14: 2 [IU] via SUBCUTANEOUS
  Administered 2016-12-14: 1 [IU] via SUBCUTANEOUS

## 2016-12-13 MED ORDER — SODIUM CHLORIDE 0.9 % IV BOLUS (SEPSIS)
500.0000 mL | Freq: Once | INTRAVENOUS | Status: AC
  Administered 2016-12-13: 500 mL via INTRAVENOUS

## 2016-12-13 MED ORDER — LEVOTHYROXINE SODIUM 25 MCG PO TABS
25.0000 ug | ORAL_TABLET | Freq: Every day | ORAL | Status: DC
  Administered 2016-12-14: 25 ug via ORAL
  Filled 2016-12-13: qty 1

## 2016-12-13 MED ORDER — ESCITALOPRAM OXALATE 10 MG PO TABS
10.0000 mg | ORAL_TABLET | Freq: Every day | ORAL | Status: DC
  Administered 2016-12-13 – 2016-12-14 (×2): 10 mg via ORAL
  Filled 2016-12-13 (×2): qty 1

## 2016-12-13 MED ORDER — HEPARIN SODIUM (PORCINE) 5000 UNIT/ML IJ SOLN
5000.0000 [IU] | Freq: Three times a day (TID) | INTRAMUSCULAR | Status: DC
  Administered 2016-12-13 – 2016-12-14 (×3): 5000 [IU] via SUBCUTANEOUS
  Filled 2016-12-13 (×2): qty 1

## 2016-12-13 MED ORDER — CLOPIDOGREL BISULFATE 75 MG PO TABS
75.0000 mg | ORAL_TABLET | Freq: Every day | ORAL | Status: DC
  Administered 2016-12-13 – 2016-12-14 (×2): 75 mg via ORAL
  Filled 2016-12-13 (×2): qty 1

## 2016-12-13 MED ORDER — ASPIRIN 325 MG PO TABS
325.0000 mg | ORAL_TABLET | Freq: Every day | ORAL | Status: DC
  Administered 2016-12-13: 325 mg via ORAL
  Filled 2016-12-13: qty 1

## 2016-12-13 NOTE — ED Triage Notes (Signed)
Pt arrives from home via [facility] from home reporting SOB, denies CP, no new cough, fever/chills. Resp tachypnic, breath sounds clear on arrival.  Pt placed on 2L to try to ease WOB.

## 2016-12-13 NOTE — H&P (Signed)
Date: 12/13/2016               Patient Name:  [name] MRN: [id number]  DOB: [birth year] Age / Sex: 76 y.o., male   PCP: Lars Mage, MD         Medical Service: Internal Medicine Teaching Service         Attending Physician: Dr. Sid Falcon, MD    First Contact: Dr. Ronalee Red Pager: [phone]  Second Contact: Dr. Heber Towner Pager: [phone]       After Hours (After 5p/  First Contact Pager: [phone]  weekends / holidays): Second Contact Pager: [phone]   Chief Complaint: dyspnea on exertion and weakness  History of Present Illness:  [name] is a 76yo male with PMH significant for complete heart block s/p PPM, CAD s/p stenting on aspirin and Plavix, mild MCI, COPD, [name], CVA, and chronic diastolic heart failure who presents as a direct admit from clinic with worsened dyspnea with exertion and weakness this morning. Patient accompanied by wife and niece who helped provide additional history.  He was recently hospitalized 11/16-11/17 for this issue, which wife states is ongoing and episodic. He was started on diltiazem at his last hospitalization. Patient's wife took his BP and BG this morning and states that his BP was ~90/60 and BG was ~160s. She states thatshe gave him all of his morning medications as prescribed, which includes metoprolol, torsemide, isosorbide mononitrate, and levothyroxine. Home PT then came to work with him later in the morning, and he became SOB with exertion. This improved with rest. Home health RN then came to his house and was concerned about his symptoms, so called EMS for transfer to ED. He reportedly went to stand up with EMS but could not, was very weak, and fell backward into the recliner. He did not hit his head or lose consciousness. He was seen in the ED and discharged with plan to titrate down diltiazem at his scheduled clinic appointment at 1pm. He was seen in clinic, where BP was 84/61, HR 95, afebrile, 99% on RA. Orthostatics positive (116/80  HR 61 lying; 95/69 HR 78 sitting; 92/64 HR 109 standing). 500cc bolus given in clinic. Due to concerns for recurrent episodes of dyspnea and ability to care for himself at home, decision was made to admit to IMTS for observation.  Patient does not remember anything that happened earlier today. Patient denies current fevers, chest pain, SOB, cough, abdominal pain, N/V, changes in BMs, dysuria, or leg swelling. Wife was recently admitted to the hospital last week for pneumonia.  Patient typically ambulates with a cane. He is able to dress himself and maintain personal hygiene. He receives assistance with medications from wife and niece.  Meds:  No current facility-administered medications on file prior to encounter.    Current Outpatient Medications on File Prior to Encounter  Medication Sig Dispense Refill  . acetaminophen (TYLENOL) 500 MG tablet Take 500 mg by mouth every 6 (six) hours as needed for headache.    . albuterol (PROAIR HFA) 108 (90 Base) MCG/ACT inhaler Inhale 1-2 puffs into the lungs every 4 (four) hours as needed for shortness of breath.    Marland Kitchen aspirin EC 81 MG tablet Take 81 mg by mouth at bedtime.     . clopidogrel (PLAVIX) 75 MG tablet Take 1 tablet (75 mg total) by mouth daily. (Patient taking differently: Take 75 mg by mouth at bedtime. ) 30 tablet 12  . Cyanocobalamin (VITAMIN B-12 PO) Place 1  mL under the tongue daily.    Marland Kitchen diltiazem (CARDIZEM CD) 180 MG 24 hr capsule Take 1 capsule (180 mg total) daily by mouth. (Patient taking differently: Take 180 mg at bedtime by mouth. ) 90 capsule 2  . escitalopram (LEXAPRO) 10 MG tablet Take 1 tablet (10 mg total) at bedtime by mouth. 30 tablet 2  . glipiZIDE (GLUCOTROL) 5 MG tablet Take 1 tablet (5 mg total) daily before breakfast by mouth. 30 tablet 0  . Glycopyrrolate-Formoterol (BEVESPI AEROSPHERE) 9-4.8 MCG/ACT AERO Inhale 2 puffs into the lungs 2 (two) times daily. 1 Inhaler 0  . isosorbide mononitrate (IMDUR) 30 MG 24 hr tablet  Take 1 tablet (30 mg total) by mouth daily. 30 tablet 11  . levothyroxine (SYNTHROID, LEVOTHROID) 25 MCG tablet Take 25 mcg by mouth daily before breakfast.    . metoprolol succinate (TOPROL-XL) 25 MG 24 hr tablet Take 1 tablet (25 mg total) by mouth daily. Take with or immediately following a meal. 90 tablet 3  . Multiple Vitamin (MULTIVITAMIN WITH MINERALS) TABS tablet Take 1 tablet by mouth daily.     . nitroGLYCERIN (NITROSTAT) 0.4 MG SL tablet Place 0.4 mg under the tongue daily as needed for chest pain.     . ramipril (ALTACE) 2.5 MG capsule Take 2.5 mg by mouth at bedtime.     . ranitidine (ZANTAC) 150 MG tablet Take 1 tablet (150 mg total) by mouth 2 (two) times daily. 30 tablet 3  . Spacer/Aero Chamber Mouthpiece MISC 1 Device by Does not apply route as directed. 1 each 0  . torsemide (DEMADEX) 20 MG tablet Take 20 mg by mouth daily.      Allergies: Allergies as of 12/13/2016 - Review Complete 12/13/2016  Allergen Reaction Noted  . Novocain [procaine] Other (See Comments) 03/04/2015  . Rosuvastatin calcium Other (See Comments) 03/14/2015  . Statins Other (See Comments) 03/04/2015   Past Medical History:  Diagnosis Date  . Anxiety   . Arthritis    "knees, back, shoulders" (11/26/2016)  . CAD (coronary artery disease)    a. remote stenting >20 years ago. b. PTCA unknown vessel ~2013. c. 10/2015: s/p rotational atherectomy/DES to distal RCA and RPDA c/b embolic CVA  . CHB (complete heart block) ([place]) 02/2015   [name] 03/03/2015  . Chronic diastolic CHF (congestive heart failure) ([place])   . Chronic shoulder pain    "both shoulders" (11/26/2016)  . [name] (chronic kidney disease), stage III ([place])   . COPD (chronic obstructive pulmonary disease) ([place])   . Depression   . Dilated aortic root ([place])    a. mild by echo 09/2015.  Marland Kitchen GERD (gastroesophageal reflux disease)   . Headache    "weekly" (11/26/2016)  . Heart attack [facility]) 'many years ago'  . High cholesterol   . Hypertension   .  Hypothyroidism   . OSA (obstructive sleep apnea)    "suppose to have a mask; they haven't got one adjusted for him yet" (11/26/2016)  . Presence of permanent cardiac pacemaker   . 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 Skin cancer of face    "had it cut off"  . Statin intolerance   . Stroke [facility]) 10/2015   "mini stroke; been having memory issues & confusion ever since" (11/26/2016)  . Symptomatic bradycardia    a. syncope/sinus arrest and bradycardia s/p St. Jude PPM 02/2015 with lead revision 03/2015.   Family History:  Family History  Problem Relation Age of Onset  . Heart disease Father   .  Heart disease Mother   . Diabetes Sister   . Cancer Sister   . Diabetes Brother   . Diabetes Sister   . Lung disease Neg Hx    Social History:  - Denies smoking, alcohol, or other illicit drug use - Lives at home with wife and niece - Patient typically ambulates with a cane. He is able to dress himself and maintain personal hygiene. He receives assistance with medications from wife and niece.  Review of Systems: A complete ROS was negative except as per HPI.  Physical Exam: BP 114/78, HR 74, temp 97.5, O2 sat 99% on RA, RR 25  GEN: Elderly male sitting in wheelchair comfortably in NAD. Alert and oriented to name, birthday, and city; not oriented to year or name of hospital (per family, this is around his baseline) HENT: [place]/AT. Dry mucous membranes. No visible lesions. EYES: PERRL. Sclera non-icteric. Conjunctiva clear. RESP: Mild dry bibasilar crackles. No increased work of breathing. CV: Irregular rhythm. No murmurs, gallops, or rubs. 1+ pitting BLE edema to mid-shins ABD: Soft. Non-tender. Non-distended. Normoactive bowel sounds. EXT: 1+ BLE edema to mid-shins. Warm. 2+ radial pulses bilaterally. Capillary refill <2sec. NEURO: Cranial nerves II-XII grossly intact. Able to lift all four extremities against gravity. No apparent audiovisual hallucinations. Speech fluent and appropriate. PSYCH:  Patient is calm and pleasant. Well-groomed; speech is appropriate and on-subject.  Labs CBC Latest Ref Rng & Units 12/13/2016 12/06/2016 11/27/2016  WBC 4.0 - 10.5 K/uL 10.5 16.6(H) 16.5(H)  Hemoglobin 13.0 - 17.0 g/dL 10.3(L) 10.4(L) 9.9(L)  Hematocrit 39.0 - 52.0 % 32.9(L) 32.6(L) 31.9(L)  Platelets 150 - 400 K/uL 281 335 288   BMP Latest Ref Rng & Units 12/13/2016 12/06/2016 11/27/2016  Glucose 65 - 99 mg/dL 105(H) 121(H) 180(H)  BUN 6 - 20 mg/dL 28(H) 36(H) 29(H)  Creatinine 0.61 - 1.24 mg/dL 1.97(H) 1.66(H) 1.54(H)  Sodium 135 - 145 mmol/L 134(L) 133(L) 136  Potassium 3.5 - 5.1 mmol/L 3.7 3.5 4.2  Chloride 101 - 111 mmol/L 101 102 102  CO2 22 - 32 mmol/L 24 22 23   Calcium 8.9 - 10.3 mg/dL 9.2 9.0 9.4   EKG: Sinus rhythm, supraventricular bigeminy  Assessment & Plan by Problem: Active Problems:   Orthostatic hypotension  Mr. Casillas is a 76yo male with PMH significant for complete heart block s/p PPM, CAD s/p stenting on aspirin and Plavix, mild MCI, COPD, [name], CVA, and chronic diastolic heart failure who presents with recurrent dyspnea with exertion and weakness. Has had multiple prior admissions and clinic visits addressing this chronic and episodic issue.   Recurrent dyspnea and weakness Likely multifactorial secondary to COPD, untreated OSA, diastolic HF, CAD, and heart block, and exacerbated by dehydration and low BP. Orthostatics positive in the clinic. CBC without leukocytosis. Afebrile and VSS. O2 sats remaining >93% on RA. Unlikely to be infectious as patient does not have s/s of infectious process, however with his age and recent sick contact, will check imaging. S/p 500cc bolus - Telemetry - IV NS 137mL/hr x12hr - Will need outpatient cardiology and EP follow up - F/u CXR - CBC and BMET in AM - Orthostatics in AM - albuterol and duonebs PRN  Orthostatic hypotension BP 84/61 and positive orthostatics in clinic. S/p 500cc bolus. - Hold home BP medications in setting  of hypotension  AKI on [name] Cr today was 1.97 (baseline ~1.4-1.7). Likely pre-renal etiology secondary to dehydration. - BMET in AM  Diastolic HF, CAD s/p stents, complete heart block s/p St Jude PPM  Last echo 10/13/2016 with normal LVEF 60-65% and grade 1 DD. Most recent stent 2017, on aspirin and Plavix. Follows with Cardiology and EP outpatient. - Continue home aspirin and plavix - Holding home BP medications in setting of hypotension  COPD, possible pulmonary HTN, and moderate restrictive lung disease Recently seen by Dr. Ashok Cordia, his pulmonologist. 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 Possible pulmonary HTN, suggested with echocardiogram. Not confirmed with [facility]. PFTs in October 2018 showed moderate restrictive lung disease, possibly with some component secondary to obesity. CT chest in 2017 without evidence of interstitial lung disease. - Continue home bevespi  OSA Untreated OSA, however was recently seen by Pulmonology where he was noted to require CPAP.  DM Recently diagnosed. A1c 7.7 on 11/17. BG 105 on admission. On glipizide outpatient. - CBG monitoring - SSI-S  Diet: HH/carb modified VTE PPx: SQH Dispo: Admit patient to Observation with expected length of stay less than 2 midnights.  Signed: Colbert Ewing, MD 12/13/2016, 5:56 PM  Pager: Mamie Nick [phone]

## 2016-12-13 NOTE — ED Provider Notes (Signed)
[facility] EMERGENCY DEPARTMENT Provider Note   CSN: [id number] Arrival date & time: 12/13/16  1156     History   Chief Complaint Chief Complaint  Patient presents with  . Shortness of Breath    HPI [name] is a 76 y.o. male.  Patient presenting for weakness, which made it difficult to walk, which started today.  Family members state they called EMS, because he was too weak to get in a car and drive, to his physician appointment, scheduled for today at 1 PM.  He is continuing to take all of his medications as prescribed.  His pulmonologist is working with him to get a CPAP machine started because of sleep apnea.  If there is been no recent change in his ongoing symptoms of intermittent shortness of breath, and palpitations.  Both of these have been recently comprehensively evaluated including during the admission 2 weeks ago.  There is been no recent fever, chills, nausea, vomiting, headaches, back pain, chest pain, focal weakness or paresthesia.  There are no other known modifying factors.  HPI  Past Medical History:  Diagnosis Date  . Anxiety   . Arthritis    "knees, back, shoulders" (11/26/2016)  . CAD (coronary artery disease)    a. remote stenting >20 years ago. b. PTCA unknown vessel ~2013. c. 10/2015: s/p rotational atherectomy/DES to distal RCA and RPDA c/b embolic CVA  . CHB (complete heart block) ([place]) 02/2015   [name] 03/03/2015  . Chronic diastolic CHF (congestive heart failure) ([place])   . Chronic shoulder pain    "both shoulders" (11/26/2016)  . [name] (chronic kidney disease), stage III ([place])   . COPD (chronic obstructive pulmonary disease) ([place])   . Depression   . Dilated aortic root ([place])    a. mild by echo 09/2015.  Marland Kitchen GERD (gastroesophageal reflux disease)   . Headache    "weekly" (11/26/2016)  . Heart attack [facility]) 'many years ago'  . High cholesterol   . Hypertension   . Hypothyroidism   . OSA (obstructive sleep apnea)    "suppose to have a mask; they haven't got one adjusted for him yet" (11/26/2016)  . Presence of permanent cardiac pacemaker   . 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 Skin cancer of face    "had it cut off"  . Statin intolerance   . Stroke [facility]) 10/2015   "mini stroke; been having memory issues & confusion ever since" (11/26/2016)  . Symptomatic bradycardia    a. syncope/sinus arrest and bradycardia s/p St. Jude PPM 02/2015 with lead revision 03/2015.    Patient Active Problem List   Diagnosis Date Noted  . Type 2 diabetes mellitus without complication, without long-term current use of insulin ([place]) 11/29/2016  . SVT (supraventricular tachycardia) ([place]) 11/26/2016  . Alpha-1-antitrypsin deficiency carrier ([place]) 11/25/2016  . Restrictive lung disease 11/08/2016  . COPD (chronic obstructive pulmonary disease) ([place]) 10/25/2016  . Shortness of breath 10/12/2016  . Anemia, macrocytic 12/24/2015  . Frail elderly 11/14/2015  . History of heart artery stent 11/14/2015  . [name] stage G3a/A1, GFR 45-59 and albumin creatinine ratio <30 mg/g ([place]) 11/05/2015  . Facial weakness   . S/P PTCA (percutaneous transluminal coronary angioplasty)   . Hyperlipidemia   . Essential hypertension   . S/P [facility] Feb 2017 10/23/2015  . CAD-S/P remote PCI 20 yrs ago, ? 4 years ago, and s/p RCA PCI/DES 10/22/15 10/23/2015  . Cerebral thrombosis with cerebral infarction 10/23/2015  . DOE (dyspnea on exertion)   .  Accelerating angina ([place])   . Hypothyroidism 07/09/2015  . Fatigue due to depression 05/09/2015  . Moderate single current episode of major depressive disorder ([place]) 05/09/2015  . 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 Syncope 04/10/2015  . Faintness   . Retinal detachment, tractional, right eye 03/15/2015  . Hepatic cyst 03/11/2015  . Pacemaker 03/11/2015  . Contracture of ankle and foot joint 03/10/2015  . Drug therapy 03/10/2015  . Dysphagia 03/10/2015  . Edema 03/10/2015  . History  of ST elevation myocardial infarction (STEMI) 03/10/2015  . Low back pain 03/10/2015  . Mild cognitive impairment 03/10/2015  . Mediastinal lymphadenopathy 03/10/2015  . Obesity 03/10/2015  . Osteoarthritis 03/10/2015  . 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 Personal history of noncompliance with medical treatment, presenting hazards to health 03/10/2015  . 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 Symptomatic bradycardia 03/04/2015  . History of complete heart block 03/03/2015  . Complete atrioventricular block ([place]) 03/03/2015  . Abnormal chest x-ray 02/19/2015  . Abnormal TSH 02/19/2015  . Anxiety about health 02/19/2015  . CAD in native artery 02/19/2015  . Diastolic CHF ([place]) 47/42/5956  . 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 LVH (left ventricular hypertrophy) 02/19/2015  . Medical non-compliance 02/19/2015  . OSA (obstructive sleep apnea) 02/19/2015  . Prediabetes 02/19/2015  . Rotator cuff tear, right 02/19/2015  . Abnormal computed tomography scan 02/10/2015  . Abnormal CT of the chest 02/10/2015  . Coronary artery disease involving native coronary artery of native heart 01/23/2015  . Non-Q wave myocardial infarction ([place]) 09/09/2011    Past Surgical History:  Procedure Laterality Date  . CARDIAC CATHETERIZATION N/A 10/17/2015   Procedure: Left Heart Cath and Coronary Angiography;  Surgeon: Jettie Booze, MD;  Location: [place] CV LAB;  Service: Cardiovascular;  Laterality: N/A;  . CARDIAC CATHETERIZATION N/A 10/22/2015   Procedure: Coronary Stent Intervention Rotablater;  Surgeon: Jettie Booze, MD;  Location: [place] CV LAB;  Service: Cardiovascular;  Laterality: N/A;  . CARDIAC CATHETERIZATION N/A 10/22/2015   Procedure: Left Heart Cath and Coronary Angiography;  Surgeon: Jettie Booze, MD;  Location: [place] CV LAB;  Service: Cardiovascular;  Laterality: N/A;  . CATARACT EXTRACTION W/ INTRAOCULAR LENS  IMPLANT, BILATERAL  Bilateral 2016  . CORONARY ANGIOPLASTY  2012  . CORONARY ANGIOPLASTY WITH STENT PLACEMENT  2001  . CORONARY ANGIOPLASTY WITH STENT PLACEMENT     "took 2 out and put 1 longer one in"  . EP IMPLANTABLE DEVICE N/A 03/04/2015   Procedure: Pacemaker Implant;  Surgeon: Will Meredith Leeds, MD;  Location: [place] CV LAB;  Service: Cardiovascular;  Laterality: N/A;  . EP IMPLANTABLE DEVICE N/A 04/10/2015   Procedure: PPM Lead Revision/Repair;  Surgeon: Will Meredith Leeds, MD;  Location: [place] CV LAB;  Service: Cardiovascular;  Laterality: N/A;  . EYE SURGERY    . INSERT / REPLACE / REMOVE PACEMAKER    . RETINAL DETACHMENT SURGERY Bilateral    "several on each side"       Home Medications    Prior to Admission medications   Medication Sig Start Date End Date Taking? Authorizing Provider  acetaminophen (TYLENOL) 500 MG tablet Take 500 mg by mouth every 6 (six) hours as needed for headache.    [provider]  albuterol (PROAIR HFA) 108 (90 Base) MCG/ACT inhaler Inhale 1-2 puffs into the lungs every 4 (four) hours as needed for shortness of breath.    [provider]  aspirin EC 81 MG tablet  Take 81 mg by mouth at bedtime.     [provider]  clopidogrel (PLAVIX) 75 MG tablet Take 1 tablet (75 mg total) by mouth daily. Patient taking differently: Take 75 mg by mouth at bedtime.  10/20/15   Arbutus Leas, NP  Cyanocobalamin (VITAMIN B-12 PO) Place 1 mL under the tongue daily.    [provider]  diltiazem (CARDIZEM CD) 180 MG 24 hr capsule Take 1 capsule (180 mg total) daily by mouth. Patient taking differently: Take 180 mg at bedtime by mouth.  11/25/16   Camnitz, Ocie Doyne, MD  escitalopram (LEXAPRO) 10 MG tablet Take 1 tablet (10 mg total) at bedtime by mouth. 11/29/16   Rice, Resa Miner, MD  glipiZIDE (GLUCOTROL) 5 MG tablet Take 1 tablet (5 mg total) daily before breakfast by mouth. 11/29/16 12/29/16  Rice, Resa Miner, MD    Glycopyrrolate-Formoterol (BEVESPI AEROSPHERE) 9-4.8 MCG/ACT AERO Inhale 2 puffs into the lungs 2 (two) times daily. 11/08/16   Javier Glazier, MD  isosorbide mononitrate (IMDUR) 30 MG 24 hr tablet Take 1 tablet (30 mg total) by mouth daily. 10/17/15   Jettie Booze, MD  levothyroxine (SYNTHROID, LEVOTHROID) 25 MCG tablet Take 25 mcg by mouth daily before breakfast.    [provider]  metoprolol succinate (TOPROL-XL) 25 MG 24 hr tablet Take 1 tablet (25 mg total) by mouth daily. Take with or immediately following a meal. 07/26/16   Camnitz, Ocie Doyne, MD  Multiple Vitamin (MULTIVITAMIN WITH MINERALS) TABS tablet Take 1 tablet by mouth daily.     [provider]  nitroGLYCERIN (NITROSTAT) 0.4 MG SL tablet Place 0.4 mg under the tongue daily as needed for chest pain.     [provider]  ramipril (ALTACE) 2.5 MG capsule Take 2.5 mg by mouth at bedtime.     [provider]  ranitidine (ZANTAC) 150 MG tablet Take 1 tablet (150 mg total) by mouth 2 (two) times daily. 12/10/16   Javier Glazier, MD  Spacer/Aero Chamber Mouthpiece MISC 1 Device by Does not apply route as directed. 11/08/16   Javier Glazier, MD  torsemide (DEMADEX) 20 MG tablet Take 20 mg by mouth daily.  03/03/16   [provider]    Family History Family History  Problem Relation Age of Onset  . Heart disease Father   . Heart disease Mother   . Diabetes Sister   . Cancer Sister   . Diabetes Brother   . Diabetes Sister   . Lung disease Neg Hx     Social History Social History   Tobacco Use  . Smoking status: Former Smoker    Packs/day: 1.00    Years: 11.00    Pack years: 11.00    Types: Cigarettes    Start date: 09/09/1947    Last attempt to quit: 09/09/1958    Years since quitting: 58.3  . Smokeless tobacco: Never Used  . Tobacco comment: Significant second-hand exposure. Wife also smokes around him.  Substance Use Topics  . Alcohol use: Yes    Comment:  11/26/2016 ""nothing since 12/2010"  . Drug use: No     Allergies   Novocain [procaine]; Rosuvastatin calcium; and Statins   Review of Systems Review of Systems  All other systems reviewed and are negative.    Physical Exam Updated Vital Signs BP 103/70   Pulse 80   Temp (!) 97.5 F (36.4 C) (Oral)   Resp (!) 25   SpO2 97%   Physical  Exam  Constitutional: He is oriented to person, place, and time. He appears well-developed. He does not appear ill.  Elderly, frail  HENT:  Head: Normocephalic and atraumatic.  Right Ear: External ear normal.  Left Ear: External ear normal.  Eyes: Conjunctivae and EOM are normal. Pupils are equal, round, and reactive to light.  Neck: Normal range of motion and phonation normal. Neck supple.  Cardiovascular: Normal rate, regular rhythm and normal heart sounds.  Pulmonary/Chest: Effort normal and breath sounds normal. He exhibits no bony tenderness.  Abdominal: Soft. There is no tenderness.  Musculoskeletal: Normal range of motion.  Neurological: He is alert and oriented to person, place, and time. No cranial nerve deficit or sensory deficit. He exhibits normal muscle tone. Coordination normal.  No dysarthria or aphasia  Skin: Skin is warm, dry and intact.  Psychiatric: He has a normal mood and affect. His behavior is normal. Judgment and thought content normal.  Nursing note and vitals reviewed.    ED Treatments / Results  Labs (all labs ordered are listed, but only abnormal results are displayed) Labs Reviewed - No data to display  EKG  EKG Interpretation None       Radiology No results found.  Procedures Procedures (including critical care time)  Medications Ordered in ED Medications - No data to display   Initial Impression / Assessment and Plan / ED Course  I have reviewed the triage vital signs and the nursing notes.  Pertinent labs & imaging results that were available during my care of the patient were reviewed  by me and considered in my medical decision making (see chart for details).      Patient Vitals for the past 24 hrs:  BP Temp Temp src Pulse Resp SpO2  12/13/16 1245 103/70 - - 80 (!) 25 97 %  12/13/16 1230 (!) 104/59 - - 68 (!) 26 99 %  12/13/16 1221 97/67 - - (!) 45 19 100 %  12/13/16 1201 102/66 (!) 97.5 F (36.4 C) Oral (!) 51 (!) 40 97 %  12/13/16 1157 - - - - - 98 %    12:43 PM Reevaluation with update and discussion. After initial assessment and treatment, an updated evaluation reveals no change in clinical status.  Findings discussed with patient and his wife, they are agreeable to going to his clinic appointment, in 17 minutes (downstairs), all questions answered. Daleen Bo      Final Clinical Impressions(s) / ED Diagnoses   Final diagnoses:  Weakness    ED Discharge Orders    None     Nonspecific weakness, with low blood pressure initially, improved with rest.  Patient recently comprehensively evaluated, and hospitalized.  He was started on Cardizem 180 mg extended release, 2 weeks ago.  He takes it in the evening.  It appears that this medication has lowered his blood pressure, and led to weakness.  This medication needs to be titrated lower for tolerance.  Patient has an appointment at 1 PM today, at his 62 office, and the internal medicine outpatient clinic.  He was discharged to go there, directly, at 12:45 PM.  Nursing Notes Reviewed/ Care Coordinated Applicable Imaging Reviewed Interpretation of Laboratory Data incorporated into ED treatment  The patient appears reasonably screened and/or stabilized for discharge and I doubt any other medical condition or other [facility] requiring further screening, evaluation, or treatment in the ED at this time prior to discharge.  Plan: Home Medications-consider titrating Cardizem down, versus changing to another agent, continue other  medications; Home Treatments-rest, fluids, start CPAP as soon as possible; return here  if the recommended treatment, does not improve the symptoms; Recommended follow up-outpatient clinic 1 PM today.    Daleen Bo, MD 12/13/16 [phone]

## 2016-12-13 NOTE — Assessment & Plan Note (Signed)
Assessment: Orthostatics were positive in the clinic and he was given a 500 cc bolus.  Plan: - Admit to IMTS for further fluid management - He has already taken his torsemide and metoprolol this morning - Further adjustments will be deferred to the inpatient service.

## 2016-12-13 NOTE — Progress Notes (Signed)
CC: dyspnea on Exertion, fatigue  HPI:  [name] is a 76 y.o. man with extensive PMHx as detailed below who presents from the ED due to concern for increased dyspnea on exertion and fatigue. He was recently hospitalized last month for this issue which has been an ongoing and episodic problem.  He has a hx of COPD, untreated OSA, diastolic HF, CAD, heart block with PPM that could all be contributing to his symptoms.  He was started on diltiazem during his last hospitalization.  His wife reports he has been tolerating this well and he otherwise continues to take metoprolol, torsemide, and ramipril daily.  This morning, he was working with his [facility] PT when he got very SOB and fatigued.  Symptoms improved a little with rest.  His HH RN then came to the house and due to their concerns called EMS for transfer to the ED.  His wife reports this was an acute change for him.  His CBG at home was in the 160-260 range this morning.  He was brought to clinic where his BP was 84/61, HR 95, afebrile, 99% on room air.  EKG with rate 96, SV bigeminy and Left axis deviation.  ORthostatics were positive (116/80 HR 61 lying, 95/69 HR 78 sitting, and 92/64 HR 109 standing).  Stat BMET and CBC were obtained and he was given a 500cc bolus in the clinic.  Due to concerns for his tenuous cardiopulmonary status and ability to care for himself in the home, he will be admitted to IMTS for observation.  On ROS, he reports DOE and abdominal discomfort.  Denies chest pain currently, cough, fever, chills, syncope, bleeding.  No focal neurologic deficits.  Past Medical History:  Diagnosis Date  . Anxiety   . Arthritis    "knees, back, shoulders" (11/26/2016)  . CAD (coronary artery disease)    a. remote stenting >20 years ago. b. PTCA unknown vessel ~2013. c. 10/2015: s/p rotational atherectomy/DES to distal RCA and RPDA c/b embolic CVA  . CHB (complete heart block) ([place]) 02/2015   [name] 03/03/2015  . Chronic diastolic  CHF (congestive heart failure) ([place])   . Chronic shoulder pain    "both shoulders" (11/26/2016)  . [name] (chronic kidney disease), stage III ([place])   . COPD (chronic obstructive pulmonary disease) ([place])   . Depression   . Dilated aortic root ([place])    a. mild by echo 09/2015.  Marland Kitchen GERD (gastroesophageal reflux disease)   . Headache    "weekly" (11/26/2016)  . Heart attack [facility] 'many years ago'  . High cholesterol   . Hypertension   . Hypothyroidism   . OSA (obstructive sleep apnea)    "suppose to have a mask; they haven't got one adjusted for him yet" (11/26/2016)  . Presence of permanent cardiac pacemaker   . 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 Skin cancer of face    "had it cut off"  . Statin intolerance   . Stroke [facility]) 10/2015   "mini stroke; been having memory issues & confusion ever since" (11/26/2016)  . Symptomatic bradycardia    a. syncope/sinus arrest and bradycardia s/p St. Jude PPM 02/2015 with lead revision 03/2015.   Review of Systems:  Please see pertinent ROS reviewed in HPI and problem based charting.   Physical Exam:  Vitals:   12/13/16 1324  BP: (!) 84/61  Pulse: 95  Temp: 98 F (36.7 C)  TempSrc: Oral  SpO2: 99%   General: sitting up in chair, NAD, fatigued, elderly  HEENT: NCAT, EOMI, no scleral icterus Neck: no JVD noted Cardiac: irregular rhythm, normal rate Pulm: no distress, possible crackles at bases vs coarse breath sounds Abd: soft, nontender, nondistended, BS present Ext: warm and well perfused, no pedal edema Neuro: alert and oriented X3, cranial nerves II-XII grossly intact   Assessment & Plan:   See Encounters Tab for problem based charting.  Patient seen with Dr. Dareen Piano.  Orthostatic hypotension Assessment: Orthostatics were positive in the clinic and he was given a 500 cc bolus.  Plan: - Admit to IMTS for further fluid management - He has already taken his torsemide and metoprolol this morning - Further adjustments will be deferred to  the inpatient service.  DOE (dyspnea on exertion) Assessment: He has ongoing and episodic DOE for quite some time but had an acute change this morning according to his wife which may be more attributable to his hypotension and positive orthostatics.  His chronic DOE is likely multifactorial due to COPD, CHF, OSA, CAD. He does not appear overloaded on exam this afternoon and he is saturating 99% on room air.  Plan: - BMET and CBC ordered to assess renal function and status of chronic anemia - Consider CXR to assess for infection as source of new onset symptoms - He has plans to follow up with cardiology/EP and was recently seen in the pulmonology clinic without change to his COPD regimen

## 2016-12-13 NOTE — Assessment & Plan Note (Addendum)
Assessment: He has ongoing and episodic DOE for quite some time but had an acute change this morning according to his wife which may be more attributable to his hypotension and positive orthostatics.  His chronic DOE is likely multifactorial due to COPD, CHF, OSA, CAD. He does not appear overloaded on exam this afternoon and he is saturating 99% on room air.  Plan: - BMET and CBC ordered to assess renal function and status of chronic anemia - Consider CXR to assess for infection as source of new onset symptoms - He has plans to follow up with cardiology/EP and was recently seen in the pulmonology clinic without change to his COPD regimen

## 2016-12-13 NOTE — Progress Notes (Signed)
Internal Medicine Clinic Attending  I saw and evaluated the patient.  I personally confirmed the key portions of the history and exam documented by Dr. Wallace and I reviewed pertinent patient test results.  The assessment, diagnosis, and plan were formulated together and I agree with the documentation in the resident's note. 

## 2016-12-13 NOTE — Discharge Instructions (Signed)
Go to your appointment, now, at the outpatient clinic, to be seen at 1 PM.

## 2016-12-13 NOTE — Telephone Encounter (Signed)
Wife calls and states pt is on the way to ED via EMS. States pt is very weak.

## 2016-12-14 ENCOUNTER — Encounter (HOSPITAL_COMMUNITY): Payer: Self-pay | Admitting: General Practice

## 2016-12-14 DIAGNOSIS — I951 Orthostatic hypotension: Principal | ICD-10-CM

## 2016-12-14 DIAGNOSIS — R0602 Shortness of breath: Secondary | ICD-10-CM

## 2016-12-14 LAB — BASIC METABOLIC PANEL
Anion gap: 8 (ref 5–15)
BUN: 23 mg/dL — AB (ref 6–20)
CHLORIDE: 104 mmol/L (ref 101–111)
CO2: 23 mmol/L (ref 22–32)
Calcium: 8.6 mg/dL — ABNORMAL LOW (ref 8.9–10.3)
Creatinine, Ser: 1.53 mg/dL — ABNORMAL HIGH (ref 0.61–1.24)
GFR calc Af Amer: 48 mL/min — ABNORMAL LOW (ref >60)
GFR calc non Af Amer: 42 mL/min — ABNORMAL LOW (ref >60)
Glucose, Bld: 116 mg/dL — ABNORMAL HIGH (ref 65–99)
POTASSIUM: 4 mmol/L (ref 3.5–5.1)
SODIUM: 135 mmol/L (ref 135–145)

## 2016-12-14 LAB — CBC
HCT: 29.2 % — ABNORMAL LOW (ref 39.0–52.0)
HEMATOCRIT: 27.4 % — AB (ref 39.0–52.0)
HEMOGLOBIN: 9.1 g/dL — AB (ref 13.0–17.0)
Hemoglobin: 8.6 g/dL — ABNORMAL LOW (ref 13.0–17.0)
MCH: 25.7 pg — ABNORMAL LOW (ref 26.0–34.0)
MCH: 25.7 pg — AB (ref 26.0–34.0)
MCHC: 31.4 g/dL (ref 30.0–36.0)
MCHC: 31.2 g/dL (ref 30.0–36.0)
MCV: 81.8 fL (ref 78.0–100.0)
MCV: 82.5 fL (ref 78.0–100.0)
PLATELETS: 198 10*3/uL (ref 150–400)
Platelets: 193 10*3/uL (ref 150–400)
RBC: 3.35 MIL/uL — AB (ref 4.22–5.81)
RBC: 3.54 MIL/uL — AB (ref 4.22–5.81)
RDW: 16.9 % — ABNORMAL HIGH (ref 11.5–15.5)
RDW: 16.9 % — ABNORMAL HIGH (ref 11.5–15.5)
WBC: 6.2 10*3/uL (ref 4.0–10.5)
WBC: 7.4 10*3/uL (ref 4.0–10.5)

## 2016-12-14 LAB — GLUCOSE, CAPILLARY
GLUCOSE-CAPILLARY: 121 mg/dL — AB (ref 65–99)
GLUCOSE-CAPILLARY: 156 mg/dL — AB (ref 65–99)

## 2016-12-14 MED ORDER — ASPIRIN EC 81 MG PO TBEC: 81.0000 mg | DELAYED_RELEASE_TABLET | Freq: Every day | ORAL | Status: DC

## 2016-12-14 MED ORDER — METOPROLOL SUCCINATE ER 50 MG PO TB24
50.0000 mg | ORAL_TABLET | Freq: Every day | ORAL | Status: DC
  Administered 2016-12-14: 50 mg via ORAL
  Filled 2016-12-14: qty 1

## 2016-12-14 MED ORDER — METOPROLOL SUCCINATE ER 25 MG PO TB24: 50.0000 mg | ORAL_TABLET | Freq: Every day | ORAL | 0 refills | Status: AC

## 2016-12-14 NOTE — Progress Notes (Addendum)
  Date: 12/14/2016  Patient name: [name]  Medical record number: [id number]  Date of birth: March 22, 1940   I have seen and evaluated [name] and discussed their care with the Residency Team. Briefly, [name] is a 76yo man with PMH of SSS, complete heart block, PPM in place, CAD on aspirin and plavix, mild MCI, COPD, [name], h/o CVA, chronic diastolic CHF who presented for DOE and weakness first to the ED and then to clinic on 12/3. This has apparently been a chronic, ongoing issue for the patient and is intermittent.  He has been admitted in November of this year and started on diltiazem for DOE and intermittent chest pain due to findings of intermittent tachyarrhythmias via his pacemaker.  He has not yet followed up with Cardiology.  He has many diagnoses that can contribute to his symptoms include COPD, OSA (not on CPAP), CAD and his arrhythmia.  On this occasion, he was noted to have a lower side BP in the AM and received his home medications.  When PT and RN came to evaluate him he was weak and dizzy and he was subsequently orthostatic positive in the clinic.  He has since received a 500cc bolus in the clinic and some maintenance fluid overnight and when I saw him this morning he looked very good.  He had no complaints, no chest pain and was eating breakfast and telling jokes.  He had ambulated with assistance to his chair.  He was awaiting PT evaluation.    Vitals:   12/14/16 0456 12/14/16 0730  BP: 133/87 126/66  Pulse: 60 62  Resp: 18 18  Temp: 98.3 F (36.8 C) 98.4 F (36.9 C)  SpO2: 98% 97%   Physical Exam:  Gen: Alert, awake, sitting in chair eating breakfast Eyes: Anicteric sclerae CV: Irreg Irreg, NR, No murmurs, minimal LE edema, pulses intact in radial arteries bilaterally Pulm: Fine crackles at bases, otherwise no wheezing or dyspnea Abd: Soft, NT, +BS Ext: Mild LE edema Skin: Changes associated with age, some mild bruising on arms Psych: Calm and  conversant  Assessment and Plan: I have seen and evaluated the patient as outlined above. I agree with the formulated Assessment and Plan as detailed in the residents' note, with the following changes:   1. Orthostatic hypotension - Possibly related to medications, we are going to hold the diltiazem and gave some fluids back overnight and today.  He is now eating - Will discuss hold parameters with family, consider d/cing diltiazem completely on discharge - Improved with fluids  2. Dyspnea on Exertion, weakness - Unclear etiology, but likely multifactorial - Continue current therapy, except holding diltiazem - Cardiology follow up outpatient - Albuterol/duoneb PRN - PT/OT evaluation - A drop in Hgb was noted this AM, but all cell lines also dropped in the setting of IVF - Recheck CBC this afternoon - Consider causes of GI bleeding if CBC continues to drop.   3. AKI - Improved with fluids to baseline, monitor  Other issues per Dr. Allayne Gitelman daily note.    Sid Falcon, MD 12/4/20189:50 AM

## 2016-12-14 NOTE — Progress Notes (Addendum)
Subjective:  Mr. Duesing was seen sitting in his chair comfortably eating breakfast. He denies chest pain, shortness of breath, dizziness or abdominal pain currently. Orthostatics negative this morning. CBC decreased to 8.6, however, given other CBC counts, this is likely dilutional secondary to IVF administration.  Objective:  Vital signs in last 24 hours: Vitals:   12/13/16 1758 12/13/16 1944 12/14/16 0024 12/14/16 0456  BP: 133/74 105/61 117/75 133/87  Pulse: 73 65 62 60  Resp: 18 18 18 18   Temp: 97.7 F (36.5 C) 98.3 F (36.8 C) 98.2 F (36.8 C) 98.3 F (36.8 C)  TempSrc: Oral Oral Oral Oral  SpO2: 94% 97% 98% 98%  Weight: 218 lb 1.6 oz (98.9 kg)   219 lb 3.2 oz (99.4 kg)  Height: 6' (1.829 m)      GEN: Well-appearing, well-nourished. Alert. No acute distress.  HENT: [place]/AT. Moist mucous membranes. No visible lesions. EYES: PERRL. Sclera non-icteric. Conjunctiva clear. RESP: Clear to auscultation bilaterally. No wheezes, rales, or rhonchi. No increased work of breathing. CV: Tachycardic and regular rhythm. No murmurs, gallops, or rubs. No carotid bruits. No JVD elevation. 1+ edema to mid-shins ABD: Soft. Non-tender. Non-distended. Normoactive bowel sounds. EXT: 1+ edema to mid-shins. Warm and well perfused. NEURO: Cranial nerves II-XII grossly intact. Able to lift all four extremities against gravity. No apparent audiovisual hallucinations. Speech fluent and appropriate. PSYCH: Patient is calm and pleasant. Appropriate affect. Well-groomed; speech is appropriate and on-subject.  Assessment/Plan:  Active Problems:   Orthostatic hypotension  Mr. Benning is a 76yo male with PMH significant for SSS s/p PPM, CAD s/p stenting on aspirin and Plavix, mild MCI, COPD, [name], CVA, and chronic diastolic heart failure who presents with recurrent dyspnea with exertion and weakness. Has had multiple prior admissions and clinic visits addressing this chronic and episodic issue.    Recurrent dyspnea and weakness, now resolved Patient denies complaints. Likely multifactorial secondary to COPD, untreated OSA, diastolic HF, CAD, and heart block, and exacerbated by dehydration and low BP. Orthostatics positive on admission. Afebrile and VSS. O2 sats remaining >93% on RA. CXR without signs of infection - R diaphragm elevation appears chronic. Received 500cc IVF bolus and maintenance fluids 122mL/hr x12hr. Orthostatics negative this morning. - Telemetry - Will need outpatient cardiology and EP follow up - albuterol and duonebs PRN - Repeat CBC this afternoon with stable Hb 9.1  Orthostatic hypotension Positive orthostatics yesterday, negative this morning. S/p IVF bolus and maintenance fluids. - Hold home ramipril, torsemide, and isosorbide mononitrate in setting of hypotension - Will dc diltiazem - Will increase metoprolol to 50mg  qday for better control of tachyarrhythmias  AKI on [name], likely secondary to dehydration, now resolved Cr back to baseline today ~1.5 (baseline ~1.4-1.7). 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 Most recent stent 2017, on aspirin and Plavix. Follows with Cardiology and EP outpatient. - Continue home aspirin and plavix - Holding home BP medications in setting of hypotension  COPD, possible pulmonary HTN, and moderate restrictive lung disease Recently seen by Dr. Ashok Cordia, his pulmonologist. 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 Possible pulmonary HTN, suggested with echocardiogram. Not confirmed with [facility]. PFTs in October 2018 showed moderate restrictive lung disease, possibly with some component secondary to obesity. CT chest in 2017 without evidence of interstitial lung disease. - Continue home bevespi  OSA Untreated OSA, however was recently seen  by Pulmonology where he was noted to require  CPAP.  DM Recently diagnosed. A1c 7.7 on 11/17. BG 142-201. On glipizide outpatient. - CBG monitoring - SSI-S  Dispo: Anticipated discharge in approximately 0-1 day(s).   Colbert Ewing, MD 12/14/2016, 6:32 AM Pager: Mamie Nick [phone]

## 2016-12-14 NOTE — Evaluation (Signed)
Physical Therapy Evaluation Patient Details Name: [name] MRN: [id number] DOB: [birth year] Today's Date: 12/14/2016   History of Present Illness  Pt is a 76 y.o. male who presented with dyspnea with exertion and weakness. He has a PMH significant for complete heart block s/p PPM, CAD s/p stenting, pacemaker placement, COPD, [name], CVA, and chronic diastolic heart failure.     Clinical Impression  Pt presents with increased instability, decreased memory, and an overall decrease in functional mobility secondary to above. PTA, pt mod indep with SPC; lives with wife who is available for 24/7 assist. Was participating with HHPT PTA. Today, pt able to ambulate with intermittent single UE support and min guard for balance; stability improved with UE support. Pt would benefit from continued acute PT services to maximize functional mobility and independence prior to d/c with continued HHPT services.     Follow Up Recommendations Home health PT;Supervision/Assistance - 24 hour    Equipment Recommendations  None recommended by PT    Recommendations for Other Services       Precautions / Restrictions Precautions Precautions: Fall Restrictions Weight Bearing Restrictions: No      Mobility  Bed Mobility Overal bed mobility: Needs Assistance Bed Mobility: Supine to Sit;Sit to Supine     Supine to sit: Supervision Sit to supine: Supervision   General bed mobility comments: Sueprvision for safety  Transfers Overall transfer level: Needs assistance Equipment used: None Transfers: Sit to/from Stand Sit to Stand: Supervision         General transfer comment: Supervision for safety. Able to don gown in standing with no LOB  Ambulation/Gait Ambulation/Gait assistance: Min guard Ambulation Distance (Feet): 300 Feet Assistive device: None;1 person hand held assist   Gait velocity: Decreased Gait velocity interpretation: <1.8 ft/sec, indicative of risk for recurrent falls General  Gait Details: Increased instability with no AD, requiring close min guard for balance. Pt intermittently reaching for RUE support on rail/furniture, which improved stability.   Stairs Stairs: Yes Stairs assistance: Supervision Stair Management: One rail Right;Alternating pattern;Forwards Number of Stairs: 10 General stair comments: Ascend/descended steps with single UE support on rail and supervision for safety  Wheelchair Mobility    Modified Rankin (Stroke Patients Only)       Balance Overall balance assessment: Needs assistance Sitting-balance support: No upper extremity supported;Feet supported Sitting balance-Leahy Scale: Good     Standing balance support: Single extremity supported;No upper extremity supported;During functional activity Standing balance-Leahy Scale: Fair Standing balance comment: min guard assist for mobility without UE support.                              Pertinent Vitals/Pain Pain Assessment: No/denies pain    Home Living Family/patient expects to be discharged to:: Private residence Living Arrangements: Spouse/significant other Available Help at Discharge: Family;Available 24 hours/day Type of Home: Mobile home Home Access: Ramped entrance     Home Layout: One level Home Equipment: Cane - single point      Prior Function Level of Independence: Independent with assistive device(s)         Comments: Mod indep with SPC     Hand Dominance   Dominant Hand: Right    Extremity/Trunk Assessment   Upper Extremity Assessment Upper Extremity Assessment: Generalized weakness    Lower Extremity Assessment Lower Extremity Assessment: Generalized weakness       Communication   Communication: No difficulties  Cognition Arousal/Alertness: Awake/alert Behavior During Therapy: WFL for tasks  assessed/performed Overall Cognitive Status: Impaired/Different from baseline Area of Impairment:  Memory;Safety/judgement;Awareness;Problem solving                 Orientation Level: Disoriented to;Time;Place;Situation Current Attention Level: Sustained Memory: Decreased short-term memory Following Commands: Follows one step commands with increased time Safety/Judgement: Decreased awareness of safety;Decreased awareness of deficits Awareness: Intellectual Problem Solving: Slow processing General Comments: Very pleasant and appropriately interactive throughout session. Easily distracted during session requiring cues to attend to task. According to OT note, pt reports he has trouble with memory at home.       General Comments General comments (skin integrity, edema, etc.): Pt asymptomatic throughout session    Exercises     Assessment/Plan    PT Assessment Patient needs continued PT services  PT Problem List Decreased strength;Decreased activity tolerance;Decreased balance;Decreased mobility;Decreased coordination;Decreased cognition;Decreased knowledge of use of DME;Decreased safety awareness;Decreased knowledge of precautions;Cardiopulmonary status limiting activity       PT Treatment Interventions DME instruction;Gait training;Stair training;Functional mobility training;Therapeutic activities;Therapeutic exercise;Balance training;Patient/family education;Cognitive remediation    PT Goals (Current goals can be found in the Care Plan section)  Acute Rehab PT Goals Patient Stated Goal: Return home PT Goal Formulation: With patient/family Time For Goal Achievement: 12/28/16 Potential to Achieve Goals: Good    Frequency Min 3X/week   Barriers to discharge        Co-evaluation               AM-PAC PT "6 Clicks" Daily Activity  Outcome Measure Difficulty turning over in bed (including adjusting bedclothes, sheets and blankets)?: None Difficulty moving from lying on back to sitting on the side of the bed? : None Difficulty sitting down on and standing up from a  chair with arms (e.g., wheelchair, bedside commode, etc,.)?: A Little Help needed moving to and from a bed to chair (including a wheelchair)?: A Little Help needed walking in hospital room?: A Little Help needed climbing 3-5 steps with a railing? : A Little 6 Click Score: 20    End of Session Equipment Utilized During Treatment: Gait belt Activity Tolerance: Patient tolerated treatment well Patient left: in bed;with call bell/phone within reach;with bed alarm set Nurse Communication: Mobility status PT Visit Diagnosis: Unsteadiness on feet (R26.81);Muscle weakness (generalized) (M62.81)    Time: 3546-5681 PT Time Calculation (min) (ACUTE ONLY): 13 min   Charges:   PT Evaluation $PT Eval Moderate Complexity: 1 Mod     PT G Codes:   PT G-Codes **NOT FOR INPATIENT CLASS** Functional Assessment Tool Used: AM-PAC 6 Clicks Basic Mobility;Clinical judgement Functional Limitation: Mobility: Walking and moving around Mobility: Walking and Moving Around Current Status (E7517): At least 20 percent but less than 40 percent impaired, limited or restricted Mobility: Walking and Moving Around Goal Status [phone]): At least 1 percent but less than 20 percent impaired, limited or restricted   Mabeline Caras, PT, DPT Acute Rehab Services  Pager: [facility] 12/14/2016, 12:01 PM

## 2016-12-14 NOTE — Discharge Summary (Signed)
Name: [name] MRN: [id number] DOB: May 23, 1940 76 y.o. PCP: Lars Mage, MD  Date of Admission: 12/13/2016  5:47 PM Date of Discharge: 12/14/2016 Attending Physician: Sid Falcon, MD  Discharge Diagnosis: 1. Orthostatic hypotension Active Problems:   Orthostatic hypotension   Discharge Medications: Allergies as of 12/14/2016      Reactions   Novocain [procaine] Other (See Comments)   Hallucinations   Rosuvastatin Calcium Other (See Comments)   Whole body ache/pain   Statins Other (See Comments)   Whole body ache/pain      Medication List    STOP taking these medications   diltiazem 180 MG 24 hr capsule Commonly known as:  CARDIZEM CD   isosorbide mononitrate 30 MG 24 hr tablet Commonly known as:  IMDUR   ramipril 2.5 MG capsule Commonly known as:  ALTACE   torsemide 20 MG tablet Commonly known as:  DEMADEX     TAKE these medications   acetaminophen 500 MG tablet Commonly known as:  TYLENOL Take 500 mg by mouth every 6 (six) hours as needed for headache.   aspirin EC 81 MG tablet Take 81 mg by mouth at bedtime.   clopidogrel 75 MG tablet Commonly known as:  PLAVIX Take 1 tablet (75 mg total) by mouth daily. What changed:  when to take this   escitalopram 10 MG tablet Commonly known as:  LEXAPRO Take 1 tablet (10 mg total) at bedtime by mouth.   glipiZIDE 5 MG tablet Commonly known as:  GLUCOTROL Take 1 tablet (5 mg total) daily before breakfast by mouth.   Glycopyrrolate-Formoterol 9-4.8 MCG/ACT Aero Commonly known as:  BEVESPI AEROSPHERE Inhale 2 puffs into the lungs 2 (two) times daily.   levothyroxine 25 MCG tablet Commonly known as:  SYNTHROID, LEVOTHROID Take 25 mcg by mouth daily before breakfast.   metoprolol succinate 25 MG 24 hr tablet Commonly known as:  TOPROL-XL Take 2 tablets (50 mg total) by mouth daily. Take with or immediately following a meal. What changed:  how much to take   multivitamin with minerals Tabs  tablet Take 1 tablet by mouth daily.   nitroGLYCERIN 0.4 MG SL tablet Commonly known as:  NITROSTAT Place 0.4 mg under the tongue daily as needed for chest pain.   PROAIR HFA 108 (90 Base) MCG/ACT inhaler Generic drug:  albuterol Inhale 1-2 puffs into the lungs every 4 (four) hours as needed for shortness of breath.   ranitidine 150 MG tablet Commonly known as:  ZANTAC Take 1 tablet (150 mg total) by mouth 2 (two) times daily.   Spacer/Aero Chamber Mouthpiece Misc 1 Device by Does not apply route as directed.   VITAMIN B-12 PO Place 1 mL under the tongue daily.            Durable Medical Equipment  (From admission, onward)        Start     Ordered   12/14/16 0000  For home use only DME continuous positive airway pressure (CPAP)    Question Answer Comment  Patient has OSA or probable OSA Yes   Is the patient currently using CPAP in the home No   Settings 11-15   CPAP supplies needed Mask, headgear, cushions, filters, heated tubing and water chamber      12/14/16 1513      Disposition and follow-up:   [name] was discharged from [facility] in Stable condition.  At the hospital follow up visit please address:  1.  - Please address  his BP and HR. His blood pressures were soft to normotensive. HR in 60s-70s. Orthostatics negative on discharge. At discharge, we held ramipril, torsemide, and imdur. We discontinued diltiazem, which was recently started at prior admission secondary to tachyarrhythmias. We increased metoprolol to 50mg  daily.  2. For re-starting BP medications, we recommend restarting medication in this order for CP and volume overload control: (1) torsemide, (2) imdur, (3) ramipril.  3. Was he able to pick up his CPAP machine and is he able to use it?  4.  Labs / imaging needed at time of follow-up: Check BP and HR  5.  Pending labs/ test needing follow-up: None  Follow-up Appointments: Follow-up Information    Lars Mage, MD. Go on 12/17/2016.   Specialty:  Internal Medicine Why:  at 10:15am for BP management and hospital follow up Contact information: [place] [address] [phone]           Hospital Course by problem list: Active Problems:   Orthostatic hypotension   1. Orthostatic hypotension Patient presented with chronic DOE and weakness that acutely worsened the day of admission. Direct admit from clinic. His BP was 84/61 on admission with positive orthostatics. He was given IV fluid bolus and maintenance with improvement in his BP, orthostatics, renal function, and symptoms. VSS and afebrile. CXR negative for infection. Infectious etiology ruled out. He was stable on discharge and wife was comfortable with taking him home with home PT and OT. Informed wife to hold ramipril, torsemide, and imdur. Discontinued diltiazem, which was recently started on prior admission secondary to tachyarrhythmias. Increased metoprolol to 50mg  daily. Informed wife to not give metoprolol if patient's BP was <100/60 and to re-check in 1-2 hours and to give metoprolol if >100/60 at that time.  2. OSA Patient was recently seen by Pulmonology and had sleep study done. Ordered CPAP to be picked up. Patient's settings for CPAP are 14 cm H2O with medium-sized fullface mask.  Discharge Vitals:   BP 106/61 (BP Location: Left Arm)   Pulse 71   Temp 97.6 F (36.4 C) (Oral)   Resp 18   Ht 6' (1.829 m)   Wt 219 lb 3.2 oz (99.4 kg) Comment: scale b  SpO2 97%   BMI 29.73 kg/m   Pertinent Labs, Studies, and Procedures:  CBC Latest Ref Rng & Units 12/14/2016 12/14/2016 12/13/2016  WBC 4.0 - 10.5 K/uL 7.4 6.2 10.5  Hemoglobin 13.0 - 17.0 g/dL 9.1(L) 8.6(L) 10.3(L)  Hematocrit 39.0 - 52.0 % 29.2(L) 27.4(L) 32.9(L)  Platelets 150 - 400 K/uL 198 193 281   BMP Latest Ref Rng & Units 12/14/2016 12/13/2016 12/06/2016  Glucose 65 - 99 mg/dL 116(H) 105(H) 121(H)  BUN 6 - 20 mg/dL 23(H) 28(H) 36(H)  Creatinine  0.61 - 1.24 mg/dL 1.53(H) 1.97(H) 1.66(H)  Sodium 135 - 145 mmol/L 135 134(L) 133(L)  Potassium 3.5 - 5.1 mmol/L 4.0 3.7 3.5  Chloride 101 - 111 mmol/L 104 101 102  CO2 22 - 32 mmol/L 23 24 22   Calcium 8.9 - 10.3 mg/dL 8.6(L) 9.2 9.0   CXR 12/3 1. Mild basilar atelectasis and/or scarring. No acutecardiopulmonary disease. 2. Cardiac pacer with lead tips in right atrium and right ventricle. Stable cardiomegaly .  Discharge Instructions: Discharge Instructions    Call MD for:  difficulty breathing, headache or visual disturbances   Complete by:  As directed    Call MD for:  extreme fatigue   Complete by:  As directed    Call  MD for:  persistant dizziness or light-headedness   Complete by:  As directed    Call MD for:  persistant nausea and vomiting   Complete by:  As directed    Call MD for:  temperature >100.4   Complete by:  As directed    Diet - low sodium heart healthy   Complete by:  As directed    For home use only DME continuous positive airway pressure (CPAP)   Complete by:  As directed    Patient has OSA or probable OSA:  Yes   Is the patient currently using CPAP in the home:  No   Settings:  11-15   CPAP supplies needed:  Mask, headgear, cushions, filters, heated tubing and water chamber   Increase activity slowly   Complete by:  As directed      Signed: Colbert Ewing, MD 12/14/2016, 3:23 PM   Pager: Mamie Nick [phone]

## 2016-12-14 NOTE — Discharge Instructions (Signed)
Please come to the clinic downstairs for an appointment on 12/17/2016 at 10:15am for BP management and hospital follow-up.  Before this appointment, please DO NOT take torsemide, imdur, ramipril, or diltiazem. The doctor at the follow up appointment will let you know which blood pressure medicines to start back and when.  Please increase the metoprolol to 50mg  once a day. Please DO NOT administer this medication if his BP is less than 100/60. If his BP is less than 100/60, do not take metoprolol and recheck BP 1-2 hours later. If his BP is greater than 100/60 at that time, please take metoprolol.

## 2016-12-14 NOTE — Care Management Obs Status (Signed)
[place] NOTIFICATION   Patient Details  Name: [name] MRN: [id number] Date of Birth: [birth year]   Medicare Observation Status Notification Given:  Yes    Carles Collet, RN 12/14/2016, 11:04 AM

## 2016-12-14 NOTE — Evaluation (Addendum)
Occupational Therapy Evaluation Patient Details Name: [name] MRN: [id number] DOB: September 27, 1940 Today's Date: 12/14/2016    History of Present Illness Pt is a 76 y.o. male who presented with dyspnea with exertion and weakness. He has a PMH significant for complete heart block s/p PPM, CAD s/p stenting, pacemaker placement, COPD, [name], CVA, and chronic diastolic heart failure.    Clinical Impression   Pt reports that he was independent with basic ADL in his home and utilized cane for community mobility. His wife assists him with medication management per his report. Pt currently requires min guard assist for toilet transfers and supervision for LB ADL and standing grooming tasks for safety. He presents with greatly limited short-term memory, awareness, and attention impacting his safety with ADL and functional mobility. He would benefit from continued OT services while admitted to improve independence and safety with ADL and functional mobility prior to returning home. Feel pt will need 24 hour assistance at home and would benefit from home health OT services to address cognitive compensatory strategies in his home environment.     Follow Up Recommendations  Supervision/Assistance - 24 hour;Home health OT    Equipment Recommendations  Tub/shower seat    Recommendations for Other Services       Precautions / Restrictions Precautions Precautions: Fall Restrictions Weight Bearing Restrictions: No      Mobility Bed Mobility Overal bed mobility: Needs Assistance Bed Mobility: Supine to Sit     Supine to sit: Supervision     General bed mobility comments: Sueprvision for safety  Transfers Overall transfer level: Needs assistance Equipment used: None Transfers: Sit to/from Stand Sit to Stand: Supervision         General transfer comment: Supervision for safety. Min guard assist once ambulating for stability    Balance Overall balance assessment: Needs  assistance Sitting-balance support: No upper extremity supported;Feet supported Sitting balance-Leahy Scale: Good     Standing balance support: Single extremity supported;No upper extremity supported;During functional activity Standing balance-Leahy Scale: Fair Standing balance comment: min guard assist for mobility without UE support.                            ADL either performed or assessed with clinical judgement   ADL Overall ADL's : Needs assistance/impaired Eating/Feeding: Set up;Sitting   Grooming: Supervision/safety;Standing   Upper Body Bathing: Set up;Sitting   Lower Body Bathing: Supervison/ safety;Sit to/from stand   Upper Body Dressing : Set up;Sitting   Lower Body Dressing: Supervision/safety;Sit to/from stand   Toilet Transfer: Min guard;Ambulation;Regular Toilet   Toileting- Water quality scientist and Hygiene: Supervision/safety;Sit to/from stand       Functional mobility during ADLs: Min guard General ADL Comments: Pt able to complete ADL supervision-min guard assist for safety. Greatly impacted by poor memory.      Vision Baseline Vision/History: Wears glasses Wears Glasses: Reading only("sometimes") Patient Visual Report: No change from baseline Vision Assessment?: Vision impaired- to be further tested in functional context Additional Comments: Difficulty reading numbers on BP machine     Perception     Praxis      Pertinent Vitals/Pain Pain Assessment: No/denies pain     Hand Dominance Right   Extremity/Trunk Assessment Upper Extremity Assessment Upper Extremity Assessment: Generalized weakness   Lower Extremity Assessment Lower Extremity Assessment: Generalized weakness       Communication Communication Communication: No difficulties   Cognition Arousal/Alertness: Awake/alert Behavior During Therapy: WFL for tasks assessed/performed Overall Cognitive Status:  Impaired/Different from baseline Area of Impairment:  Orientation;Memory;Safety/judgement;Awareness;Problem solving;Following commands;Attention                 Orientation Level: Disoriented to;Time;Place;Situation Current Attention Level: Sustained Memory: Decreased short-term memory Following Commands: Follows one step commands with increased time Safety/Judgement: Decreased awareness of safety;Decreased awareness of deficits Awareness: Intellectual Problem Solving: Slow processing General Comments: Pt with little memory of education from beginning of session. Very pleasant throughout. Reports that he has trouble with his memory at home.    General Comments  Orthostatic vitals: lying 137/83, sitting 124/92, standing at 0 minutes 122/83, standing at 3 minutes 127/95    Exercises     Shoulder Instructions      Home Living Family/patient expects to be discharged to:: Private residence Living Arrangements: Spouse/significant other Available Help at Discharge: Family;Available 24 hours/day Type of Home: Mobile home Home Access: Ramped entrance     Home Layout: One level     Bathroom Shower/Tub: Teacher, early years/pre: Standard     Home Equipment: Walker - 4 wheels          Prior Functioning/Environment Level of Independence: Independent with assistive device(s)        Comments: Uses cane outside of home        OT Problem List: Decreased strength;Decreased activity tolerance;Impaired balance (sitting and/or standing);Decreased safety awareness;Decreased knowledge of use of DME or AE;Decreased knowledge of precautions;Decreased cognition      OT Treatment/Interventions: Self-care/ADL training;Therapeutic exercise;Energy conservation;DME and/or AE instruction;Therapeutic activities;Patient/family education;Balance training;Cognitive remediation/compensation    OT Goals(Current goals can be found in the care plan section) Acute Rehab OT Goals Patient Stated Goal: better memory OT Goal Formulation: With  patient Time For Goal Achievement: 12/28/16 Potential to Achieve Goals: Good ADL Goals Pt Will Transfer to Toilet: with modified independence;ambulating Pt Will Perform Toileting - Clothing Manipulation and hygiene: with modified independence;sit to/from stand Additional ADL Goal #1: Pt will complete 3 consecutive grooming tasks utilizing an external memory aid to remember sequence. Additional ADL Goal #2: Pt will demonstrate emergent awareness during morning ADL routine.  OT Frequency: Min 2X/week   Barriers to D/C:            Co-evaluation              AM-PAC PT "6 Clicks" Daily Activity     Outcome Measure Help from another person eating meals?: A Little Help from another person taking care of personal grooming?: A Little Help from another person toileting, which includes using toliet, bedpan, or urinal?: A Little Help from another person bathing (including washing, rinsing, drying)?: A Little Help from another person to put on and taking off regular upper body clothing?: A Little Help from another person to put on and taking off regular lower body clothing?: A Little 6 Click Score: 18   End of Session Equipment Utilized During Treatment: Gait belt Nurse Communication: Mobility status(vitals)  Activity Tolerance: Patient tolerated treatment well Patient left: in chair;with call bell/phone within reach;with chair alarm set  OT Visit Diagnosis: Other abnormalities of gait and mobility (R26.89);Other symptoms and signs involving cognitive function                Time: 0827-0905 OT Time Calculation (min): 38 min Charges:  OT General Charges $OT Visit: 1 Visit OT Evaluation $OT Eval Moderate Complexity: 1 Mod OT Treatments $Self Care/Home Management : 23-37 mins G-Codes: OT G-codes **NOT FOR INPATIENT CLASS** Functional Assessment Tool Used: Clinical judgement Functional Limitation: Self  care Self Care Current Status [phone]): At least 1 percent but less than 20  percent impaired, limited or restricted Self Care Goal Status (M1848): 0 percent impaired, limited or restricted   Norman Herrlich, MS OTR/L  Pager: [place] 12/14/2016, 9:26 AM

## 2016-12-15 ENCOUNTER — Telehealth: Payer: Self-pay | Admitting: Pulmonary Disease

## 2016-12-15 NOTE — Telephone Encounter (Signed)
Order was placed at the ov w/ [facility] on 11.30.18 > was sent to [facility] @ [phone] and spoke with Suanne Marker who verified that the order was indeed received and is currently being processed by their review board.  Patient will be receiving a call once this phase is complete  Called spoke with patient's wife Stanton Kidney and discussed the above - advised Stanton Kidney that this process typically takes about a week.  Stanton Kidney okay with this and voiced her understanding.  Offered to give [facility] the number to [facility] if she would like to call for an update >> Stanton Kidney reported there is no need and she will look this up online.  Nothing further needed; will sign off

## 2016-12-17 ENCOUNTER — Encounter: Payer: Self-pay | Admitting: Internal Medicine

## 2016-12-17 ENCOUNTER — Other Ambulatory Visit: Payer: Self-pay

## 2016-12-17 ENCOUNTER — Ambulatory Visit (INDEPENDENT_AMBULATORY_CARE_PROVIDER_SITE_OTHER): Payer: Medicare Other | Admitting: Internal Medicine

## 2016-12-17 DIAGNOSIS — Z79899 Other long term (current) drug therapy: Secondary | ICD-10-CM

## 2016-12-17 DIAGNOSIS — R0600 Dyspnea, unspecified: Secondary | ICD-10-CM

## 2016-12-17 DIAGNOSIS — I2510 Atherosclerotic heart disease of native coronary artery without angina pectoris: Secondary | ICD-10-CM | POA: Diagnosis not present

## 2016-12-17 DIAGNOSIS — I459 Conduction disorder, unspecified: Secondary | ICD-10-CM

## 2016-12-17 DIAGNOSIS — I5030 Unspecified diastolic (congestive) heart failure: Secondary | ICD-10-CM | POA: Diagnosis not present

## 2016-12-17 DIAGNOSIS — G4733 Obstructive sleep apnea (adult) (pediatric): Secondary | ICD-10-CM

## 2016-12-17 DIAGNOSIS — J449 Chronic obstructive pulmonary disease, unspecified: Secondary | ICD-10-CM

## 2016-12-17 DIAGNOSIS — Z87891 Personal history of nicotine dependence: Secondary | ICD-10-CM

## 2016-12-17 DIAGNOSIS — R0989 Other specified symptoms and signs involving the circulatory and respiratory systems: Secondary | ICD-10-CM | POA: Diagnosis not present

## 2016-12-17 DIAGNOSIS — R0609 Other forms of dyspnea: Principal | ICD-10-CM

## 2016-12-17 NOTE — Patient Instructions (Signed)
FOLLOW-UP INSTRUCTIONS When: next week in our clinic For: blood pressure, fluid pills, shortness of breath What to bring: current medications  I have resumed your torsemide 20mg  daily to help with excess fluid.  Please keep a close eye on blood pressure and weight over the next few days.  Please call our after hours line if you notice low BP, dizziness, or other concerning symptoms.

## 2016-12-17 NOTE — Progress Notes (Signed)
Internal Medicine Clinic Attending  Case discussed with Dr. Wallace at the time of the visit.  We reviewed the resident's history and exam and pertinent patient test results.  I agree with the assessment, diagnosis, and plan of care documented in the resident's note.  

## 2016-12-17 NOTE — Progress Notes (Signed)
CC: here for hospital f/u due to orthostatic hypotension  HPI:  [name] is a 76 y.o. man with PMHx as detailed below here for hospital follow up.  He was admitted directly from clinic on 12/3-12/4 for DOE and weakness due to orthostatic hypotension.  He responded well to IV fluids and was discharged with improvement in his clinical status and serum creatinine.  At discharged his diltazem, which had recently been started, was discontinued.  He was instructed to hold his torsemide, ramipril, and imdur.  His metoprolol succinate was increased to 50mg  daily.  Since discharge, his wife reports he is doing well, has had better energy, and worked with physical therapy this morning.  They are still awaiting delivery of his CPAP.  Patient reports that he feels better although states he feels that he "needs more breath" when he became dyspneic during the encounter.  He denies any chest pain or cough.  His wife reports that his legs are a little more edematous.  He has gained about 6 pounds since discharge.  Past Medical History:  Diagnosis Date  . Anxiety   . Arthritis    "knees, back, shoulders" (11/26/2016)  . CAD (coronary artery disease)    a. remote stenting >20 years ago. b. PTCA unknown vessel ~2013. c. 10/2015: s/p rotational atherectomy/DES to distal RCA and RPDA c/b embolic CVA  . CHB (complete heart block) ([place]) 02/2015   [name] 03/03/2015  . Chronic diastolic CHF (congestive heart failure) ([place])   . Chronic shoulder pain    "both shoulders" (11/26/2016)  . [name] (chronic kidney disease), stage III ([place])   . COPD (chronic obstructive pulmonary disease) ([place])   . Depression   . Dilated aortic root ([place])    a. mild by echo 09/2015.  Marland Kitchen GERD (gastroesophageal reflux disease)   . Headache    "weekly" (11/26/2016)  . Heart attack [facility]) 'many years ago'  . High cholesterol   . Hypertension   . Hypothyroidism   . OSA (obstructive sleep apnea)    "suppose to have a mask; they  haven't got one adjusted for him yet" (11/26/2016)  . Presence of permanent cardiac pacemaker   . 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 Skin cancer of face    "had it cut off"  . Statin intolerance   . Stroke [facility]) 10/2015   "mini stroke; been having memory issues & confusion ever since" (11/26/2016)  . Symptomatic bradycardia    a. syncope/sinus arrest and bradycardia s/p St. Jude PPM 02/2015 with lead revision 03/2015.   Review of Systems:  Please see pertinent ROS reviewed in HPI and problem based charting.   Physical Exam:  Vitals:   12/17/16 1045  BP: 116/86  Pulse: 91  Temp: 97.7 F (36.5 C)  TempSrc: Oral  SpO2: 100%  Weight: 225 lb 12.8 oz (102.4 kg)  Height: 6\' 1"  (1.854 m)   Physical Exam  Constitutional:  Elderly, fatigued appearing man, sitting in wheelchair  HENT:  Head: Normocephalic and atraumatic.  Cardiovascular: Normal rate and normal heart sounds.  Irregular rhythm  Pulmonary/Chest: No respiratory distress. He has rales.  Mildly dyspneic, saturating 100% on room air, fine crackles in the bases.  No wheezes.  Musculoskeletal: He exhibits edema.  1+ lower extremity edema bilaterally  Skin: Skin is warm and dry.     Assessment & Plan:   See Encounters Tab for problem based charting.  Patient discussed with Dr. Lynnae January.  DOE (dyspnea on exertion) Assessment: His symptoms of fatigue and  DOE appear to have improved although he did become dyspneic during our encounter.  His oxygen saturation is appropriate.  He has gained about 6 pounds since discharge, has some lower extremity edema, and lung exam reveals basilar crackles.  He has been holding his torsemide, ramipril, and Imdur since discharge.  Overall, his dyspnea is likely multifactorial owing to his COPD, OSA, diastolic heart failure (although only grade 1), CAD, and heart block.  However, given the symptoms as described above, we discussed resuming his torsemide and his wife is agreeable to this.  Plan: -  resume Torsemide 20mg  daily - continue to hold ramipril and Imdur - continue Metoprolol at current dose - advised wife to weigh patient daily and monitor his BP.  She was provided with after hours clinic phone number to use if needed - RTC next week for continued follow up

## 2016-12-17 NOTE — Assessment & Plan Note (Signed)
Assessment: His symptoms of fatigue and DOE appear to have improved although he did become dyspneic during our encounter.  His oxygen saturation is appropriate.  He has gained about 6 pounds since discharge, has some lower extremity edema, and lung exam reveals basilar crackles.  He has been holding his torsemide, ramipril, and Imdur since discharge.  Overall, his dyspnea is likely multifactorial owing to his COPD, OSA, diastolic heart failure (although only grade 1), CAD, and heart block.  However, given the symptoms as described above, we discussed resuming his torsemide and his wife is agreeable to this.  Plan: - resume Torsemide 20mg  daily - continue to hold ramipril and Imdur - continue Metoprolol at current dose - advised wife to weigh patient daily and monitor his BP.  She was provided with after hours clinic phone number to use if needed - RTC next week for continued follow up

## 2016-12-20 ENCOUNTER — Other Ambulatory Visit: Payer: Self-pay

## 2016-12-20 ENCOUNTER — Telehealth: Payer: Self-pay | Admitting: Internal Medicine

## 2016-12-20 ENCOUNTER — Observation Stay (HOSPITAL_COMMUNITY)
Admission: EM | Admit: 2016-12-20 | Discharge: 2016-12-21 | Disposition: A | Discharge status: Home / Self Care | Payer: Medicare Other | Attending: Internal Medicine | Admitting: Internal Medicine

## 2016-12-20 ENCOUNTER — Emergency Department (HOSPITAL_COMMUNITY): Payer: Medicare Other

## 2016-12-20 ENCOUNTER — Encounter (HOSPITAL_COMMUNITY): Payer: Self-pay

## 2016-12-20 DIAGNOSIS — K219 Gastro-esophageal reflux disease without esophagitis: Secondary | ICD-10-CM | POA: Diagnosis not present

## 2016-12-20 DIAGNOSIS — I2510 Atherosclerotic heart disease of native coronary artery without angina pectoris: Secondary | ICD-10-CM | POA: Insufficient documentation

## 2016-12-20 DIAGNOSIS — I130 Hypertensive heart and chronic kidney disease with heart failure and stage 1 through stage 4 chronic kidney disease, or unspecified chronic kidney disease: Secondary | ICD-10-CM | POA: Insufficient documentation

## 2016-12-20 DIAGNOSIS — E7800 Pure hypercholesterolemia, unspecified: Secondary | ICD-10-CM | POA: Insufficient documentation

## 2016-12-20 DIAGNOSIS — M1990 Unspecified osteoarthritis, unspecified site: Secondary | ICD-10-CM | POA: Insufficient documentation

## 2016-12-20 DIAGNOSIS — Z7902 Long term (current) use of antithrombotics/antiplatelets: Secondary | ICD-10-CM | POA: Insufficient documentation

## 2016-12-20 DIAGNOSIS — E039 Hypothyroidism, unspecified: Secondary | ICD-10-CM | POA: Insufficient documentation

## 2016-12-20 DIAGNOSIS — I5032 Chronic diastolic (congestive) heart failure: Secondary | ICD-10-CM | POA: Diagnosis not present

## 2016-12-20 DIAGNOSIS — F329 Major depressive disorder, single episode, unspecified: Secondary | ICD-10-CM | POA: Insufficient documentation

## 2016-12-20 DIAGNOSIS — N183 Chronic kidney disease, stage 3 (moderate): Secondary | ICD-10-CM | POA: Diagnosis not present

## 2016-12-20 DIAGNOSIS — Z85828 Personal history of other malignant neoplasm of skin: Secondary | ICD-10-CM | POA: Diagnosis not present

## 2016-12-20 DIAGNOSIS — G9340 Encephalopathy, unspecified: Principal | ICD-10-CM | POA: Diagnosis present

## 2016-12-20 DIAGNOSIS — J449 Chronic obstructive pulmonary disease, unspecified: Secondary | ICD-10-CM | POA: Insufficient documentation

## 2016-12-20 DIAGNOSIS — R410 Disorientation, unspecified: Secondary | ICD-10-CM

## 2016-12-20 DIAGNOSIS — Z8673 Personal history of transient ischemic attack (TIA), and cerebral infarction without residual deficits: Secondary | ICD-10-CM | POA: Insufficient documentation

## 2016-12-20 DIAGNOSIS — Z79899 Other long term (current) drug therapy: Secondary | ICD-10-CM | POA: Diagnosis not present

## 2016-12-20 DIAGNOSIS — F419 Anxiety disorder, unspecified: Secondary | ICD-10-CM | POA: Insufficient documentation

## 2016-12-20 DIAGNOSIS — I442 Atrioventricular block, complete: Secondary | ICD-10-CM | POA: Diagnosis not present

## 2016-12-20 DIAGNOSIS — Z950 Presence of cardiac pacemaker: Secondary | ICD-10-CM | POA: Diagnosis not present

## 2016-12-20 DIAGNOSIS — E1122 Type 2 diabetes mellitus with diabetic chronic kidney disease: Secondary | ICD-10-CM | POA: Diagnosis not present

## 2016-12-20 DIAGNOSIS — Z87891 Personal history of nicotine dependence: Secondary | ICD-10-CM | POA: Insufficient documentation

## 2016-12-20 DIAGNOSIS — Z7982 Long term (current) use of aspirin: Secondary | ICD-10-CM | POA: Diagnosis not present

## 2016-12-20 DIAGNOSIS — I951 Orthostatic hypotension: Secondary | ICD-10-CM | POA: Diagnosis not present

## 2016-12-20 DIAGNOSIS — G4733 Obstructive sleep apnea (adult) (pediatric): Secondary | ICD-10-CM | POA: Insufficient documentation

## 2016-12-20 DIAGNOSIS — I252 Old myocardial infarction: Secondary | ICD-10-CM | POA: Insufficient documentation

## 2016-12-20 HISTORY — DX: Type 2 diabetes mellitus without complications: E11.9

## 2016-12-20 HISTORY — DX: Disorientation, unspecified: R41.0

## 2016-12-20 LAB — CBC
HCT: 31.9 % — ABNORMAL LOW (ref 39.0–52.0)
Hemoglobin: 9.9 g/dL — ABNORMAL LOW (ref 13.0–17.0)
MCH: 26 pg (ref 26.0–34.0)
MCHC: 31 g/dL (ref 30.0–36.0)
MCV: 83.7 fL (ref 78.0–100.0)
PLATELETS: 188 10*3/uL (ref 150–400)
RBC: 3.81 MIL/uL — AB (ref 4.22–5.81)
RDW: 17.2 % — AB (ref 11.5–15.5)
WBC: 5.8 10*3/uL (ref 4.0–10.5)

## 2016-12-20 LAB — COMPREHENSIVE METABOLIC PANEL
ALBUMIN: 3.1 g/dL — AB (ref 3.5–5.0)
ALT: 47 U/L (ref 17–63)
ANION GAP: 5 (ref 5–15)
AST: 40 U/L (ref 15–41)
Alkaline Phosphatase: 105 U/L (ref 38–126)
BILIRUBIN TOTAL: 0.4 mg/dL (ref 0.3–1.2)
BUN: 14 mg/dL (ref 6–20)
CALCIUM: 9 mg/dL (ref 8.9–10.3)
CHLORIDE: 105 mmol/L (ref 101–111)
CO2: 26 mmol/L (ref 22–32)
Creatinine, Ser: 1.35 mg/dL — ABNORMAL HIGH (ref 0.61–1.24)
GFR calc Af Amer: 56 mL/min — ABNORMAL LOW (ref >60)
GFR calc non Af Amer: 49 mL/min — ABNORMAL LOW (ref >60)
GLUCOSE: 141 mg/dL — AB (ref 65–99)
POTASSIUM: 3.9 mmol/L (ref 3.5–5.1)
Sodium: 136 mmol/L (ref 135–145)
Total Protein: 6.7 g/dL (ref 6.5–8.1)

## 2016-12-20 LAB — URINALYSIS, ROUTINE W REFLEX MICROSCOPIC
Bilirubin Urine: NEGATIVE
GLUCOSE, UA: NEGATIVE mg/dL
HGB URINE DIPSTICK: NEGATIVE
Ketones, ur: NEGATIVE mg/dL
Leukocytes, UA: NEGATIVE
Nitrite: NEGATIVE
PROTEIN: NEGATIVE mg/dL
Specific Gravity, Urine: 1.02 (ref 1.005–1.030)
pH: 5 (ref 5.0–8.0)

## 2016-12-20 LAB — CBG MONITORING, ED: GLUCOSE-CAPILLARY: 121 mg/dL — AB (ref 65–99)

## 2016-12-20 MED ORDER — METOPROLOL SUCCINATE ER 50 MG PO TB24
50.0000 mg | ORAL_TABLET | Freq: Every day | ORAL | Status: DC
  Administered 2016-12-20 – 2016-12-21 (×2): 50 mg via ORAL
  Filled 2016-12-20 (×2): qty 1

## 2016-12-20 MED ORDER — ADULT MULTIVITAMIN W/MINERALS CH
1.0000 | ORAL_TABLET | Freq: Every day | ORAL | Status: DC
  Administered 2016-12-20 – 2016-12-21 (×2): 1 via ORAL
  Filled 2016-12-20 (×2): qty 1

## 2016-12-20 MED ORDER — ARFORMOTEROL TARTRATE 15 MCG/2ML IN NEBU
15.0000 ug | INHALATION_SOLUTION | Freq: Two times a day (BID) | RESPIRATORY_TRACT | Status: DC
  Administered 2016-12-20 – 2016-12-21 (×2): 15 ug via RESPIRATORY_TRACT
  Filled 2016-12-20 (×2): qty 2

## 2016-12-20 MED ORDER — TORSEMIDE 20 MG PO TABS
20.0000 mg | ORAL_TABLET | Freq: Every day | ORAL | Status: DC
  Administered 2016-12-20 – 2016-12-21 (×2): 20 mg via ORAL
  Filled 2016-12-20 (×2): qty 1

## 2016-12-20 MED ORDER — ACETAMINOPHEN 325 MG PO TABS: 650.0000 mg | ORAL_TABLET | Freq: Four times a day (QID) | ORAL | Status: DC | PRN

## 2016-12-20 MED ORDER — CLOPIDOGREL BISULFATE 75 MG PO TABS
75.0000 mg | ORAL_TABLET | Freq: Every day | ORAL | Status: DC
  Administered 2016-12-20 – 2016-12-21 (×2): 75 mg via ORAL
  Filled 2016-12-20 (×2): qty 1

## 2016-12-20 MED ORDER — ASPIRIN EC 81 MG PO TBEC
81.0000 mg | DELAYED_RELEASE_TABLET | Freq: Every day | ORAL | Status: DC
  Administered 2016-12-20 – 2016-12-21 (×2): 81 mg via ORAL
  Filled 2016-12-20 (×2): qty 1

## 2016-12-20 MED ORDER — LEVOTHYROXINE SODIUM 25 MCG PO TABS
25.0000 ug | ORAL_TABLET | Freq: Every day | ORAL | Status: DC
  Administered 2016-12-21: 25 ug via ORAL
  Filled 2016-12-20: qty 1

## 2016-12-20 MED ORDER — NON FORMULARY: 2.0000 | Freq: Two times a day (BID) | Status: DC

## 2016-12-20 MED ORDER — ESCITALOPRAM OXALATE 10 MG PO TABS
10.0000 mg | ORAL_TABLET | Freq: Every day | ORAL | Status: DC
  Administered 2016-12-20: 10 mg via ORAL
  Filled 2016-12-20: qty 1

## 2016-12-20 MED ORDER — UMECLIDINIUM BROMIDE 62.5 MCG/INH IN AEPB
1.0000 | INHALATION_SPRAY | Freq: Every day | RESPIRATORY_TRACT | Status: DC
  Administered 2016-12-21: 1 via RESPIRATORY_TRACT
  Filled 2016-12-20: qty 7

## 2016-12-20 MED ORDER — FAMOTIDINE 20 MG PO TABS
10.0000 mg | ORAL_TABLET | Freq: Two times a day (BID) | ORAL | Status: DC
Start: 2016-12-20 — End: 2016-12-21
  Administered 2016-12-20 – 2016-12-21 (×2): 10 mg via ORAL
  Filled 2016-12-20 (×2): qty 1

## 2016-12-20 MED ORDER — HEPARIN SODIUM (PORCINE) 5000 UNIT/ML IJ SOLN
5000.0000 [IU] | Freq: Three times a day (TID) | INTRAMUSCULAR | Status: DC
  Administered 2016-12-20 – 2016-12-21 (×2): 5000 [IU] via SUBCUTANEOUS
  Filled 2016-12-20 (×2): qty 1

## 2016-12-20 MED ORDER — ACETAMINOPHEN 650 MG RE SUPP: 650.0000 mg | Freq: Four times a day (QID) | RECTAL | Status: DC | PRN

## 2016-12-20 NOTE — Telephone Encounter (Signed)
   Reason for call:   I received a call from [name] 's wife at 11:15 AM indicating below   Pertinent Data:   Phone call by patient's wife saying that she can't get to [place] because of the roads. She had called EMS earlier and EMS had offered for the pt to be taken to the nearest hospital which is [facility] in [place], and pt's wife did not want him to be evaluated at [facility]. She wants the pt to be evaluated at [facility].  She said that by the time paramedics came, his blood pressure had decreased to 130- but pt is still very confused.   I explained that we would still like the pt to be evaluated in an ER- whichever hospital it may be and to call back the Ems and ask for the pt to be taken to the nearest emergency room, whichever hospital it may be, even though we understand that the wife's preference is [facility]   Assessment / Plan / Recommendations:   As above   As always, pt is advised that if symptoms worsen or new symptoms arise, they should go to an urgent care facility or to to ER for further evaluation.   Burgess Estelle, MD   12/20/2016, 11:21 AM

## 2016-12-20 NOTE — ED Notes (Signed)
Carol-(SR) Heart Healthy Dinner Tray Ordered @ 1724-per RN

## 2016-12-20 NOTE — Telephone Encounter (Signed)
   Reason for call:   I received a call from [name], [name] at 9:30 AM indicating blood pressure spiked up and patient is confused.   Pertinent Data:   BP 150/105 with HR 80, very confused (unaware of where he is, but knows his [name]) for the last two days, stumbling, complaining of dizziness. [name] states he sometimes has these episodes but not lasting this long and not as severe. She denies that he has focal weakness; he has not complained of chest pain, shortness of breath; has not had a cough or fever; has been using the restroom without problem. He has had some increased LE edema and abd girth. He has not missed any doses of his medicines.    Assessment / Plan / Recommendations:   [name], CHF, [name], COPD, HTN, [name], [name], h/o TIA with recent hospitalization for orthostatic hypotension with Brandon reporting confusion and unsteady gait. [name] was advised to bring [name] to ED for evaluation since [facility] office is not open today. Etiology could be hypercarbia from untreated OSA, infection, CHF exacerbation.    Brandon Grieve, MD   12/20/2016, 9:30 AM

## 2016-12-20 NOTE — ED Provider Notes (Signed)
[facility] EMERGENCY DEPARTMENT Provider Note   CSN: [id number] Arrival date & time: 12/20/16  1220     History   Chief Complaint Chief Complaint  Patient presents with  . Altered Mental Status    HPI [name] is a 76 y.o. male.  HPI Patient presents with mental status changes.  Over the last 3 days has been somewhat confused.  Reportedly has not had fevers.  Will come and go.  Earlier at the house did not know where he was and it may be worse when he wakes up it seems to be worse after he wakes up.  Has had recent admission in the hospital and had adjustment of medications.  Reportedly mental status had improved but once he got started back on his torsemide and other medications he began to be more out of it again.  Decreased energy level.  Has had good oral intake.  No dysuria.  No diarrhea or constipation.  No cough.  Patient's wife states he has been putting on some weight appropriately.  Reportedly put on 3 pounds since yesterday.  Patient Past Medical History:  Diagnosis Date  . Anxiety   . Arthritis    "knees, back, shoulders" (11/26/2016)  . CAD (coronary artery disease)    a. remote stenting >20 years ago. b. PTCA unknown vessel ~2013. c. 10/2015: s/p rotational atherectomy/DES to distal RCA and RPDA c/b embolic CVA  . CHB (complete heart block) ([place]) 02/2015   [name] 03/03/2015  . Chronic diastolic CHF (congestive heart failure) ([place])   . Chronic shoulder pain    "both shoulders" (11/26/2016)  . [name] (chronic kidney disease), stage III ([place])   . COPD (chronic obstructive pulmonary disease) ([place])   . Depression   . Dilated aortic root ([place])    a. mild by echo 09/2015.  Marland Kitchen GERD (gastroesophageal reflux disease)   . Headache    "weekly" (11/26/2016)  . Heart attack [facility]) 'many years ago'  . High cholesterol   . Hypertension   . Hypothyroidism   . OSA (obstructive sleep apnea)    "suppose to have a mask; they haven't got one adjusted for him  yet" (11/26/2016)  . Presence of permanent cardiac pacemaker   . 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 Skin cancer of face    "had it cut off"  . Statin intolerance   . Stroke [facility]) 10/2015   "mini stroke; been having memory issues & confusion ever since" (11/26/2016)  . Symptomatic bradycardia    a. syncope/sinus arrest and bradycardia s/p St[employer] Jude PPM 02/2015 with lead revision 03/2015.    Patient Active Problem List   Diagnosis Date Noted  . Orthostatic hypotension 12/13/2016  . Type 2 diabetes mellitus without complication, without long-term current use of insulin ([place]) 11/29/2016  . SVT (supraventricular tachycardia) ([place]) 11/26/2016  . Alpha-1-antitrypsin deficiency carrier ([place]) 11/25/2016  . Restrictive lung disease 11/08/2016  . COPD (chronic obstructive pulmonary disease) ([place]) 10/25/2016  . Shortness of breath 10/12/2016  . Anemia, macrocytic 12/24/2015  . Frail elderly 11/14/2015  . History of heart artery stent 11/14/2015  . [name] stage G3a/A1, GFR 45-59 and albumin creatinine ratio <30 mg/g ([place]) 11/05/2015  . Facial weakness   . S/P PTCA (percutaneous transluminal coronary angioplasty)   . Hyperlipidemia   . Essential hypertension   . S/P [facility] Feb 2017 10/23/2015  . CAD-S/P remote PCI 20 yrs ago, ? 4 years ago, and s/p RCA PCI/DES 10/22/15 10/23/2015  . Cerebral thrombosis with  cerebral infarction 10/23/2015  . DOE (dyspnea on exertion)   . Accelerating angina ([place])   . Hypothyroidism 07/09/2015  . Fatigue due to depression 05/09/2015  . Moderate single current episode of major depressive disorder ([place]) 05/09/2015  . 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 Syncope 04/10/2015  . Faintness   . Retinal detachment, tractional, right eye 03/15/2015  . Hepatic cyst 03/11/2015  . Pacemaker 03/11/2015  . Contracture of ankle and foot joint 03/10/2015  . Drug therapy 03/10/2015  . Dysphagia 03/10/2015  . Edema 03/10/2015  . History of ST elevation  myocardial infarction (STEMI) 03/10/2015  . Low back pain 03/10/2015  . Mild cognitive impairment 03/10/2015  . Mediastinal lymphadenopathy 03/10/2015  . Obesity 03/10/2015  . Osteoarthritis 03/10/2015  . 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 Personal history of noncompliance with medical treatment, presenting hazards to health 03/10/2015  . 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 Symptomatic bradycardia 03/04/2015  . History of complete heart block 03/03/2015  . Complete atrioventricular block ([place]) 03/03/2015  . Abnormal chest x-ray 02/19/2015  . Abnormal TSH 02/19/2015  . Anxiety about health 02/19/2015  . CAD in native artery 02/19/2015  . Diastolic CHF ([place]) 50/53/9767  . 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 LVH (left ventricular hypertrophy) 02/19/2015  . Medical non-compliance 02/19/2015  . OSA (obstructive sleep apnea) 02/19/2015  . Prediabetes 02/19/2015  . Rotator cuff tear, right 02/19/2015  . Abnormal computed tomography scan 02/10/2015  . Abnormal CT of the chest 02/10/2015  . Coronary artery disease involving native coronary artery of native heart 01/23/2015  . Non-Q wave myocardial infarction ([place]) 09/09/2011    Past Surgical History:  Procedure Laterality Date  . CARDIAC CATHETERIZATION N/A 10/17/2015   Procedure: Left Heart Cath and Coronary Angiography;  Surgeon: Jettie Booze, MD;  Location: [facility] CV LAB;  Service: Cardiovascular;  Laterality: N/A;  . CARDIAC CATHETERIZATION N/A 10/22/2015   Procedure: Coronary Stent Intervention Rotablater;  Surgeon: Jettie Booze, MD;  Location: [facility];  Service: Cardiovascular;  Laterality: N/A;  . CARDIAC CATHETERIZATION N/A 10/22/2015   Procedure: Left Heart Cath and Coronary Angiography;  Surgeon: Jettie Booze, MD;  Location: [facility] CV LAB;  Service: Cardiovascular;  Laterality: N/A;  . CATARACT EXTRACTION W/ INTRAOCULAR LENS  IMPLANT, BILATERAL Bilateral 2016    . CORONARY ANGIOPLASTY  2012  . CORONARY ANGIOPLASTY WITH STENT PLACEMENT  2001  . CORONARY ANGIOPLASTY WITH STENT PLACEMENT     "took 2 out and put 1 longer one in"  . EP IMPLANTABLE DEVICE N/A 03/04/2015   Procedure: Pacemaker Implant;  Surgeon: Will Meredith Leeds, MD;  Location: [facility] CV LAB;  Service: Cardiovascular;  Laterality: N/A;  . EP IMPLANTABLE DEVICE N/A 04/10/2015   Procedure: PPM Lead Revision/Repair;  Surgeon: Will Meredith Leeds, MD;  Location: [place] [facility];  Service: Cardiovascular;  Laterality: N/A;  . EYE SURGERY    . INSERT / REPLACE / REMOVE PACEMAKER    . RETINAL DETACHMENT SURGERY Bilateral    "several on each side"       Home Medications    Prior to Admission medications   Medication Sig Start Date End Date Taking? Authorizing Provider  acetaminophen (TYLENOL) 500 MG tablet Take 500 mg by mouth every 6 (six) hours as needed for headache.    [provider]  albuterol (PROAIR HFA) 108 (90 Base) MCG/ACT inhaler Inhale 1-2 puffs into the lungs every 4 (four) hours as needed for shortness of  breath.    [provider]  aspirin EC 81 MG tablet Take 81 mg by mouth at bedtime.     [provider]  clopidogrel (PLAVIX) 75 MG tablet Take 1 tablet (75 mg total) by mouth daily. Patient taking differently: Take 75 mg by mouth at bedtime.  10/20/15   Arbutus Leas, NP  Cyanocobalamin (VITAMIN B-12 PO) Place 1 mL under the tongue daily.    [provider]  escitalopram (LEXAPRO) 10 MG tablet Take 1 tablet (10 mg total) at bedtime by mouth. 11/29/16   Rice, Resa Miner, MD  glipiZIDE (GLUCOTROL) 5 MG tablet Take 1 tablet (5 mg total) daily before breakfast by mouth. 11/29/16 12/29/16  Rice, Resa Miner, MD  Glycopyrrolate-Formoterol (BEVESPI AEROSPHERE) 9-4.8 MCG/ACT AERO Inhale 2 puffs into the lungs 2 (two) times daily. 11/08/16   Javier Glazier, MD  levothyroxine (SYNTHROID, LEVOTHROID) 25 MCG tablet Take 25 mcg by  mouth daily before breakfast.    [provider]  metoprolol succinate (TOPROL-XL) 25 MG 24 hr tablet Take 2 tablets (50 mg total) by mouth daily. Take with or immediately following a meal. 12/14/16   Colbert Ewing, MD  Multiple Vitamin (MULTIVITAMIN WITH MINERALS) TABS tablet Take 1 tablet by mouth daily.     [provider]  nitroGLYCERIN (NITROSTAT) 0.4 MG SL tablet Place 0.4 mg under the tongue daily as needed for chest pain.     [provider]  ranitidine (ZANTAC) 150 MG tablet Take 1 tablet (150 mg total) by mouth 2 (two) times daily. 12/10/16   Javier Glazier, MD  Spacer/Aero Chamber Mouthpiece MISC 1 Device by Does not apply route as directed. 11/08/16   Javier Glazier, MD  torsemide [facility]) 20 MG tablet  12/13/16   [provider]    Family History Family History  Problem Relation Age of Onset  . Heart disease Father   . Heart disease Mother   . Diabetes Sister   . Cancer Sister   . Diabetes Brother   . Diabetes Sister   . Lung disease Neg Hx     Social History Social History   Tobacco Use  . Smoking status: Former Smoker    Packs/day: 1.00    Years: 11.00    Pack years: 11.00    Types: Cigarettes    Start date: 09/09/1947    Last attempt to quit: 09/09/1958    Years since quitting: 58.3  . Smokeless tobacco: Never Used  . Tobacco comment: Significant second-hand exposure. Wife also smokes around him.  Substance Use Topics  . Alcohol use: Yes    Comment: 11/26/2016 ""nothing since 12/2010"  . Drug use: No     Allergies   Novocain [procaine]; Rosuvastatin calcium; and Statins   Review of Systems Review of Systems  Constitutional: Negative for appetite change.  HENT: Negative for congestion.   Respiratory: Negative for shortness of breath.   Cardiovascular: Negative for chest pain.  Gastrointestinal: Negative for abdominal distention.  Genitourinary: Negative for flank pain.  Musculoskeletal: Negative for back  pain.  Skin: Negative for rash.  Neurological: Negative for numbness.  Psychiatric/Behavioral: Positive for confusion.     Physical Exam Updated Vital Signs BP 136/90   Pulse (!) 59   Temp (!) 97.5 F (36.4 C) (Oral)   Resp 18   Ht 6\' 1"  (1.854 m)   Wt 102.1 kg (225 lb)   SpO2 99%   BMI 29.69 kg/m   Physical Exam  Constitutional: He appears  well-developed.  HENT:  Head: Atraumatic.  Neck: Neck supple.  Cardiovascular: Normal rate.  Pulmonary/Chest: Effort normal.  Abdominal: Soft.  Musculoskeletal: He exhibits no edema.  Neurological: He is alert.  Awake and appropriate.  Moving all extremities.  Able answer questions but does appear he mildly slow to answer.  Patient's wife states is closer to his baseline.  Reportedly he did not know where he was earlier today.  Skin: Skin is warm. Capillary refill takes less than 2 seconds.     ED Treatments / Results  Labs (all labs ordered are listed, but only abnormal results are displayed) Labs Reviewed  COMPREHENSIVE METABOLIC PANEL - Abnormal; Notable for the following components:      Result Value   Glucose, Bld 141 (*)    Creatinine, Ser 1.35 (*)    Albumin 3.1 (*)    GFR calc non Af Amer 49 (*)    GFR calc Af Amer 56 (*)    All other components within normal limits  CBC - Abnormal; Notable for the following components:   RBC 3.81 (*)    Hemoglobin 9.9 (*)    HCT 31.9 (*)    RDW 17.2 (*)    All other components within normal limits  CBG MONITORING, ED - Abnormal; Notable for the following components:   Glucose-Capillary 121 (*)    All other components within normal limits  URINALYSIS, ROUTINE W REFLEX MICROSCOPIC    EKG  EKG Interpretation  Date/Time:  Monday December 20 2016 14:04:54 EST Ventricular Rate:  62 PR Interval:    QRS Duration: 91 QT Interval:  401 QTC Calculation: 408 R Axis:   -25 Text Interpretation:  Atrial-paced rhythm Borderline left axis deviation Baseline wander in lead(s) V6  Confirmed by Davonna Belling [phone]) on 12/20/2016 2:17:38 PM       Radiology Dg Chest 2 View  Result Date: 12/20/2016 CLINICAL DATA:  Increased confusion, elevated blood sugar, elevated blood pressure, history hypertension, CHF, COPD, diabetes mellitus, coronary artery disease post stenting EXAM: CHEST  2 VIEW COMPARISON:  12/13/2016 FINDINGS: LEFT subclavian transvenous pacemaker leads stable projecting at RIGHT atrium and RIGHT ventricle. Enlargement of cardiac silhouette. Chronic elevation of RIGHT diaphragm. Mediastinal contours and pulmonary vascularity normal. Lungs clear. No pleural effusion or pneumothorax. Bones demineralized with advanced LEFT glenohumeral degenerative changes. IMPRESSION: Enlargement of cardiac silhouette post pacemaker. No acute abnormalities. Electronically Signed   By: Lavonia Dana M.D.   On: 12/20/2016 14:42    Procedures Procedures (including critical care time)  Medications Ordered in ED Medications - No data to display   Initial Impression / Assessment and Plan / ED Course  I have reviewed the triage vital signs and the nursing notes.  Pertinent labs & imaging results that were available during my care of the patient were reviewed by me and considered in my medical decision making (see chart for details).     Patient with episode of confusion.  Worsened after changing in medicines.  Lab work is actually rather reassuring right now.  Urinalysis pending.  Care will be turned over to Dr. Wilson Singer with likely evaluation by internal medicine residents after the urinalysis returns.  No trauma.  Final Clinical Impressions(s) / ED Diagnoses   Final diagnoses:  Confusion    ED Discharge Orders    None       Davonna Belling, MD 12/20/16 [phone]

## 2016-12-20 NOTE — ED Triage Notes (Signed)
Per Pt, Pt is coming from home with complaints of altered mental status that started on Friday night with and increase since then. Wife reports taking blood pressure and noting that it was increasing. BP was 153/103 at home and wife called MD. EMS evaluated pt and the evaluation was normal per wife. Reports that the confusion increased today. HX of Stroke.

## 2016-12-20 NOTE — Discharge Summary (Deleted)
Date: 12/20/2016               Patient Name:  [name] MRN: [id number]  DOB: Aug 20, 1940 Age / Sex: 76 y.o., male   PCP: Lars Mage, MD         Medical Service: Internal Medicine Teaching Service         Attending Physician: Dr. Sid Falcon, MD    First Contact: Dr. Lars Mage Pager: [phone]  Second Contact: Dr. Alphonzo Grieve Pager: [phone]       After Hours (After 5p/  First Contact Pager: [phone]  weekends / holidays): Second Contact Pager: [phone]   Chief Complaint: Altered mental status  History of Present Illness: [name] is a 76 year old male with pmh of cad, chf, copd, htn, hld, osa, and tia who presented with altered mental status and he was not oriented enough to provide an accurate history. A large part of the history was gathered on chart review. The patient's wife called IM stating that her husband has been confused, stumbling, and dizziness for the past 2 days. The patient has had similar episodes like this in the past, but they have not lasted this long.   The patient states that he does not have any headaches, fever, chills, sob, chest pain, or abdominal pain.   The patient has had a recent hospitalization 12/13/16-12/14/16 for orthostatic hypotension. He was given iv fluids and told to hold ramipril, torsemide, and imdur. Diltiazem was discontinued and metoprolol was increased to 50mg .   ED Course: Normotensive, normal pulse, normal respiration, afebrile Urinalysis pending  BMP and cbc without any significant lab abnormalities EKG-no st or t wave changes Chest x-ray- no acute cardiopulmonary changes   Meds:  Current Meds  Medication Sig  . acetaminophen (TYLENOL) 500 MG tablet Take 500 mg by mouth every 6 (six) hours as needed for headache.  . albuterol (PROAIR HFA) 108 (90 Base) MCG/ACT inhaler Inhale 1-2 puffs into the lungs every 4 (four) hours as needed for shortness of breath.  Marland Kitchen aspirin EC 81 MG tablet Take 81 mg by mouth  daily.   . clopidogrel (PLAVIX) 75 MG tablet Take 1 tablet (75 mg total) by mouth daily.  . Cyanocobalamin (VITAMIN B-12 PO) Place 1 mL under the tongue at bedtime.   Marland Kitchen escitalopram (LEXAPRO) 10 MG tablet Take 1 tablet (10 mg total) at bedtime by mouth.  Marland Kitchen glipiZIDE (GLUCOTROL) 5 MG tablet Take 1 tablet (5 mg total) daily before breakfast by mouth.  . Glycopyrrolate-Formoterol (BEVESPI AEROSPHERE) 9-4.8 MCG/ACT AERO Inhale 2 puffs into the lungs 2 (two) times daily.  Marland Kitchen levothyroxine (SYNTHROID, LEVOTHROID) 25 MCG tablet Take 25 mcg by mouth daily before breakfast.  . metoprolol succinate (TOPROL-XL) 25 MG 24 hr tablet Take 2 tablets (50 mg total) by mouth daily. Take with or immediately following a meal.  . Multiple Vitamin (MULTIVITAMIN WITH MINERALS) TABS tablet Take 1 tablet by mouth daily.   . nitroGLYCERIN (NITROSTAT) 0.4 MG SL tablet Place 0.4 mg under the tongue daily as needed for chest pain.   . ranitidine (ZANTAC) 150 MG tablet Take 1 tablet (150 mg total) by mouth 2 (two) times daily.  Marland Kitchen Spacer/Aero Chamber Mouthpiece MISC 1 Device by Does not apply route as directed. (Patient taking differently: 1 Device by Does not apply route 2 (two) times daily. Use with Bevespi and Proair)  . torsemide (DEMADEX) 20 MG tablet Take 20 mg by mouth daily.  Allergies: Allergies as of 12/20/2016 - Review Complete 12/20/2016  Allergen Reaction Noted  . Novocain [procaine] Other (See Comments) 03/04/2015  . Rosuvastatin calcium Other (See Comments) 03/14/2015  . Statins Other (See Comments) 03/04/2015   Past Medical History:  Diagnosis Date  . Anxiety   . Arthritis    "knees, back, shoulders" (11/26/2016)  . CAD (coronary artery disease)    a. remote stenting >20 years ago. b. PTCA unknown vessel ~2013. c. 10/2015: s/p rotational atherectomy/DES to distal RCA and RPDA c/b embolic CVA  . CHB (complete heart block) ([place]) 02/2015   [name] 03/03/2015  . Chronic diastolic CHF (congestive heart  failure) ([place])   . Chronic shoulder pain    "both shoulders" (11/26/2016)  . [name] (chronic kidney disease), stage III ([place])   . COPD (chronic obstructive pulmonary disease) ([place])   . Depression   . Dilated aortic root ([place])    a. mild by echo 09/2015.  Marland Kitchen GERD (gastroesophageal reflux disease)   . Headache    "weekly" (11/26/2016)  . Heart attack [facility]) 'many years ago'  . High cholesterol   . Hypertension   . Hypothyroidism   . OSA (obstructive sleep apnea)    "suppose to have a mask; they haven't got one adjusted for him yet" (11/26/2016)  . Presence of permanent cardiac pacemaker   . 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 Skin cancer of face    "had it cut off"  . Statin intolerance   . Stroke [facility]) 10/2015   "mini stroke; been having memory issues & confusion ever since" (11/26/2016)  . Symptomatic bradycardia    a. syncope/sinus arrest and bradycardia s/p St. Jude PPM 02/2015 with lead revision 03/2015.    Family History:  Family History  Problem Relation Age of Onset  . Heart disease Father   . Heart disease Mother   . Diabetes Sister   . Cancer Sister   . Diabetes Brother   . Diabetes Sister   . Lung disease Neg Hx      Social History:  Social History   Socioeconomic History  . Marital status: Married    Spouse name: None  . Number of children: None  . Years of education: None  . Highest education level: None  Social Needs  . Financial resource strain: None  . Food insecurity - worry: None  . Food insecurity - inability: None  . Transportation needs - medical: None  . Transportation needs - non-medical: None  Occupational History  . None  Tobacco Use  . Smoking status: Former Smoker    Packs/day: 1.00    Years: 11.00    Pack years: 11.00    Types: Cigarettes    Start date: 09/09/1947    Last attempt to quit: 09/09/1958    Years since quitting: 58.3  . Smokeless tobacco: Never Used  . Tobacco comment: Significant second-hand exposure. Wife also smokes around him.   Substance and Sexual Activity  . Alcohol use: Yes    Comment: 11/26/2016 ""nothing since 12/2010"  . Drug use: No  . Sexual activity: Not Currently  Other Topics Concern  . None  Social History Narrative   [facility] (10/25/16):   Originally from [place]. Has lived in [place] most of his life. He was a Company secretary. No pets currently. No bird exposure. No mold exposure. Previously like going camping. 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 Significant exposure to dust, etc through his work. No known asbestos exposure.  Review of Systems: A complete ROS was negative except as per HPI.   Physical Exam: Blood pressure 122/77, pulse 75, temperature (!) 97.5 F (36.4 C), temperature source Oral, resp. rate 19, height 6\' 1"  (1.854 m), weight 220 lb 7.4 oz (100 kg), SpO2 99 %.  Physical Exam  Constitutional: He appears well-developed and well-nourished. No distress.  HENT:  Head: Normocephalic and atraumatic.  Eyes: Conjunctivae are normal.  Neck:  No jvd visualized  Cardiovascular: Normal rate, regular rhythm, normal heart sounds and intact distal pulses.  Respiratory: Effort normal and breath sounds normal. No respiratory distress. He has no wheezes.  GI: Soft. Bowel sounds are normal. He exhibits no distension. There is no tenderness.  Musculoskeletal: He exhibits no edema (no pitting).  Neurological: He is alert. No cranial nerve deficit.  Skin: He is not diaphoretic.  Psychiatric: He has a normal mood and affect. His behavior is normal. Judgment and thought content normal.   CMP     Component Value Date/Time   NA 136 12/20/2016 1256   K 3.9 12/20/2016 1256   CL 105 12/20/2016 1256   CO2 26 12/20/2016 1256   GLUCOSE 141 (H) 12/20/2016 1256   BUN 14 12/20/2016 1256   CREATININE 1.35 (H) 12/20/2016 1256   CREATININE 1.37 (H) 10/16/2015 1453   CALCIUM 9.0 12/20/2016 1256   PROT 6.7 12/20/2016 1256   ALBUMIN 3.1 (L) 12/20/2016 1256   AST 40 12/20/2016  1256   ALT 47 12/20/2016 1256   ALKPHOS 105 12/20/2016 1256   BILITOT 0.4 12/20/2016 1256   GFRNONAA 49 (L) 12/20/2016 1256   GFRAA 56 (L) 12/20/2016 1256   BMP Latest Ref Rng & Units 12/20/2016 12/14/2016 12/13/2016  Glucose 65 - 99 mg/dL 141(H) 116(H) 105(H)  BUN 6 - 20 mg/dL 14 23(H) 28(H)  Creatinine 0.61 - 1.24 mg/dL 1.35(H) 1.53(H) 1.97(H)  Sodium 135 - 145 mmol/L 136 135 134(L)  Potassium 3.5 - 5.1 mmol/L 3.9 4.0 3.7  Chloride 101 - 111 mmol/L 105 104 101  CO2 22 - 32 mmol/L 26 23 24   Calcium 8.9 - 10.3 mg/dL 9.0 8.6(L) 9.2    EKG: personally reviewed my interpretation is atrially paced regular rhythm, no st or t wave changes  CXR: personally reviewed my interpretation is pacer visualized, elevation of right hemidiaphragm, no consolidation or effusion.  Assessment & Plan by Problem:  76 y.o m with cad, chf, htn, hld, osa who presented with altered mental status for past 3 days.  Acute encephalopathy  The patient presents with 3 day history of confusion and unsteady gait. He does not have any laboratory abnormalities or imaging abnormalities. Chest x-ray did not show any consolidation or effusion to suggest pneumonia or other infection as contributory to the patients ams. The patient does not have any leukocytosis or fever to suggest systemic infection. Urinalysis is negative for any nitrites or leukocytes.  The patient's ams is likely due to dementia. The patient maybe having vascular dementia that is causing a stepwise cognitive decline.  -MOCA suggested -longterm follow up for dementia  HFpEF The patient does not appear volume overloaded on exam. He did not have any jvp, crackles per auscultation, pitting edema, or any weight changes. The patient's last echo was in October 2018 when he had a ef of 60-65%, no regional wall motion abnormalities, moderately calcified aortic leaflet, mildly calcified mitral annulus, and grade 1 diastolic dysfunction.  -continue torsemide 20mg   daily  COPD Restrictive lung disease The patient's last pulmonary function tests  were done 11/02/16. The pft's showed a restrictive pattern of disease with FEV1/FVC pre=80%, FEV1/FVC post=86%, and DLCO=42%. -continue brovana  -continue ellipta  CAD -continue aspirin 81mg   -continue plavix 75mg  daily -continue metoprolol 50mg  daily  Hypertension The patient's blood pressure since admission has ranged 121-147/88-97.  -continue metoprolol 50mg  qd  Diabetes Mellitus The patient's last HgA1C=7.7.  -SSI -CBG monitoring  Dispo: Admit patient to Inpatient with expected length of stay greater than 2 midnights.  Signed: Lars Mage, MD Internal Medicine PGY1 [phone] 12/20/2016, 8:18 PM

## 2016-12-20 NOTE — ED Notes (Signed)
Pt went to X-ray.   

## 2016-12-21 ENCOUNTER — Encounter (HOSPITAL_COMMUNITY): Payer: Self-pay | Admitting: General Practice

## 2016-12-21 ENCOUNTER — Other Ambulatory Visit: Payer: Self-pay

## 2016-12-21 DIAGNOSIS — G9340 Encephalopathy, unspecified: Secondary | ICD-10-CM

## 2016-12-21 DIAGNOSIS — I10 Essential (primary) hypertension: Secondary | ICD-10-CM | POA: Diagnosis not present

## 2016-12-21 DIAGNOSIS — R410 Disorientation, unspecified: Secondary | ICD-10-CM

## 2016-12-21 DIAGNOSIS — Z888 Allergy status to other drugs, medicaments and biological substances status: Secondary | ICD-10-CM

## 2016-12-21 DIAGNOSIS — Z79899 Other long term (current) drug therapy: Secondary | ICD-10-CM | POA: Diagnosis not present

## 2016-12-21 MED ORDER — DONEPEZIL HCL 5 MG PO TABS: 5.0000 mg | ORAL_TABLET | Freq: Every day | ORAL | 0 refills | Status: DC

## 2016-12-21 MED ORDER — DONEPEZIL HCL 5 MG PO TABS: 5.0000 mg | ORAL_TABLET | Freq: Every day | ORAL | Status: DC

## 2016-12-21 NOTE — Care Management Note (Signed)
Case Management Note  Patient Details  Name: [name] MRN: [id number] Date of Birth: Jul 27, 1940  Subjective/Objective:                 Spoke w patient wife at bedside. She states they have [facility] for PT RN. They would like to continue after DC.    Action/Plan:  Will need resumption orders and referral at DC. Expected Discharge Date:                  Expected Discharge Plan:  [name]  In-House Referral:     Discharge planning Services  CM Consult  Post Acute Care Choice:    Choice offered to:     DME Arranged:    DME Agency:     HH Arranged:    Price Agency:     Status of Service:  In process, will continue to follow  If discussed at Long Length of Stay Meetings, dates discussed:    Additional Comments:  Carles Collet, RN 12/21/2016, 9:37 AM

## 2016-12-21 NOTE — Progress Notes (Signed)
  Date: 12/21/2016  Patient name: [name]  Medical record number: [id number]  Date of birth: [birth year]   I have seen and evaluated [name] and discussed their care with the Residency Team. Briefly, [name] is a 76 yo man with PMH of CAD, CHF, COPD, HTN, [name], OSA and h/o TIA who presented with increased confusion.  His wife reported to our team today that he had a blank stare and was more confused then normal, not recognizing her.  She was concerned that something may be happening.  He has had similar episodes in the past but none that have lasted this long.  Initial work up did not show any significant issues with electrolytes.  He did have a low pulse when he initially presented, but this quickly resolved.  Laboratory findings showed [name] which is known, a mild hyperglycemia and a mild hypoalbuminemia.  CBC showed known normocytic anemia which has not changed significantly from baseline.  There has been concern for him having dementia, but he has not been formally assessed.  Previous MOCA score was 15.  He has neurology follow up in January.  On discussion with his wife, she is aware that this is likely dementia, possibly vascular vs. Alzheimers.  She has worked with patient's like this before and sees the signs in him of increased confusion and forgetfulness.     Vitals:   12/21/16 0912 12/21/16 0915  BP:    Pulse:    Resp:    Temp:    SpO2: 97% 97%   Physical Exam:  Gen: Sleeping, but awake to voice and conversant, oriented to person and knew who his wife was Eyes: Anicteric sclerae, no conjunctival injection.  Neck: Supple CV: RR, NR, no murmur Pulm: Easy work of breathing, no wheezing Abd: Soft, NT, +BS Ext: No edema, no Hendrix Skin: Flaking skin, SKs on face   Assessment and Plan: I have seen and evaluated the patient as outlined above. I agree with the formulated Assessment and Plan as detailed in the residents' note, with the following changes:   1. AMS - I  agree with the team that this is likely worsening dementia.  He is sensitive to medications so his BP medications should be monitored closely given his history of orthostatic hypotension - Follow up with Neurology planned - Discuss with family re: Starting donepazil, but with the caveat that this may not help much in mod-severe dementia - Trend labs and vitals to assess for any metabolic or organic cause as indicated  Other issues per Dr. Allayne Gitelman daily note.   Brandon Falcon, MD 12/11/201812:10 PM

## 2016-12-21 NOTE — H&P (Signed)
Date: 12/20/2016               Patient Name:  [name] MRN: [id number]  DOB: 08-Jan-1941 Age / Sex: 76 y.o., male   PCP: Lars Mage, MD         Medical Service: Internal Medicine Teaching Service         Attending Physician: Dr. Sid Falcon, MD    First Contact: Dr. Lars Mage Pager: [phone]  Second Contact: Dr. Alphonzo Grieve Pager: [phone]       After Hours (After 5p/  First Contact Pager: [phone]  weekends / holidays): Second Contact Pager: [phone]   Chief Complaint: Altered mental status  History of Present Illness: Mr. Bong is a 76 year old male with pmh of cad, chf, copd, htn, hld, osa, and tia who presented with altered mental status and he was not oriented enough to provide an accurate history. A large part of the history was gathered on chart review. The patient's wife called IM stating that her husband has been confused, stumbling, and dizziness for the past 2 days. He was confused about where he was in the morning and the patient's wife reports that the confusion is worse right after he wakes up. The patient has had similar episodes like this in the past, but they have not lasted this long. The patient has apparently also had decreased energy levels.   The patient states that he does not have any headaches, fever, chills, sob, chest pain, or abdominal pain.   The patient has had a recent hospitalization 12/13/16-12/14/16 for orthostatic hypotension. He was given iv fluids and ramipril, torsemide, and imdur were held during the hospitalization. Diltiazem was discontinued and metoprolol was increased to 50mg . The patient's wife believes that restarting torsemide after the hospitalization is contributing to the symptoms.   ED Course: Normotensive, normal pulse, normal respiration, afebrile Urinalysis pending  BMP and cbc without any significant lab abnormalities EKG-no st or t wave changes Chest x-ray- no acute cardiopulmonary changes   Meds:    Current Meds  Medication Sig  . acetaminophen (TYLENOL) 500 MG tablet Take 500 mg by mouth every 6 (six) hours as needed for headache.  . albuterol (PROAIR HFA) 108 (90 Base) MCG/ACT inhaler Inhale 1-2 puffs into the lungs every 4 (four) hours as needed for shortness of breath.  Marland Kitchen aspirin EC 81 MG tablet Take 81 mg by mouth daily.   . clopidogrel (PLAVIX) 75 MG tablet Take 1 tablet (75 mg total) by mouth daily.  . Cyanocobalamin (VITAMIN B-12 PO) Place 1 mL under the tongue at bedtime.   Marland Kitchen escitalopram (LEXAPRO) 10 MG tablet Take 1 tablet (10 mg total) at bedtime by mouth.  Marland Kitchen glipiZIDE (GLUCOTROL) 5 MG tablet Take 1 tablet (5 mg total) daily before breakfast by mouth.  . Glycopyrrolate-Formoterol (BEVESPI AEROSPHERE) 9-4.8 MCG/ACT AERO Inhale 2 puffs into the lungs 2 (two) times daily.  Marland Kitchen levothyroxine (SYNTHROID, LEVOTHROID) 25 MCG tablet Take 25 mcg by mouth daily before breakfast.  . metoprolol succinate (TOPROL-XL) 25 MG 24 hr tablet Take 2 tablets (50 mg total) by mouth daily. Take with or immediately following a meal.  . Multiple Vitamin (MULTIVITAMIN WITH MINERALS) TABS tablet Take 1 tablet by mouth daily.   . nitroGLYCERIN (NITROSTAT) 0.4 MG SL tablet Place 0.4 mg under the tongue daily as needed for chest pain.   . ranitidine (ZANTAC) 150 MG tablet Take 1 tablet (150 mg total) by mouth 2 (two) times daily.  Marland Kitchen  Spacer/Aero Chamber Mouthpiece MISC 1 Device by Does not apply route as directed. (Patient taking differently: 1 Device by Does not apply route 2 (two) times daily. Use with Bevespi and Proair)  . torsemide (DEMADEX) 20 MG tablet Take 20 mg by mouth daily.      Allergies: Allergies as of 12/20/2016 - Review Complete 12/20/2016  Allergen Reaction Noted  . Novocain [procaine] Other (See Comments) 03/04/2015  . Rosuvastatin calcium Other (See Comments) 03/14/2015  . Statins Other (See Comments) 03/04/2015   Past Medical History:  Diagnosis Date  . Anxiety   . Arthritis     "knees, back, shoulders" (11/26/2016)  . CAD (coronary artery disease)    a. remote stenting >20 years ago. b. PTCA unknown vessel ~2013. c. 10/2015: s/p rotational atherectomy/DES to distal RCA and RPDA c/b embolic CVA  . CHB (complete heart block) ([place]) 02/2015   [name] 03/03/2015  . Chronic diastolic CHF (congestive heart failure) ([place])   . Chronic shoulder pain    "both shoulders" (11/26/2016)  . [name] (chronic kidney disease), stage III ([place])   . COPD (chronic obstructive pulmonary disease) ([place])   . Depression   . Dilated aortic root ([place])    a. mild by echo 09/2015.  Marland Kitchen GERD (gastroesophageal reflux disease)   . Headache    "weekly" (11/26/2016)  . Heart attack [facility]) 'many years ago'  . High cholesterol   . Hypertension   . Hypothyroidism   . OSA (obstructive sleep apnea)    "suppose to have a mask; they haven't got one adjusted for him yet" (11/26/2016)  . Presence of permanent cardiac pacemaker   . 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 Skin cancer of face    "had it cut off"  . Statin intolerance   . Stroke [facility]) 10/2015   "mini stroke; been having memory issues & confusion ever since" (11/26/2016)  . Symptomatic bradycardia    a. syncope/sinus arrest and bradycardia s/p St. Jude PPM 02/2015 with lead revision 03/2015.    Family History:  Family History  Problem Relation Age of Onset  . Heart disease Father   . Heart disease Mother   . Diabetes Sister   . Cancer Sister   . Diabetes Brother   . Diabetes Sister   . Lung disease Neg Hx      Social History:  Social History   Socioeconomic History  . Marital status: Married    Spouse name: None  . Number of children: None  . Years of education: None  . Highest education level: None  Social Needs  . Financial resource strain: None  . Food insecurity - worry: None  . Food insecurity - inability: None  . Transportation needs - medical: None  . Transportation needs - non-medical: None  Occupational History  . None    Tobacco Use  . Smoking status: Former Smoker    Packs/day: 1.00    Years: 11.00    Pack years: 11.00    Types: Cigarettes    Start date: 09/09/1947    Last attempt to quit: 09/09/1958    Years since quitting: 58.3  . Smokeless tobacco: Never Used  . Tobacco comment: Significant second-hand exposure. Wife also smokes around him.  Substance and Sexual Activity  . Alcohol use: Yes    Comment: 11/26/2016 ""nothing since 12/2010"  . Drug use: No  . Sexual activity: Not Currently  Other Topics Concern  . None  Social History Narrative   [facility] (10/25/16):   Originally from [place].  Has lived in [place] most of his life. He was a Company secretary. No pets currently. No bird exposure. No mold exposure. Previously like going camping. 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 Significant exposure to dust, etc through his work. No known asbestos exposure.     Review of Systems: A complete ROS was negative except as per HPI.   Physical Exam: Blood pressure 122/77, pulse 75, temperature (!) 97.5 F (36.4 C), temperature source Oral, resp. rate 19, height 6\' 1"  (1.854 m), weight 220 lb 7.4 oz (100 kg), SpO2 99 %.  Physical Exam  Constitutional: He appears well-developed and well-nourished. No distress.  HENT:  Head: Normocephalic and atraumatic.  Eyes: Conjunctivae are normal.  Neck:  No jvd visualized  Cardiovascular: Normal rate, regular rhythm, normal heart sounds and intact distal pulses.  Respiratory: Effort normal and breath sounds normal. No respiratory distress. He has no wheezes.  GI: Soft. Bowel sounds are normal. He exhibits no distension. There is no tenderness.  Musculoskeletal: He exhibits no edema (no pitting).  Neurological: He is alert. No cranial nerve deficit.  Skin: He is not diaphoretic.  Psychiatric: He has a normal mood and affect. His behavior is normal. Judgment and thought content normal.   CMP     Component Value Date/Time   NA 136  12/20/2016 1256   K 3.9 12/20/2016 1256   CL 105 12/20/2016 1256   CO2 26 12/20/2016 1256   GLUCOSE 141 (H) 12/20/2016 1256   BUN 14 12/20/2016 1256   CREATININE 1.35 (H) 12/20/2016 1256   CREATININE 1.37 (H) 10/16/2015 1453   CALCIUM 9.0 12/20/2016 1256   PROT 6.7 12/20/2016 1256   ALBUMIN 3.1 (L) 12/20/2016 1256   AST 40 12/20/2016 1256   ALT 47 12/20/2016 1256   ALKPHOS 105 12/20/2016 1256   BILITOT 0.4 12/20/2016 1256   GFRNONAA 49 (L) 12/20/2016 1256   GFRAA 56 (L) 12/20/2016 1256   BMP Latest Ref Rng & Units 12/20/2016 12/14/2016 12/13/2016  Glucose 65 - 99 mg/dL 141(H) 116(H) 105(H)  BUN 6 - 20 mg/dL 14 23(H) 28(H)  Creatinine 0.61 - 1.24 mg/dL 1.35(H) 1.53(H) 1.97(H)  Sodium 135 - 145 mmol/L 136 135 134(L)  Potassium 3.5 - 5.1 mmol/L 3.9 4.0 3.7  Chloride 101 - 111 mmol/L 105 104 101  CO2 22 - 32 mmol/L 26 23 24   Calcium 8.9 - 10.3 mg/dL 9.0 8.6(L) 9.2    EKG: personally reviewed my interpretation is atrially paced regular rhythm, no st or t wave changes  CXR: personally reviewed my interpretation is pacer visualized, elevation of right hemidiaphragm, no consolidation or effusion.  Assessment & Plan by Problem:  76 y.o m with cad, chf, htn, hld, osa who presented with altered mental status for past 3 days.  Acute encephalopathy  The patient presents with 3 day history of confusion and unsteady gait. He does not have any laboratory abnormalities or imaging abnormalities. Chest x-ray did not show any consolidation or effusion to suggest pneumonia or other infection as contributory to the patients ams. The patient does not have any leukocytosis or fever to suggest systemic infection. Urinalysis is negative for any nitrites or leukocytes.  The patient's ams is likely due to dementia. The patient maybe having vascular dementia that is causing a stepwise cognitive decline.  -MOCA suggested -longterm follow up for dementia  HFpEF The patient does not appear volume  overloaded on exam. He did not have any jvp, crackles per auscultation, pitting edema, or  any weight changes. The patient's last echo was in October 2018 when he had a ef of 60-65%, no regional wall motion abnormalities, moderately calcified aortic leaflet, mildly calcified mitral annulus, and grade 1 diastolic dysfunction.  -continue torsemide 20mg  daily  COPD Restrictive lung disease The patient's last pulmonary function tests were done 11/02/16. The pft's showed a restrictive pattern of disease with FEV1/FVC pre=80%, FEV1/FVC post=86%, and DLCO=42%. -continue brovana  -continue ellipta  CAD -continue aspirin 81mg   -continue plavix 75mg  daily -continue metoprolol 50mg  daily  Hypertension The patient's blood pressure since admission has ranged 121-147/88-97.  -continue metoprolol 50mg  qd  Diabetes Mellitus The patient's last HgA1C=7.7.  -SSI -CBG monitoring  Dispo: Admit patient to Inpatient with expected length of stay greater than 2 midnights.  Signed: Lars Mage, MD Internal Medicine PGY1 Pager:[phone] 12/20/2016, 8:18 PM

## 2016-12-21 NOTE — Discharge Summary (Signed)
Name: [name] MRN: [id number] DOB: 1940-10-18 76 y.o. PCP: Lars Mage, MD  Date of Admission: 12/20/2016 12:56 PM Date of Discharge: 12/21/2016 Attending Physician: Sid Falcon, MD  Discharge Diagnosis: 1. Acute encephalopathy  Active Problems:   Encephalopathy acute   Discharge Medications: Allergies as of 12/21/2016      Reactions   Novocain [procaine] Other (See Comments)   Hallucinations   Rosuvastatin Calcium Other (See Comments)   Whole body ache/pain   Statins Other (See Comments)   Whole body ache/pain      Medication List    TAKE these medications   acetaminophen 500 MG tablet Commonly known as:  TYLENOL Take 500 mg by mouth every 6 (six) hours as needed for headache.   aspirin EC 81 MG tablet Take 81 mg by mouth daily.   clopidogrel 75 MG tablet Commonly known as:  PLAVIX Take 1 tablet (75 mg total) by mouth daily.   donepezil 5 MG tablet Commonly known as:  ARICEPT Take 1 tablet (5 mg total) by mouth at bedtime.   escitalopram 10 MG tablet Commonly known as:  LEXAPRO Take 1 tablet (10 mg total) at bedtime by mouth.   glipiZIDE 5 MG tablet Commonly known as:  GLUCOTROL Take 1 tablet (5 mg total) daily before breakfast by mouth.   Glycopyrrolate-Formoterol 9-4.8 MCG/ACT Aero Commonly known as:  BEVESPI AEROSPHERE Inhale 2 puffs into the lungs 2 (two) times daily.   levothyroxine 25 MCG tablet Commonly known as:  SYNTHROID, LEVOTHROID Take 25 mcg by mouth daily before breakfast.   metoprolol succinate 25 MG 24 hr tablet Commonly known as:  TOPROL-XL Take 2 tablets (50 mg total) by mouth daily. Take with or immediately following a meal.   multivitamin with minerals Tabs tablet Take 1 tablet by mouth daily.   nitroGLYCERIN 0.4 MG SL tablet Commonly known as:  NITROSTAT Place 0.4 mg under the tongue daily as needed for chest pain.   PROAIR HFA 108 (90 Base) MCG/ACT inhaler Generic drug:  albuterol Inhale 1-2 puffs into  the lungs every 4 (four) hours as needed for shortness of breath.   ranitidine 150 MG tablet Commonly known as:  ZANTAC Take 1 tablet (150 mg total) by mouth 2 (two) times daily.   Spacer/Aero Chamber Mouthpiece Misc 1 Device by Does not apply route as directed. What changed:    when to take this  additional instructions   torsemide 20 MG tablet Commonly known as:  DEMADEX Take 20 mg by mouth daily.   VITAMIN B-12 PO Place 1 mL under the tongue at bedtime.       Disposition and follow-up:   [name] was discharged from [facility] in Stable condition.  At the hospital follow up visit please address:  1.  - Did he follow up with Neurology, scheduled 01/19/2017?  2.  Normotensive during this admission and on previous admission, there was concern for orthostatic hypotension, so some BP medications were held. Resumed metoprolol and torsemide. For restarting BP medications, we recommend restarting medications in this order for CP and volume overload control: (1) isosorbide mononitrate, (2) ramipril.  3.  Patient may be a good candidate for PACE. Please provide information regarding the PACE program and discuss with patient and his wife regarding the benefits. PACE would take over as his PCP, but wife noted that she really wanted all of the patient's care to be at one place, which may make PACE a great option. Consider PACE referral.  4.  Labs / imaging needed at time of follow-up: Check BP and HR  5.  Pending labs/ test needing follow-up: None  Follow-up Appointments: Follow-up Information    Chundi, Verne Spurr, MD. Schedule an appointment as soon as possible for a visit in 1 week(s).   Specialty:  Internal Medicine Why:  Please call the clinic to schedule an appointment for next week to follow up on blood pressure. Contact information: [place] [id number] [phone]        Celene Squibb., MD. Go on 01/19/2017.   Specialty:   Neurology Contact information: [address] [place] [address] [phone]           Hospital Course by problem list: Active Problems:   Encephalopathy acute   1. Acute encephalopathy Mr. Leavy presented with 3 days of worsening confusion and unsteady gait. Wife states that he has had a gradual cognitive decline in the past 1-2 years, "ever since his mini-stroke". He has had episodes of confusion in the past, but nothing that persisted like this episode. Labs and imaging, including CBC, CMP, UA, and CXR were reassuring. Patient denied any infectious signs or symptoms and afebrile with stable vital signs. Patient had a [place] performed during his October admission and scored a 15, suggesting moderate-severe dementia. His episodic confusion is likely secondary to dementia. Patient monitored overnight with no acute events and resolution of his confusion the next morning. We started donepezil 5mg  daily on discharge. Patient discharged in stable condition with plan to follow up with Neurology outpatient on 1/9.  2. Hx of HTN BP normotensive while in the hospital. Metoprolol restarted on his previous discharge. Torsemide resumed in clinic. Continuing to hold ramipril and isosorbide mononitrate secondary to normotensive BP in the hospital. Please restart these medications at outpatient f/u as indicated.  Discharge Vitals:   BP 110/70   Pulse 65   Temp 98.2 F (36.8 C) (Oral)   Resp 16   Ht 6\' 1"  (1.854 m)   Wt 220 lb 0.3 oz (99.8 kg)   SpO2 97%   BMI 29.03 kg/m   Pertinent Labs, Studies, and Procedures:  CBC Latest Ref Rng & Units 12/20/2016 12/14/2016 12/14/2016  WBC 4.0 - 10.5 K/uL 5.8 7.4 6.2  Hemoglobin 13.0 - 17.0 g/dL 9.9(L) 9.1(L) 8.6(L)  Hematocrit 39.0 - 52.0 % 31.9(L) 29.2(L) 27.4(L)  Platelets 150 - 400 K/uL 188 198 193   CMP Latest Ref Rng & Units 12/20/2016 12/14/2016 12/13/2016  Glucose 65 - 99 mg/dL 141(H) 116(H) 105(H)  BUN 6 - 20 mg/dL 14 23(H)  28(H)  Creatinine 0.61 - 1.24 mg/dL 1.35(H) 1.53(H) 1.97(H)  Sodium 135 - 145 mmol/L 136 135 134(L)  Potassium 3.5 - 5.1 mmol/L 3.9 4.0 3.7  Chloride 101 - 111 mmol/L 105 104 101  CO2 22 - 32 mmol/L 26 23 24   Calcium 8.9 - 10.3 mg/dL 9.0 8.6(L) 9.2  Total Protein 6.5 - 8.1 g/dL 6.7 - -  Total Bilirubin 0.3 - 1.2 mg/dL 0.4 - -  Alkaline Phos 38 - 126 U/L 105 - -  AST 15 - 41 U/L 40 - -  ALT 17 - 63 U/L 47 - -   UA negative for UTI  CXR 12/20/2016 Enlargement of cardiac silhouette post pacemaker. No acute abnormalities.  Discharge Instructions: Discharge Instructions    (HEART FAILURE PATIENTS) Call MD:  Anytime you have any of the following symptoms: 1) 3 pound weight gain in 24 hours or 5 pounds  in 1 week 2) shortness of breath, with or without a dry hacking cough 3) swelling in the hands, feet or stomach 4) if you have to sleep on extra pillows at night in order to breathe.   Complete by:  As directed    Call MD for:  difficulty breathing, headache or visual disturbances   Complete by:  As directed    Call MD for:  extreme fatigue   Complete by:  As directed    Call MD for:  persistant dizziness or light-headedness   Complete by:  As directed    Call MD for:  persistant nausea and vomiting   Complete by:  As directed    Call MD for:  severe uncontrolled pain   Complete by:  As directed    Call MD for:  temperature >100.4   Complete by:  As directed    Diet - low sodium heart healthy   Complete by:  As directed    Increase activity slowly   Complete by:  As directed      Signed: Colbert Ewing, MD 12/21/2016, 11:23 AM   Pager: Mamie Nick [phone]

## 2016-12-21 NOTE — Discharge Instructions (Signed)
Please start taking donepezil 5mg  daily. Like we discussed, this does not cure dementia. It may help slow down the process, but that is not certain. And unfortunately, dementia is a chronic progressive disease. Please make sure to follow up with your Neurologist as scheduled on January 19, 2017.  Please continue taking torsemide 20mg  daily and metoprolol 50mg  daily. Please follow up with the clinic downstairs for management of your blood pressure.   Dementia Caregiver Guide Dementia is a term used to describe a number of symptoms that affect memory and thinking. The most common symptoms include:  Memory loss.  Trouble with language and communication.  Trouble concentrating.  Poor judgment.  Problems with reasoning.  Child-like behavior and language.  Extreme anxiety.  Angry outbursts.  Wandering from home or public places.  Dementia usually gets worse slowly over time. In the early stages, people with dementia can stay independent and safe with some help. In later stages, they need help with daily tasks such as dressing, grooming, and using the bathroom. How to help the person with dementia cope Dementia can be frightening and confusing. Here are some tips to help the person with dementia cope with changes caused by the disease. General tips  Keep the person on track with his or her routine.  Try to identify areas where the person may need help.  Be supportive, patient, calm, and encouraging.  Gently remind the person that adjusting to changes takes time.  Help with the tasks that the person has asked for help with.  Keep the person involved in daily tasks and decisions as much as possible.  Encourage conversation, but try not to get frustrated or harried if the person struggles to find words or does not seem to appreciate your help. Communication tips  When the person is talking or seems frustrated, make eye contact and hold the person's hand.  Ask specific questions  that need yes or no answers.  Use simple words, short sentences, and a calm voice. Only give one direction at a time.  When offering choices, limit them to just 1 or 2.  Avoid correcting the person in a negative way.  If the person is struggling to find the right words, gently try to help him or her. How to recognize symptoms of stress Symptoms of stress in caregivers include:  Feeling frustrated or angry with the person with dementia.  Denying that the person has dementia or that his or her symptoms will not improve.  Feeling hopeless and unappreciated.  Difficulty sleeping.  Difficulty concentrating.  Feeling anxious, irritable, or depressed.  Developing stress-related health problems.  Feeling like you have too little time for your own life.  Follow these instructions at home:  Make sure that you and the person you are caring for: ? Get regular sleep. ? Exercise regularly. ? Eat regular, nutritious meals. ? Drink enough fluid to keep your urine clear or pale yellow. ? Take over-the-counter and prescription medicines only as told by your health care providers. ? Attend all scheduled health care appointments.  Join a support group with others who are caregivers.  Ask about respite care resources so that you can have a regular break from the stress of caregiving.  Look for signs of stress in yourself and in the person you are caring for. If you notice signs of stress, take steps to manage it.  Consider any safety risks and take steps to avoid them.  Organize medications in a pill box for each day of  the week.  Create a plan to handle any legal or financial matters. Get legal or financial advice if needed.  Keep a calendar in a central location to remind the person of appointments or other activities. Tips for reducing the risk of injury  Keep floors clear of clutter. Remove rugs, magazine racks, and floor lamps.  Keep hallways well lit, especially at  night.  Put a handrail and nonslip mat in the bathtub or shower.  Put childproof locks on cabinets that contain dangerous items, such as medicines, alcohol, guns, toxic cleaning items, sharp tools or utensils, matches, and lighters.  Put the locks in places where the person cannot see or reach them easily. This will help ensure that the person does not wander out of the house and get lost.  Be prepared for emergencies. Keep a list of emergency phone numbers and addresses in a convenient area.  Remove car keys and lock garage doors so that the person does not try to get in the car and drive.  Have the person wear a bracelet that tracks locations and identifies the person as having memory problems. This should be worn at all times for safety. Where to find support: Many individuals and organizations offer support. These include:  Support groups for people with dementia and for caregivers.  Counselors or therapists.  Home health care services.  Adult day care centers.  Where to find more information: Alzheimer's Association: [employer] Contact a health care provider if:  The person's health is rapidly getting worse.  You are no longer able to care for the person.  Caring for the person is affecting your physical and emotional health.  The person threatens himself or herself, you, or anyone else. Summary  Dementia is a term used to describe a number of symptoms that affect memory and thinking.  Dementia usually gets worse slowly over time.  Take steps to reduce the person's risk of injury, and to plan for future care.  Caregivers need support, relief from caregiving, and time for their own lives. This information is not intended to replace advice given to you by your health care provider. Make sure you discuss any questions you have with your health care provider. Document Released: 12/02/2015 Document Revised: 12/02/2015 Document Reviewed: 12/02/2015 Elsevier Interactive  Patient Education  [employer].

## 2016-12-21 NOTE — Progress Notes (Signed)
Subjective:  [name] was lying in bed comfortably this morning. Able to tell us that his wife was at bedside. Denies chest pain, shortness of breath, or other complaints. He does not remember what happened yesterday or what brought him into the hospital. Wife states that he has had prior episodes of confusion, but yesterday, he was more confused than normal and noted he was feeling fatigued. She also noted that he had an unsteady gait, but denies dysarthria or focal weakness. She states that his cognitive decline has been gradual for the last 1-2 years (after his TIA).  Objective:  Vital signs in last 24 hours: Vitals:   12/20/16 2057 12/20/16 2146 12/21/16 0500 12/21/16 0514  BP:  119/79  124/83  Pulse:  (!) 58  78  Resp:  16  16  Temp:  98.2 F (36.8 C)  98.2 F (36.8 C)  TempSrc:  Oral  Oral  SpO2: 99% 100%  94%  Weight:   220 lb 0.3 oz (99.8 kg)   Height:       GEN: Pleasant elderly male lying in bed in NAD. Alert and oriented to person (not oriented to year or place, per wife this is around baseline) HENT: [place]/AT. No visible lesions. EYES: Sclera non-icteric. Conjunctiva clear. RESP: Clear to auscultation bilaterally. No wheezes, rales, or rhonchi. No increased work of breathing. CV: Normal rate and regular rhythm. No murmurs, gallops, or rubs. No LE edema. ABD: Soft. Non-tender. Non-distended. Normoactive bowel sounds. EXT: No edema. Warm and well perfused. NEURO: Cranial nerves II-XII grossly intact. Able to lift all four extremities against gravity. No apparent audiovisual hallucinations. Speech fluent and appropriate. PSYCH: Patient is calm and pleasant. Appropriate affect. Well-groomed; speech is appropriate and on-subject.  CBC Latest Ref Rng & Units 12/20/2016 12/14/2016 12/14/2016  WBC 4.0 - 10.5 K/uL 5.8 7.4 6.2  Hemoglobin 13.0 - 17.0 g/dL 9.9(L) 9.1(L) 8.6(L)  Hematocrit 39.0 - 52.0 % 31.9(L) 29.2(L) 27.4(L)  Platelets 150 - 400 K/uL 188 198 193   CMP Latest  Ref Rng & Units 12/20/2016 12/14/2016 12/13/2016  Glucose 65 - 99 mg/dL 141(H) 116(H) 105(H)  BUN 6 - 20 mg/dL 14 23(H) 28(H)  Creatinine 0.61 - 1.24 mg/dL 1.35(H) 1.53(H) 1.97(H)  Sodium 135 - 145 mmol/L 136 135 134(L)  Potassium 3.5 - 5.1 mmol/L 3.9 4.0 3.7  Chloride 101 - 111 mmol/L 105 104 101  CO2 22 - 32 mmol/L 26 23 24   Calcium 8.9 - 10.3 mg/dL 9.0 8.6(L) 9.2  Total Protein 6.5 - 8.1 g/dL 6.7 - -  Total Bilirubin 0.3 - 1.2 mg/dL 0.4 - -  Alkaline Phos 38 - 126 U/L 105 - -  AST 15 - 41 U/L 40 - -  ALT 17 - 63 U/L 47 - -   UA wnl  CXR 12/20/2016 Enlargement of cardiac silhouette post pacemaker. No acute abnormalities.  Assessment/Plan:  Active Problems:   Encephalopathy acute  [name] is a 76yo male with PMH significant for complete heart block s/p PPM, CAD s/p stenting on aspirin and Plavix, mild MCI, COPD, [name], CVA, and chronic diastolic heart failure who was recently discharged from our service on 12/4 who presents with 3 days of episodic AMS.  Acute encephalopathy, likely in the setting of dementia Improved this morning. Labs and imaging reassuring. No infectious s/s. No focal neuro weakness or dysarthria, although he did have an unsteady gait. Patient's wife states that he does not have a formal diagnosis of dementia, but he did receive  a Bowler during his admission in October and he scored a 15, suggestive of moderate-severe dementia. He has an appointment scheduled with Neurology on January 9. - Delirium precautions - Follow up with Neurology as scheduled on 01/19/2017 - stable for discharge today - start donepezil on discharge  HFpEF LVEF 60-65%, grade 1 DD. Torsemide recently restarted at clinic visit secondary to weight gain and increasing LE edema, after it had been held on previous discharge due to orthostatic hypotension/low-normal blood pressures. Weight about ~1lb up since discharge and no LE edema. - Continue torsemide 20mg  daily  COPD Restrictive lung  disease Stable - continue home brovana and ellipta  CAD s/p stents - continue home aspirin, plavix, and metoprolol  HTN BP 124/83 - continue home metoprolol - continuing to hold patient's isosorbide mononitrate and ramipril secondary to normal blood pressures - patient will need outpatient f/u on when to restart these medications  DM2 BG 121. -SSI -CBG monitoring  Dispo: Anticipated discharge today.   Brandon Ewing, MD 12/21/2016, 7:19 AM Pager: Mamie Nick [phone]

## 2016-12-21 NOTE — Care Management Obs Status (Signed)
[place] NOTIFICATION   Patient Details  Name: [name] MRN: [id number] Date of Birth: Apr 18, 1940   Medicare Observation Status Notification Given:  Yes    Carles Collet, RN 12/21/2016, 9:41 AM

## 2016-12-21 NOTE — Progress Notes (Signed)
[name] to be D/C'd to home per MD order.  Discussed with the patient's wife and all questions fully answered.  VSS, Skin clean, dry and intact without evidence of skin break down, no evidence of skin tears noted. IV catheter discontinued intact. Site without signs and symptoms of complications. Dressing and pressure applied.  An After Visit Summary was printed and given to the patient. Patient's wife received prescription.  D/c education completed with patient/family including follow up instructions, medication list, d/c activities limitations if indicated, with other d/c instructions as indicated by MD - patient able to verbalize understanding, all questions fully answered.   Patient instructed to return to ED, call 911, or call MD for any changes in condition.   Patient escorted via [place], and D/C home via private auto.  [name] 12/21/2016 1:03 PM

## 2016-12-21 NOTE — Evaluation (Signed)
Physical Therapy Evaluation Patient Details Name: [name] MRN: [id number] DOB: July 15, 1940 Today's Date: 12/21/2016   History of Present Illness   Pt is a 76 yo male who presented with altered mental status in addition to dyspnea with exertion and weakness. Dx with acute encephalopathy. He has a PMH significant for complete heart block s/p PPM, CAD s/p stenting, pacemaker placement, COPD, [name], CVA, and chronic diastolic heart failure.   Clinical Impression  Pt admitted with above diagnosis. Pt currently with functional limitations due to the deficits listed below (see PT Problem List). Pt is limited in his safe mobility by decreased safety awareness, DoE and generalized weakness in his LE. Pt is currently supervision for bed mobility, minA for transfers and ambulation of 25 feet with RW.  Pt will benefit from skilled PT to increase their independence and safety with mobility to allow discharge to the venue listed below.       Follow Up Recommendations Home health PT;Supervision/Assistance - 24 hour    Equipment Recommendations  None recommended by PT    Recommendations for Other Services       Precautions / Restrictions Precautions Precautions: Fall      Mobility  Bed Mobility Overal bed mobility: Needs Assistance Bed Mobility: Supine to Sit     Supine to sit: Supervision     General bed mobility comments: supervision for safety  Transfers Overall transfer level: Needs assistance Equipment used: Rolling walker (2 wheeled)   Sit to Stand: Min assist         General transfer comment: minA for power up and steadying in RW. vc for hand placement   Ambulation/Gait Ambulation/Gait assistance: Min assist Ambulation Distance (Feet): 25 Feet Assistive device: Rolling walker (2 wheeled) Gait Pattern/deviations: Step-through pattern;Decreased step length - right;Decreased step length - left;Shuffle;Trunk flexed Gait velocity: Decreased Gait velocity interpretation:  Below normal speed for age/gender General Gait Details: requires hands on min guard for steadying with RW, vc for not forgetting RW after tolieting and washing his hands as well as for upright posture and sequencing of RW       Balance Overall balance assessment: Needs assistance Sitting-balance support: No upper extremity supported;Feet supported Sitting balance-Leahy Scale: Good     Standing balance support: Single extremity supported;No upper extremity supported;During functional activity Standing balance-Leahy Scale: Fair Standing balance comment: min guard for pericare and washing hands at sink                             Pertinent Vitals/Pain Pain Assessment: No/denies pain    Home Living Family/patient expects to be discharged to:: Private residence Living Arrangements: Spouse/significant other Available Help at Discharge: Family;Available 24 hours/day Type of Home: Mobile home Home Access: Ramped entrance     Home Layout: One level Home Equipment: [place] - 2 wheels;Cane - single point;Grab bars - tub/shower;Wheelchair - manual      Prior Function Level of Independence: Independent with assistive device(s)         Comments: household ambulation limited by SoB, assist      Hand Dominance   Dominant Hand: Right    Extremity/Trunk Assessment   Upper Extremity Assessment Upper Extremity Assessment: Defer to OT evaluation;Overall [facility] for tasks assessed    Lower Extremity Assessment Lower Extremity Assessment: Generalized weakness    Cervical / Trunk Assessment Cervical / Trunk Assessment: Kyphotic  Communication   Communication: No difficulties  Cognition Arousal/Alertness: Awake/alert Behavior During Therapy: WFL for tasks  assessed/performed Overall Cognitive Status: Impaired/Different from baseline Area of Impairment: Memory;Attention;Orientation;Following commands;Safety/judgement;Awareness;Problem solving                  Orientation Level: Disoriented to;Place;Time;Situation Current Attention Level: Sustained Memory: Decreased short-term memory Following Commands: Follows one step commands with increased time Safety/Judgement: Decreased awareness of safety;Decreased awareness of deficits Awareness: Intellectual Problem Solving: Slow processing;Difficulty sequencing;Requires verbal cues General Comments: Pt's wife reports he has memory deficits at baseline but over the past three days he has had more difficulty with memory. Forgetting his birthday and some of his family members       General Comments General comments (skin integrity, edema, etc.): Pt ambulation limited by SoB, however SaO2 on RA  94%O2, HR max with ambulation of 89 bpm        Assessment/Plan    PT Assessment Patient needs continued PT services  PT Problem List Decreased strength;Decreased activity tolerance;Decreased balance;Decreased mobility;Cardiopulmonary status limiting activity;Decreased safety awareness;Decreased knowledge of use of DME;Decreased cognition       PT Treatment Interventions DME instruction;Gait training;Functional mobility training;Therapeutic activities;Therapeutic exercise;Balance training;Neuromuscular re-education;Cognitive remediation;Patient/family education    PT Goals (Current goals can be found in the Care Plan section)  Acute Rehab PT Goals Patient Stated Goal: breathe better PT Goal Formulation: With patient/family Time For Goal Achievement: 01/04/17 Potential to Achieve Goals: Fair    Frequency Min 3X/week    AM-PAC PT "6 Clicks" Daily Activity  Outcome Measure Difficulty turning over in bed (including adjusting bedclothes, sheets and blankets)?: None Difficulty moving from lying on back to sitting on the side of the bed? : A Little Difficulty sitting down on and standing up from a chair with arms (e.g., wheelchair, bedside commode, etc,.)?: Unable Help needed moving to and from a bed to  chair (including a wheelchair)?: A Little Help needed walking in hospital room?: A Little Help needed climbing 3-5 steps with a railing? : A Lot 6 Click Score: 16    End of Session Equipment Utilized During Treatment: Gait belt Activity Tolerance: Patient tolerated treatment well Patient left: in chair;with call bell/phone within reach;with chair alarm set;with family/visitor present Nurse Communication: Mobility status PT Visit Diagnosis: Unsteadiness on feet (R26.81);Other abnormalities of gait and mobility (R26.89);Muscle weakness (generalized) (M62.81);Difficulty in walking, not elsewhere classified (R26.2);Other symptoms and signs involving the nervous system (R29.898)    Time: 3845-3646 PT Time Calculation (min) (ACUTE ONLY): 24 min   Charges:   PT Evaluation $PT Eval Moderate Complexity: 1 Mod PT Treatments $Gait Training: 8-22 mins   PT G Codes:   PT G-Codes **NOT FOR INPATIENT CLASS** Functional Assessment Tool Used: AM-PAC 6 Clicks Basic Mobility;Clinical judgement Functional Limitation: Mobility: Walking and moving around Mobility: Walking and Moving Around Current Status (O0321): At least 40 percent but less than 60 percent impaired, limited or restricted Mobility: Walking and Moving Around Goal Status [phone]): At least 20 percent but less than 40 percent impaired, limited or restricted    Benjamine Mola B. Migdalia Dk PT, DPT Acute Rehabilitation  [phone] Pager [phone]    [place] 12/21/2016, 12:51 PM

## 2016-12-24 ENCOUNTER — Encounter: Payer: Self-pay | Admitting: Internal Medicine

## 2016-12-24 ENCOUNTER — Ambulatory Visit: Payer: Medicare Other | Admitting: Internal Medicine

## 2016-12-24 VITALS — BP 108/64 | HR 101 | Temp 97.3°F | Wt 220.7 lb

## 2016-12-24 DIAGNOSIS — Z09 Encounter for follow-up examination after completed treatment for conditions other than malignant neoplasm: Secondary | ICD-10-CM | POA: Diagnosis not present

## 2016-12-24 DIAGNOSIS — F0390 Unspecified dementia without behavioral disturbance: Secondary | ICD-10-CM

## 2016-12-24 DIAGNOSIS — I10 Essential (primary) hypertension: Secondary | ICD-10-CM

## 2016-12-24 DIAGNOSIS — Z87891 Personal history of nicotine dependence: Secondary | ICD-10-CM

## 2016-12-24 DIAGNOSIS — Z8661 Personal history of infections of the central nervous system: Secondary | ICD-10-CM | POA: Diagnosis not present

## 2016-12-24 DIAGNOSIS — Z79899 Other long term (current) drug therapy: Secondary | ICD-10-CM

## 2016-12-24 DIAGNOSIS — R410 Disorientation, unspecified: Secondary | ICD-10-CM

## 2016-12-24 NOTE — Progress Notes (Signed)
Medicine attending: I personally interviewed and briefly examined this patient on the day of the patient visit and reviewed pertinent clinical ,laboratory data  with resident physician Dr. Gwenette Greet and we discussed a management plan. Stable post recent hospital admission for altered mental status. Likely dementing illness. May have also had component of hypotension from meds. Neurology eval in progress. BP remians low. We will not resume meds held during admission. Wife here. Status eviewed.

## 2016-12-24 NOTE — Patient Instructions (Addendum)
Thank you for allowing [place] to care for you.  For your Confusion: - This is believed to be due to underlying demetia - Continue Donepezil 5mg  Daily - Follow up with Neurology on 01/16/17 - CPAP for your sleep apnea will improve your sleep and may help with confusion as well  For your Hypertension: - Your blood pressure today is on the low side - We will continue the Torsemide and Metoprolol - We will hold of on restarting the isosorbide mononitrate and ramipril.   Please follow up with your PCP in about 1 months (followiung your neurology appointment. Or earlier if your experience problems or concerning symptoms.

## 2016-12-24 NOTE — Assessment & Plan Note (Signed)
Patient recently hopsitalized 12/20/16 - 12/21/16 for acute encephalopathy believed to be secondary to dementia with episodic worsening. His MOCA during previous admission in October was 15, suggesting moderate to severe dementia. Patients labs were reassuring during his admission and his acute symptoms resolved overnight. He was started on Donepezil 5mg  pror to discharge.  Today patient is feeling well and is A&Ox2 (person, place, month (not year or president). He knows who his wife is. Patients wife expressed frustration over how long it they have been waiting to get to his neurology appointment (which is scheduled for 01/19/17). She also was concerned that his torsemide was contributing to his confusion as she noticed he confusion increase when the dose was increased previously. The patient and his wife deny symptoms of dehydration and labs at recent admission showed stable renal function with Cr. 1.3.  His wife states he is to receive a CPAP device next Tuesday. Poor sleep likely contributing to episodic worsening of his dementia. - Continue Donepezil 5mg  Daily - Follow up with Neurology on 01/16/17 - CPAP for sleep apnea - F/U with PCP in 1 month

## 2016-12-24 NOTE — Assessment & Plan Note (Signed)
For the patient's HTN. He was Normotensive while admitted and Iso-Mono and Ramipril were held at discharge. BP Today remains low-normal at 108/64. - Continue Torsemide 20mg , Daily - Continue Metoprolol 25mg  Daily - Defer Restarting Isosorbide-mononitrate and Ramipril. - Follow Up in 1 month with PCP, or earlier if Hypertensive

## 2016-12-24 NOTE — Progress Notes (Signed)
   CC: Confusion, HTN, Hospital F/U  HPI:  [name] is a 76 y.o. M with PMHx listed below presenting for Confusion, HTN, Hospital F/U. Please see the A&P for the status of the patient's chronic medical problems.  Patient recently hopsitalized 12/20/16 - 12/21/16 for acute encephalopathy believed to be secondary to dementia with episodic worsening. His MOCA during previous admission in October was 15, suggesting moderate to severe dementia. Patients labs were reassuring during his admission and his acute symptoms resolved overnight. He was started on Donepezil 5mg  pror to discharge.  Patients wife expressed frustration over how long it they have been waiting to get to his neurology appointment (which is scheduled for 01/19/17). She also was concerned that his torsemide was contributing to his confusion as she noticed he confusion increase when the dose was increased previously. The patient and his wife deny symptoms of dehydration and labs at recent admission showed stable renal function with Cr. 1.3.  His wife states he is to receive a CPAP device next Tuesday.   Past Medical History:  Diagnosis Date  . Anxiety   . Arthritis    "knees, back, shoulders" (11/26/2016)  . CAD (coronary artery disease)    a. remote stenting >20 years ago. b. PTCA unknown vessel ~2013. c. 10/2015: s/p rotational atherectomy/DES to distal RCA and RPDA c/b embolic CVA  . CHB (complete heart block) ([place]) 02/2015   [name] 03/03/2015  . Chronic diastolic CHF (congestive heart failure) ([place])   . Chronic shoulder pain    "both shoulders" (11/26/2016)  . [name] (chronic kidney disease), stage III ([place])   . Confusion 12/2016  . COPD (chronic obstructive pulmonary disease) ([place])   . Depression   . Diabetes mellitus without complication ([place])    TYPE 2  . Dilated aortic root ([place])    a. mild by echo 09/2015.  Marland Kitchen GERD (gastroesophageal reflux disease)   . Headache    "weekly" (11/26/2016)  . Heart attack [facility])  'many years ago'  . High cholesterol   . Hypertension   . Hypothyroidism   . OSA (obstructive sleep apnea)    "suppose to have a mask; they haven't got one adjusted for him yet" (11/26/2016)  . Presence of permanent cardiac pacemaker   . 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 Skin cancer of face    "had it cut off"  . Statin intolerance   . Stroke [facility]) 10/2015   "mini stroke; been having memory issues & confusion ever since" (11/26/2016)  . Symptomatic bradycardia    a. syncope/sinus arrest and bradycardia s/p St. Jude PPM 02/2015 with lead revision 03/2015.   Review of Systems: Performed and negative except as otherwise indicated.  Physical Exam:  Vitals:   12/24/16 1100  BP: 108/64  Pulse: (!) 101  Temp: (!) 97.3 F (36.3 C)  TempSrc: Oral  SpO2: 100%  Weight: 220 lb 11.2 oz (100.1 kg)  Physical Exam  Constitutional: He appears well-developed and well-nourished.  Cardiovascular: Normal rate, regular rhythm and normal heart sounds.  Pulmonary/Chest: Effort normal and breath sounds normal.  Abdominal: Soft. Bowel sounds are normal. He exhibits no distension. There is no tenderness.  Neurological: He is alert.  Oriented to person, place, and month but not year or president     Assessment & Plan:   See Encounters Tab for problem based charting.  Patient seen with Dr. Beryle Beams

## 2016-12-28 ENCOUNTER — Ambulatory Visit (INDEPENDENT_AMBULATORY_CARE_PROVIDER_SITE_OTHER): Payer: Medicare Other | Admitting: *Deleted

## 2016-12-28 DIAGNOSIS — R001 Bradycardia, unspecified: Secondary | ICD-10-CM

## 2016-12-28 NOTE — Progress Notes (Signed)
Remote pacemaker transmission.   

## 2016-12-29 ENCOUNTER — Encounter: Payer: Self-pay | Admitting: Cardiology

## 2016-12-31 ENCOUNTER — Other Ambulatory Visit: Payer: Self-pay | Admitting: *Deleted

## 2016-12-31 DIAGNOSIS — E119 Type 2 diabetes mellitus without complications: Secondary | ICD-10-CM

## 2016-12-31 LAB — CUP PACEART REMOTE DEVICE CHECK
Battery Remaining Percentage: 95.5 %
Brady Statistic AP VP Percent: 1 %
Brady Statistic AP VS Percent: 37 %
Brady Statistic AS VS Percent: 61 %
Brady Statistic RV Percent Paced: 1.8 %
Implantable Lead Implant Date: 20170221
Implantable Lead Location: 753860
Lead Channel Impedance Value: 430 Ohm
Lead Channel Pacing Threshold Amplitude: 1 V
Lead Channel Pacing Threshold Pulse Width: 0.5 ms
Lead Channel Sensing Intrinsic Amplitude: 4.8 mV
Lead Channel Setting Pacing Amplitude: 2.5 V
Lead Channel Setting Pacing Pulse Width: 0.5 ms
Lead Channel Setting Sensing Sensitivity: 0.7 mV
MDC IDC LEAD IMPLANT DT: 20170221
MDC IDC LEAD LOCATION: 753859
MDC IDC MSMT BATTERY REMAINING LONGEVITY: 116 mo
MDC IDC MSMT BATTERY VOLTAGE: 3.01 V
MDC IDC MSMT LEADCHNL RA IMPEDANCE VALUE: 430 Ohm
MDC IDC MSMT LEADCHNL RA PACING THRESHOLD AMPLITUDE: 0.5 V
MDC IDC MSMT LEADCHNL RA PACING THRESHOLD PULSEWIDTH: 0.5 ms
MDC IDC MSMT LEADCHNL RV SENSING INTR AMPL: 12 mV
MDC IDC PG IMPLANT DT: 20170221
MDC IDC PG SERIAL: 7877392
MDC IDC SESS DTM: 20181218070034
MDC IDC SET LEADCHNL RA PACING AMPLITUDE: 2 V
MDC IDC STAT BRADY AS VP PERCENT: 1.2 %
MDC IDC STAT BRADY RA PERCENT PACED: 36 %

## 2016-12-31 MED ORDER — GLIPIZIDE 5 MG PO TABS: 5.0000 mg | ORAL_TABLET | Freq: Every day | ORAL | 0 refills | Status: DC

## 2016-12-31 NOTE — Telephone Encounter (Signed)
Next appt scheduled  01/21/17 with PCP.

## 2017-01-10 ENCOUNTER — Ambulatory Visit: Payer: Medicare Other | Admitting: Interventional Cardiology

## 2017-01-15 ENCOUNTER — Telehealth: Payer: Self-pay | Admitting: Internal Medicine

## 2017-01-15 ENCOUNTER — Encounter: Payer: Self-pay | Admitting: Internal Medicine

## 2017-01-15 NOTE — Telephone Encounter (Signed)
   Reason for call:   I received a call from [name] at 1140 PM indicating her husband has been acting increasingly confused and listless for the past 48 hours.   Pertinent Data:   He has also become unsteady and stumbling in the house today and could not feed himself at dinner.  She reports he has had a runny nose but has not noticed significant coughing. No reported N/V/D.  He has been seen last month at the hospital with similar symptoms that were attributed to dementia without other secondary causes identified. Prior to that was another admission due to orthostatic hypotension from overmedication.   Assessment / Plan / Recommendations:   I recommended that Mr. Trim be brought to the ED for evaluation. It is possible his symptoms are just due to degenerative neuropsychiatric disease but these could also be related to secondary causes. It would be less safe to delay any evaluation until during the week.   Collier Salina, MD   01/16/2017, 12:01 AM

## 2017-01-16 ENCOUNTER — Observation Stay (HOSPITAL_COMMUNITY)
Admission: EM | Admit: 2017-01-16 | Discharge: 2017-01-16 | Disposition: A | Discharge status: Home / Self Care | Payer: Medicare HMO | Attending: Internal Medicine | Admitting: Internal Medicine

## 2017-01-16 ENCOUNTER — Other Ambulatory Visit: Payer: Self-pay

## 2017-01-16 ENCOUNTER — Emergency Department (HOSPITAL_COMMUNITY): Payer: Medicare HMO

## 2017-01-16 ENCOUNTER — Encounter (HOSPITAL_COMMUNITY): Payer: Self-pay | Admitting: Emergency Medicine

## 2017-01-16 DIAGNOSIS — J449 Chronic obstructive pulmonary disease, unspecified: Secondary | ICD-10-CM

## 2017-01-16 DIAGNOSIS — Z85828 Personal history of other malignant neoplasm of skin: Secondary | ICD-10-CM | POA: Diagnosis not present

## 2017-01-16 DIAGNOSIS — Z7902 Long term (current) use of antithrombotics/antiplatelets: Secondary | ICD-10-CM

## 2017-01-16 DIAGNOSIS — G9340 Encephalopathy, unspecified: Secondary | ICD-10-CM | POA: Diagnosis not present

## 2017-01-16 DIAGNOSIS — R410 Disorientation, unspecified: Secondary | ICD-10-CM | POA: Diagnosis not present

## 2017-01-16 DIAGNOSIS — Z7951 Long term (current) use of inhaled steroids: Secondary | ICD-10-CM

## 2017-01-16 DIAGNOSIS — E1122 Type 2 diabetes mellitus with diabetic chronic kidney disease: Secondary | ICD-10-CM

## 2017-01-16 DIAGNOSIS — E119 Type 2 diabetes mellitus without complications: Secondary | ICD-10-CM

## 2017-01-16 DIAGNOSIS — I252 Old myocardial infarction: Secondary | ICD-10-CM | POA: Diagnosis not present

## 2017-01-16 DIAGNOSIS — F05 Delirium due to known physiological condition: Secondary | ICD-10-CM

## 2017-01-16 DIAGNOSIS — Z886 Allergy status to analgesic agent status: Secondary | ICD-10-CM | POA: Diagnosis not present

## 2017-01-16 DIAGNOSIS — D649 Anemia, unspecified: Secondary | ICD-10-CM | POA: Diagnosis not present

## 2017-01-16 DIAGNOSIS — F329 Major depressive disorder, single episode, unspecified: Secondary | ICD-10-CM | POA: Insufficient documentation

## 2017-01-16 DIAGNOSIS — Z888 Allergy status to other drugs, medicaments and biological substances status: Secondary | ICD-10-CM | POA: Diagnosis not present

## 2017-01-16 DIAGNOSIS — Z9183 Wandering in diseases classified elsewhere: Secondary | ICD-10-CM

## 2017-01-16 DIAGNOSIS — E039 Hypothyroidism, unspecified: Secondary | ICD-10-CM | POA: Insufficient documentation

## 2017-01-16 DIAGNOSIS — Z7984 Long term (current) use of oral hypoglycemic drugs: Secondary | ICD-10-CM

## 2017-01-16 DIAGNOSIS — Z8249 Family history of ischemic heart disease and other diseases of the circulatory system: Secondary | ICD-10-CM

## 2017-01-16 DIAGNOSIS — G4733 Obstructive sleep apnea (adult) (pediatric): Secondary | ICD-10-CM | POA: Diagnosis not present

## 2017-01-16 DIAGNOSIS — I130 Hypertensive heart and chronic kidney disease with heart failure and stage 1 through stage 4 chronic kidney disease, or unspecified chronic kidney disease: Secondary | ICD-10-CM

## 2017-01-16 DIAGNOSIS — N183 Chronic kidney disease, stage 3 (moderate): Secondary | ICD-10-CM

## 2017-01-16 DIAGNOSIS — Z7982 Long term (current) use of aspirin: Secondary | ICD-10-CM | POA: Insufficient documentation

## 2017-01-16 DIAGNOSIS — F0390 Unspecified dementia without behavioral disturbance: Secondary | ICD-10-CM | POA: Diagnosis not present

## 2017-01-16 DIAGNOSIS — N289 Disorder of kidney and ureter, unspecified: Secondary | ICD-10-CM

## 2017-01-16 DIAGNOSIS — F419 Anxiety disorder, unspecified: Secondary | ICD-10-CM | POA: Insufficient documentation

## 2017-01-16 DIAGNOSIS — K219 Gastro-esophageal reflux disease without esophagitis: Secondary | ICD-10-CM | POA: Diagnosis not present

## 2017-01-16 DIAGNOSIS — Z8661 Personal history of infections of the central nervous system: Secondary | ICD-10-CM

## 2017-01-16 DIAGNOSIS — R4182 Altered mental status, unspecified: Secondary | ICD-10-CM

## 2017-01-16 DIAGNOSIS — Z955 Presence of coronary angioplasty implant and graft: Secondary | ICD-10-CM

## 2017-01-16 DIAGNOSIS — Z833 Family history of diabetes mellitus: Secondary | ICD-10-CM

## 2017-01-16 DIAGNOSIS — Z8673 Personal history of transient ischemic attack (TIA), and cerebral infarction without residual deficits: Secondary | ICD-10-CM

## 2017-01-16 DIAGNOSIS — F0391 Unspecified dementia with behavioral disturbance: Secondary | ICD-10-CM

## 2017-01-16 DIAGNOSIS — Z884 Allergy status to anesthetic agent status: Secondary | ICD-10-CM

## 2017-01-16 DIAGNOSIS — I2510 Atherosclerotic heart disease of native coronary artery without angina pectoris: Secondary | ICD-10-CM

## 2017-01-16 DIAGNOSIS — I5032 Chronic diastolic (congestive) heart failure: Secondary | ICD-10-CM

## 2017-01-16 DIAGNOSIS — F0280 Dementia in other diseases classified elsewhere without behavioral disturbance: Secondary | ICD-10-CM | POA: Diagnosis present

## 2017-01-16 DIAGNOSIS — G8929 Other chronic pain: Secondary | ICD-10-CM | POA: Insufficient documentation

## 2017-01-16 DIAGNOSIS — I10 Essential (primary) hypertension: Secondary | ICD-10-CM | POA: Diagnosis present

## 2017-01-16 DIAGNOSIS — Z950 Presence of cardiac pacemaker: Secondary | ICD-10-CM | POA: Diagnosis not present

## 2017-01-16 DIAGNOSIS — G300 Alzheimer's disease with early onset: Secondary | ICD-10-CM

## 2017-01-16 DIAGNOSIS — Z79899 Other long term (current) drug therapy: Secondary | ICD-10-CM

## 2017-01-16 DIAGNOSIS — R0602 Shortness of breath: Secondary | ICD-10-CM | POA: Diagnosis not present

## 2017-01-16 LAB — I-STAT ARTERIAL BLOOD GAS, ED
BICARBONATE: 24.3 mmol/L (ref 20.0–28.0)
O2 Saturation: 95 %
PCO2 ART: 37.4 mmHg (ref 32.0–48.0)
Patient temperature: 97.8
TCO2: 25 mmol/L (ref 22–32)
pH, Arterial: 7.418 (ref 7.350–7.450)
pO2, Arterial: 71 mmHg — ABNORMAL LOW (ref 83.0–108.0)

## 2017-01-16 LAB — URINALYSIS, ROUTINE W REFLEX MICROSCOPIC
Bilirubin Urine: NEGATIVE
GLUCOSE, UA: NEGATIVE mg/dL
Hgb urine dipstick: NEGATIVE
Ketones, ur: NEGATIVE mg/dL
LEUKOCYTES UA: NEGATIVE
Nitrite: NEGATIVE
PH: 5 (ref 5.0–8.0)
Protein, ur: NEGATIVE mg/dL
Specific Gravity, Urine: 1.019 (ref 1.005–1.030)

## 2017-01-16 LAB — HEPATIC FUNCTION PANEL
ALT: 30 U/L (ref 17–63)
AST: 41 U/L (ref 15–41)
Albumin: 3.5 g/dL (ref 3.5–5.0)
Alkaline Phosphatase: 126 U/L (ref 38–126)
BILIRUBIN TOTAL: 0.8 mg/dL (ref 0.3–1.2)
Total Protein: 6.8 g/dL (ref 6.5–8.1)

## 2017-01-16 LAB — CBC
HEMATOCRIT: 30.7 % — AB (ref 39.0–52.0)
HEMOGLOBIN: 9.2 g/dL — AB (ref 13.0–17.0)
MCH: 24.3 pg — AB (ref 26.0–34.0)
MCHC: 30 g/dL (ref 30.0–36.0)
MCV: 81 fL (ref 78.0–100.0)
Platelets: 262 10*3/uL (ref 150–400)
RBC: 3.79 MIL/uL — ABNORMAL LOW (ref 4.22–5.81)
RDW: 17 % — ABNORMAL HIGH (ref 11.5–15.5)
WBC: 7.8 10*3/uL (ref 4.0–10.5)

## 2017-01-16 LAB — I-STAT TROPONIN, ED: Troponin i, poc: 0 ng/mL (ref 0.00–0.08)

## 2017-01-16 LAB — BASIC METABOLIC PANEL
ANION GAP: 8 (ref 5–15)
BUN: 14 mg/dL (ref 6–20)
CALCIUM: 9.3 mg/dL (ref 8.9–10.3)
CO2: 27 mmol/L (ref 22–32)
Chloride: 101 mmol/L (ref 101–111)
Creatinine, Ser: 1.42 mg/dL — ABNORMAL HIGH (ref 0.61–1.24)
GFR calc Af Amer: 53 mL/min — ABNORMAL LOW (ref >60)
GFR calc non Af Amer: 46 mL/min — ABNORMAL LOW (ref >60)
Glucose, Bld: 145 mg/dL — ABNORMAL HIGH (ref 65–99)
POTASSIUM: 3.9 mmol/L (ref 3.5–5.1)
Sodium: 136 mmol/L (ref 135–145)

## 2017-01-16 LAB — AMMONIA: Ammonia: 14 umol/L (ref 9–35)

## 2017-01-16 MED ORDER — CLOPIDOGREL BISULFATE 75 MG PO TABS
75.0000 mg | ORAL_TABLET | Freq: Every day | ORAL | Status: DC
  Administered 2017-01-16: 75 mg via ORAL
  Filled 2017-01-16: qty 1

## 2017-01-16 MED ORDER — UMECLIDINIUM-VILANTEROL 62.5-25 MCG/INH IN AEPB
1.0000 | INHALATION_SPRAY | Freq: Every day | RESPIRATORY_TRACT | Status: DC
  Administered 2017-01-16: 09:00:00 1 via RESPIRATORY_TRACT
  Filled 2017-01-16 (×2): qty 14

## 2017-01-16 MED ORDER — LEVOTHYROXINE SODIUM 25 MCG PO TABS
25.0000 ug | ORAL_TABLET | Freq: Every day | ORAL | Status: DC
  Filled 2017-01-16: qty 1

## 2017-01-16 MED ORDER — GLIPIZIDE 5 MG PO TABS
5.0000 mg | ORAL_TABLET | Freq: Every day | ORAL | Status: DC
  Filled 2017-01-16: qty 1

## 2017-01-16 MED ORDER — NITROGLYCERIN 0.4 MG SL SUBL
0.4000 mg | SUBLINGUAL_TABLET | Freq: Every day | SUBLINGUAL | Status: DC | PRN
Start: 2017-01-16 — End: 2017-01-16

## 2017-01-16 MED ORDER — ACETAMINOPHEN 500 MG PO TABS: 500.0000 mg | ORAL_TABLET | Freq: Four times a day (QID) | ORAL | Status: DC | PRN

## 2017-01-16 MED ORDER — DONEPEZIL HCL 5 MG PO TABS: 5.0000 mg | ORAL_TABLET | Freq: Every day | ORAL | Status: DC

## 2017-01-16 MED ORDER — ADULT MULTIVITAMIN W/MINERALS CH
1.0000 | ORAL_TABLET | Freq: Every day | ORAL | Status: DC
  Administered 2017-01-16: 1 via ORAL
  Filled 2017-01-16: qty 1

## 2017-01-16 MED ORDER — ASPIRIN EC 81 MG PO TBEC
81.0000 mg | DELAYED_RELEASE_TABLET | Freq: Every day | ORAL | Status: DC
  Administered 2017-01-16: 81 mg via ORAL
  Filled 2017-01-16: qty 1

## 2017-01-16 MED ORDER — METOPROLOL SUCCINATE ER 25 MG PO TB24
50.0000 mg | ORAL_TABLET | Freq: Every day | ORAL | Status: DC
  Filled 2017-01-16: qty 2
  Filled 2017-01-16: qty 1

## 2017-01-16 MED ORDER — SODIUM CHLORIDE 0.9 % IV BOLUS (SEPSIS)
500.0000 mL | Freq: Once | INTRAVENOUS | Status: AC
  Administered 2017-01-16: 500 mL via INTRAVENOUS

## 2017-01-16 MED ORDER — VITAMIN B-12 1000 MCG PO TABS: 1000.0000 ug | ORAL_TABLET | Freq: Every day | ORAL | Status: DC

## 2017-01-16 MED ORDER — ESCITALOPRAM OXALATE 10 MG PO TABS: 10.0000 mg | ORAL_TABLET | Freq: Every day | ORAL | Status: DC

## 2017-01-16 MED ORDER — ENOXAPARIN SODIUM 40 MG/0.4ML ~~LOC~~ SOLN
40.0000 mg | Freq: Every day | SUBCUTANEOUS | Status: DC
  Filled 2017-01-16: qty 0.4

## 2017-01-16 NOTE — Discharge Summary (Signed)
Name: [name] MRN: [id number] DOB: 08/31/1940 77 y.o. PCP: Brandon Mage, MD  Date of Admission: 01/16/2017  1:38 AM Date of Discharge: 01/17/16 12:25 pm Attending Physician: Brandon Groves, DO  Discharge Diagnosis: 1. Delirium complicating dementia  Principal Problem:   Encephalopathy Active Problems:   Essential hypertension   Dementia   OSA (obstructive sleep apnea)   Type 2 diabetes mellitus without complication, without long-term current use of insulin (HCC)   Confusion   Discharge Medications: Allergies as of 01/16/2017      Reactions   Novocain [procaine] Other (See Comments)   Hallucinations   Rosuvastatin Calcium Other (See Comments)   Whole body ache/pain   Statins Other (See Comments)   Whole body ache/pain      Medication List    STOP taking these medications   donepezil 5 MG tablet Commonly known as:  ARICEPT     TAKE these medications   acetaminophen 500 MG tablet Commonly known as:  TYLENOL Take 500 mg by mouth every 6 (six) hours as needed for headache.   aspirin EC 81 MG tablet Take 81 mg by mouth daily.   clopidogrel 75 MG tablet Commonly known as:  PLAVIX Take 1 tablet (75 mg total) by mouth daily.   escitalopram 10 MG tablet Commonly known as:  LEXAPRO Take 1 tablet (10 mg total) at bedtime by mouth.   glipiZIDE 5 MG tablet Commonly known as:  GLUCOTROL Take 1 tablet (5 mg total) by mouth daily before breakfast.   Glycopyrrolate-Formoterol 9-4.8 MCG/ACT Aero Commonly known as:  BEVESPI AEROSPHERE Inhale 2 puffs into the lungs 2 (two) times daily.   levothyroxine 25 MCG tablet Commonly known as:  SYNTHROID, LEVOTHROID Take 25 mcg by mouth daily before breakfast.   metoprolol succinate 25 MG 24 hr tablet Commonly known as:  TOPROL-XL Take 2 tablets (50 mg total) by mouth daily. Take with or immediately following a meal.   multivitamin with minerals Tabs tablet Take 1 tablet by mouth daily.   nitroGLYCERIN 0.4 MG SL  tablet Commonly known as:  NITROSTAT Place 0.4 mg under the tongue daily as needed for chest pain.   PROAIR HFA 108 (90 Base) MCG/ACT inhaler Generic drug:  albuterol Inhale 1-2 puffs into the lungs every 4 (four) hours as needed for shortness of breath.   ranitidine 150 MG tablet Commonly known as:  ZANTAC Take 1 tablet (150 mg total) by mouth 2 (two) times daily.   Spacer/Aero Chamber Mouthpiece Misc 1 Device by Does not apply route as directed. What changed:    when to take this  additional instructions   torsemide 20 MG tablet Commonly known as:  DEMADEX Take 20 mg by mouth daily.   VITAMIN B-12 PO Place 1 mL under the tongue at bedtime.       Disposition and follow-up:   [name] was discharged from [facility] in stable condition.  At the hospital follow up visit please address:  1.  Dementia, sundowning: Please assess current symptoms and reassure patients wife if they sound consistent with dementia and sundowning. Please ensure Aricept was discontinued. You could consider starting Exelon patch or Namenda at the follow-up visit.  2.  Labs / imaging needed at time of follow-up: none  3.  Pending labs/ test needing follow-up: none  Hospital Course by problem list: Principal Problem:   Encephalopathy Active Problems:   Essential hypertension   Dementia   OSA (obstructive sleep apnea)   Type 2 diabetes  mellitus without complication, without long-term current use of insulin (HCC)   Confusion   1. Delirium complicating Dementia Pleasantly demented 77 year old male brought to the emergency department by his wife due to concerns of worsening confusion and agitation at night for several days and some concern for increased fatigue. He is recently admitted for a similar presentation which was thought to be due to dementia and he was discharged home on low-dose donepezil. Unfortunately, since that time his wife notes his mental status has been  worsening. Infectious workup performed on admission was negative and symptoms seem most consistent with sundowning. This was discussed with patient's wife, who has experience with demented patients. She agreed that his symptoms are most likely due to sundowning however has been having difficulty watching her husband go through this process. She was provided with support group information for spouses of those with dementia. They were instructed to stop donepezil on discharge. Could consider Exelon patch or Namenda at their follow-up PCP visit which has been arranged for the following week.  Discharge Vitals:   BP 135/79 (BP Location: Right Arm)   Pulse 61   Temp 97.6 F (36.4 C) (Oral)   Resp 18   SpO2 99%   Pertinent Labs, Studies, and Procedures:  UA: Noninfectious Chest x-ray: No consolidation or edema CT head: No acute intracranial process  Discharge Instructions: [name], you were admitted due to worsening confusion and fatigue in the evenings. Your presentation is most consistent with sundowning which is not uncommon in those with dementia. We are going to stop one of your medications which seems to be making your symptoms worse. You already have a follow-up appointment with your primary care appointment and neurology this week.   SignedEinar Gip, DO 01/16/2017, 12:16 PM   Pager: [phone]

## 2017-01-16 NOTE — ED Notes (Signed)
Received discharge paper work for patient however 2 family members are now not in room for discharge.

## 2017-01-16 NOTE — ED Notes (Signed)
Wife returned to bedside

## 2017-01-16 NOTE — Discharge Instructions (Signed)
[name], you were admitted due to confusion at night. This is likely from sundowning. This is common in patients with dementia. Unfortunately it is difficult to manage, and is even more difficult to manage in the hospital.  Please STOP taking Aricept (Donapezil).  You already have a follow-up appointment scheduled with your primary care physician this week.   Here is some information on support groups for spouses of those with dementia:  [email or web address]/fact-sheets

## 2017-01-16 NOTE — ED Notes (Signed)
Family arrived at bedside.

## 2017-01-16 NOTE — ED Notes (Signed)
Food tray given to patient 

## 2017-01-16 NOTE — ED Notes (Signed)
Wife verbalized understanding of discharge instructions.

## 2017-01-16 NOTE — ED Provider Notes (Signed)
[place] EMERGENCY DEPARTMENT Provider Note   CSN: [id number] Arrival date & time: 01/16/17  0031     History   Chief Complaint Chief Complaint  Patient presents with  . Shortness of Breath    HPI [name] is a 77 y.o. male.  The history is provided by the patient, a relative and the spouse. The history is limited by the condition of the patient (Confusion).  Shortness of Breath   He has been having progressive problems with confusion for the last week.  Initially, confusion was mainly at night.  Yesterday he had some episodes of confusion during the day.  He has been confused all day today.  It got to the point where he could not remember how to go up steps and was not able to feed himself.  Wife relates that blood pressure was elevated at home and heart rate was "all over the place", from a low of 34 to a high of over 100.  He has not had any fever or chills.  There is been no cough.  There is been no vomiting or diarrhea.  He was hospitalized recently for confusion and is scheduled to see a neurologist next week.  Family relates that he was doing well for about 2 weeks following discharge, and then started getting worse.  He had been started on donepezil while in the hospital.  No other recent medication changes.  Also, of note, he had been prescribed a CPAP machine for sleep apnea, but he has been very resistant about using it.  Past Medical History:  Diagnosis Date  . Anxiety   . Arthritis    "knees, back, shoulders" (11/26/2016)  . CAD (coronary artery disease)    a. remote stenting >20 years ago. b. PTCA unknown vessel ~2013. c. 10/2015: s/p rotational atherectomy/DES to distal RCA and RPDA c/b embolic CVA  . CHB (complete heart block) ([place]) 02/2015   [name] 03/03/2015  . Chronic diastolic CHF (congestive heart failure) ([place])   . Chronic shoulder pain    "both shoulders" (11/26/2016)  . [name] (chronic kidney disease), stage III ([place])   . Confusion  12/2016  . COPD (chronic obstructive pulmonary disease) ([place])   . Depression   . Diabetes mellitus without complication ([place])    TYPE 2  . Dilated aortic root ([place])    a. mild by echo 09/2015.  Marland Kitchen GERD (gastroesophageal reflux disease)   . Headache    "weekly" (11/26/2016)  . Heart attack [facility]) 'many years ago'  . High cholesterol   . Hypertension   . Hypothyroidism   . OSA (obstructive sleep apnea)    "suppose to have a mask; they haven't got one adjusted for him yet" (11/26/2016)  . Presence of permanent cardiac pacemaker   . 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 Skin cancer of face    "had it cut off"  . Statin intolerance   . Stroke [facility]) 10/2015   "mini stroke; been having memory issues & confusion ever since" (11/26/2016)  . Symptomatic bradycardia    a. syncope/sinus arrest and bradycardia s/p St. Jude PPM 02/2015 with lead revision 03/2015.    Patient Active Problem List   Diagnosis Date Noted  . Confusion   . Encephalopathy acute 12/20/2016  . Orthostatic hypotension 12/13/2016  . Type 2 diabetes mellitus without complication, without long-term current use of insulin ([place]) 11/29/2016  . SVT (supraventricular tachycardia) ([place]) 11/26/2016  . Alpha-1-antitrypsin deficiency carrier ([place]) 11/25/2016  . Restrictive lung disease 11/08/2016  .  COPD (chronic obstructive pulmonary disease) ([place]) 10/25/2016  . Shortness of breath 10/12/2016  . Anemia, macrocytic 12/24/2015  . Frail elderly 11/14/2015  . History of heart artery stent 11/14/2015  . [name] stage G3a/A1, GFR 45-59 and albumin creatinine ratio <30 mg/g ([place]) 11/05/2015  . Facial weakness   . S/P PTCA (percutaneous transluminal coronary angioplasty)   . Hyperlipidemia   . Essential hypertension   . S/P [facility] Feb 2017 10/23/2015  . CAD-S/P remote PCI 20 yrs ago, ? 4 years ago, and s/p RCA PCI/DES 10/22/15 10/23/2015  . Cerebral thrombosis with cerebral infarction 10/23/2015  . DOE (dyspnea on exertion)   . Accelerating  angina ([place])   . Hypothyroidism 07/09/2015  . Fatigue due to depression 05/09/2015  . Moderate single current episode of major depressive disorder ([place]) 05/09/2015  . 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 Syncope 04/10/2015  . Faintness   . Retinal detachment, tractional, right eye 03/15/2015  . Hepatic cyst 03/11/2015  . Pacemaker 03/11/2015  . Contracture of ankle and foot joint 03/10/2015  . Drug therapy 03/10/2015  . Dysphagia 03/10/2015  . Edema 03/10/2015  . History of ST elevation myocardial infarction (STEMI) 03/10/2015  . Low back pain 03/10/2015  . Mild cognitive impairment 03/10/2015  . Mediastinal lymphadenopathy 03/10/2015  . Obesity 03/10/2015  . Osteoarthritis 03/10/2015  . 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 Personal history of noncompliance with medical treatment, presenting hazards to health 03/10/2015  . 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 Symptomatic bradycardia 03/04/2015  . History of complete heart block 03/03/2015  . Complete atrioventricular block ([place]) 03/03/2015  . Abnormal chest x-ray 02/19/2015  . Abnormal TSH 02/19/2015  . Anxiety about health 02/19/2015  . CAD in native artery 02/19/2015  . Diastolic CHF ([place]) 82/50/5397  . 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 LVH (left ventricular hypertrophy) 02/19/2015  . Medical non-compliance 02/19/2015  . OSA (obstructive sleep apnea) 02/19/2015  . Prediabetes 02/19/2015  . Rotator cuff tear, right 02/19/2015  . Abnormal computed tomography scan 02/10/2015  . Abnormal CT of the chest 02/10/2015  . Coronary artery disease involving native coronary artery of native heart 01/23/2015  . Non-Q wave myocardial infarction ([place]) 09/09/2011    Past Surgical History:  Procedure Laterality Date  . CARDIAC CATHETERIZATION N/A 10/17/2015   Procedure: Left Heart Cath and Coronary Angiography;  Surgeon: Jettie Booze, MD;  Location: [place] CV LAB;  Service:  Cardiovascular;  Laterality: N/A;  . CARDIAC CATHETERIZATION N/A 10/22/2015   Procedure: Coronary Stent Intervention Rotablater;  Surgeon: Jettie Booze, MD;  Location: [place] CV LAB;  Service: Cardiovascular;  Laterality: N/A;  . CARDIAC CATHETERIZATION N/A 10/22/2015   Procedure: Left Heart Cath and Coronary Angiography;  Surgeon: Jettie Booze, MD;  Location: [place] CV LAB;  Service: Cardiovascular;  Laterality: N/A;  . CATARACT EXTRACTION W/ INTRAOCULAR LENS  IMPLANT, BILATERAL Bilateral 2016  . CORONARY ANGIOPLASTY  2012  . CORONARY ANGIOPLASTY WITH STENT PLACEMENT  2001  . CORONARY ANGIOPLASTY WITH STENT PLACEMENT     "took 2 out and put 1 longer one in"  . EP IMPLANTABLE DEVICE N/A 03/04/2015   Procedure: Pacemaker Implant;  Surgeon: Will Meredith Leeds, MD;  Location: [place] CV LAB;  Service: Cardiovascular;  Laterality: N/A;  . EP IMPLANTABLE DEVICE N/A 04/10/2015   Procedure: PPM Lead Revision/Repair;  Surgeon: Will Meredith Leeds, MD;  Location: [place] CV LAB;  Service: Cardiovascular;  Laterality: N/A;  . EYE SURGERY    .  INSERT / REPLACE / REMOVE PACEMAKER    . RETINAL DETACHMENT SURGERY Bilateral    "several on each side"       Home Medications    Prior to Admission medications   Medication Sig Start Date End Date Taking? Authorizing Provider  acetaminophen (TYLENOL) 500 MG tablet Take 500 mg by mouth every 6 (six) hours as needed for headache.    [provider]  albuterol (PROAIR HFA) 108 (90 Base) MCG/ACT inhaler Inhale 1-2 puffs into the lungs every 4 (four) hours as needed for shortness of breath.    [provider]  aspirin EC 81 MG tablet Take 81 mg by mouth daily.     [provider]  clopidogrel (PLAVIX) 75 MG tablet Take 1 tablet (75 mg total) by mouth daily. 10/20/15   Arbutus Leas, NP  Cyanocobalamin (VITAMIN B-12 PO) Place 1 mL under the tongue at bedtime.     [provider]  donepezil  (ARICEPT) 5 MG tablet Take 1 tablet (5 mg total) by mouth at bedtime. 12/21/16   Colbert Ewing, MD  escitalopram (LEXAPRO) 10 MG tablet Take 1 tablet (10 mg total) at bedtime by mouth. 11/29/16   Rice, Resa Miner, MD  glipiZIDE (GLUCOTROL) 5 MG tablet Take 1 tablet (5 mg total) by mouth daily before breakfast. 12/31/16 01/30/17  Axel Filler, MD  Glycopyrrolate-Formoterol (BEVESPI AEROSPHERE) 9-4.8 MCG/ACT AERO Inhale 2 puffs into the lungs 2 (two) times daily. 11/08/16   Javier Glazier, MD  levothyroxine (SYNTHROID, LEVOTHROID) 25 MCG tablet Take 25 mcg by mouth daily before breakfast.    [provider]  metoprolol succinate (TOPROL-XL) 25 MG 24 hr tablet Take 2 tablets (50 mg total) by mouth daily. Take with or immediately following a meal. 12/14/16   Colbert Ewing, MD  Multiple Vitamin (MULTIVITAMIN WITH MINERALS) TABS tablet Take 1 tablet by mouth daily.     [provider]  nitroGLYCERIN (NITROSTAT) 0.4 MG SL tablet Place 0.4 mg under the tongue daily as needed for chest pain.     [provider]  ranitidine (ZANTAC) 150 MG tablet Take 1 tablet (150 mg total) by mouth 2 (two) times daily. 12/10/16   Javier Glazier, MD  Spacer/Aero Chamber Mouthpiece MISC 1 Device by Does not apply route as directed. Patient taking differently: 1 Device by Does not apply route 2 (two) times daily. Use with Bevespi and Proair 11/08/16   Javier Glazier, MD  torsemide (DEMADEX) 20 MG tablet Take 20 mg by mouth daily.  12/13/16   [provider]    Family History Family History  Problem Relation Age of Onset  . Heart disease Father   . Heart disease Mother   . Diabetes Sister   . Cancer Sister   . Diabetes Brother   . Diabetes Sister   . Lung disease Neg Hx     Social History Social History   Tobacco Use  . Smoking status: Former Smoker    Packs/day: 1.00    Years: 11.00    Pack years: 11.00    Types: Cigarettes    Start date: 09/09/1947     Last attempt to quit: 09/09/1958    Years since quitting: 58.3  . Smokeless tobacco: Never Used  . Tobacco comment: Significant second-hand exposure. Wife also smokes around him.  Substance Use Topics  . Alcohol use: Yes    Comment: 11/26/2016 ""nothing since 12/2010"  . Drug use: No     Allergies  Novocain [procaine]; Rosuvastatin calcium; and Statins   Review of Systems Review of Systems  Unable to perform ROS: Mental status change  Respiratory: Positive for shortness of breath.      Physical Exam Updated Vital Signs BP (!) 146/99 (BP Location: Right Arm)   Pulse 94   Temp 97.6 F (36.4 C) (Oral)   Resp 20   SpO2 99%   Physical Exam  Nursing note and vitals reviewed.  77 year old male, resting comfortably and in no acute distress. Vital signs are significant for hypertension. Oxygen saturation is 99%, which is normal. Head is normocephalic and atraumatic. PERRLA, EOMI. Oropharynx is clear. Neck is nontender and supple without adenopathy or JVD. Back is nontender and there is no CVA tenderness. Lungs are clear without rales, wheezes, or rhonchi. Chest is nontender. Heart has regular rate and rhythm without murmur. Abdomen is soft, flat, nontender without masses or hepatosplenomegaly and peristalsis is normoactive. Extremities have 1+ edema, full range of motion is present. Skin is warm and dry without rash. Neurologic: He is sleepy but easily arousable, oriented to person and place but not time, cranial nerves are intact, there are no motor or sensory deficits.  He is generally slow to answer questions.  No tremor noted.  No asterixis.  ED Treatments / Results  Labs (all labs ordered are listed, but only abnormal results are displayed) Labs Reviewed  BASIC METABOLIC PANEL - Abnormal; Notable for the following components:      Result Value   Glucose, Bld 145 (*)    Creatinine, Ser 1.42 (*)    GFR calc non Af Amer 46 (*)    GFR calc Af Amer 53 (*)    All  other components within normal limits  CBC - Abnormal; Notable for the following components:   RBC 3.79 (*)    Hemoglobin 9.2 (*)    HCT 30.7 (*)    MCH 24.3 (*)    RDW 17.0 (*)    All other components within normal limits  URINALYSIS, ROUTINE W REFLEX MICROSCOPIC  I-STAT TROPONIN, ED    EKG  EKG Interpretation  Date/Time:  Sunday January 16 2017 00:44:15 EST Ventricular Rate:  76 PR Interval:  210 QRS Duration: 68 QT Interval:  354 QTC Calculation: 398 R Axis:   -31 Text Interpretation:  Wandering atrial pacemaker Left axis deviation Inferior infarct , age undetermined Abnormal ECG When compared with ECG of 12/20/2016, Possible wandering atrial pacemaker has replaced ATRIAL PACED RHYTHM Confirmed by Delora Fuel ([id number]) on 01/16/2017 2:07:23 AM       Radiology Dg Chest 2 View  Result Date: 01/16/2017 CLINICAL DATA:  Shortness of breath for a few days. History of confusion, increasing today. More lethargic and increased urination. EXAM: CHEST  2 VIEW COMPARISON:  12/20/2016 FINDINGS: Cardiac pacemaker. Shallow inspiration. Cardiac enlargement without vascular congestion. No edema or consolidation. Linear atelectasis in the lung bases. No pneumothorax. No pleural effusions. Degenerative changes in the spine and shoulders. IMPRESSION: Shallow inspiration with atelectasis in the lung bases. Cardiac enlargement. No consolidation or edema. Electronically Signed   By: Lucienne Capers M.D.   On: 01/16/2017 01:12    Procedures Procedures (including critical care time)  Medications Ordered in ED Medications - No data to display   Initial Impression / Assessment and Plan / ED Course  I have reviewed the triage vital signs and the nursing notes.  Pertinent labs & imaging results that were available during my care of the patient were reviewed by  me and considered in my medical decision making (see chart for details).  Progressive confusion over the last week with nocturnal symptoms  being more prominent.  This is nonspecific, but suspicious for progression of underlying dementia.  Consider side effect from donepezil.  Initial laboratory workup shows stable renal insufficiency and stable anemia.  Chest x-ray shows no evidence of pneumonia.  Will check urinalysis to look for occult urinary tract infection.  We will also check hepatic function, ammonia, ABG.  Will send for CT of head.  ED workup is unremarkable.  CT shows no acute changes.  Moderate anemia is present and unchanged from baseline.  Mild renal insufficiency is present and unchanged from baseline.  ABG shows no evidence of CO2 retention.  Case is discussed with Dr. Benjamine Mola of internal medicine teaching service who agrees to admit the patient.  Final Clinical Impressions(s) / ED Diagnoses   Final diagnoses:  Altered mental status, unspecified altered mental status type  Renal insufficiency  Normocytic anemia    ED Discharge Orders    None       Delora Fuel, MD 32/54/98 [phone]

## 2017-01-16 NOTE — Progress Notes (Signed)
Subjective: [name] was seen this morning in the ED. He was resting comfortably in bed and had no complaints. He denied fevers or chills. He does not know why he is here.   Discussed with [name] over the phone. Re-iterated that sundowning is common in those with dementia and will likely worsen here in the hospital. Re-iterated that there was no overt infection and he should be best managed at home. Agreed to stop Aricept as his symptoms worsened since starting that medicine per her report.  Objective:  Vital signs in last 24 hours: Vitals:   01/16/17 0700 01/16/17 0730 01/16/17 0841 01/16/17 1024  BP: 112/75 104/74  135/79  Pulse: 61 60  61  Resp: (!) 23 (!) 22  18  Temp:      TempSrc:      SpO2: 93% 97% 94% 99%   GEN: Pleasant elderly male lying in bed in NAD. HENT: [name]/AT. No visible lesions. Mucous membranes now moist. RESP: Clear to auscultation bilaterally. No increased work of breathing. CV: Normal rate and regular rhythm. No murmurs.  ABD: Soft. Non-tender. Non-distended. Normoactive bowel sounds. EXT: No edema. Warm and well perfused. PSYCH: Patient is calm and pleasant. Appropriate affect. Well-groomed.  CBC Latest Ref Rng & Units 01/16/2017 12/20/2016 12/14/2016  WBC 4.0 - 10.5 K/uL 7.8 5.8 7.4  Hemoglobin 13.0 - 17.0 g/dL 9.2(L) 9.9(L) 9.1(L)  Hematocrit 39.0 - 52.0 % 30.7(L) 31.9(L) 29.2(L)  Platelets 150 - 400 K/uL 262 188 198   CMP Latest Ref Rng & Units 01/16/2017 12/20/2016 12/14/2016  Glucose 65 - 99 mg/dL 145(H) 141(H) 116(H)  BUN 6 - 20 mg/dL 14 14 23(H)  Creatinine 0.61 - 1.24 mg/dL 1.42(H) 1.35(H) 1.53(H)  Sodium 135 - 145 mmol/L 136 136 135  Potassium 3.5 - 5.1 mmol/L 3.9 3.9 4.0  Chloride 101 - 111 mmol/L 101 105 104  CO2 22 - 32 mmol/L 27 26 23   Calcium 8.9 - 10.3 mg/dL 9.3 9.0 8.6(L)  Total Protein 6.5 - 8.1 g/dL 6.8 6.7 -  Total Bilirubin 0.3 - 1.2 mg/dL 0.8 0.4 -  Alkaline Phos 38 - 126 U/L 126 105 -  AST 15 - 41 U/L 41 40 -  ALT 17 - 63  U/L 30 47 -   Assessment/Plan:  Principal Problem:   Encephalopathy Active Problems:   Essential hypertension   Dementia   OSA (obstructive sleep apnea)   Type 2 diabetes mellitus without complication, without long-term current use of insulin (HCC)   Confusion  Delirium complicating Dementia Wife notes he has been more confused during the evenings and was concerned there could be an underlying infection instead of sundowning responsible for his symptoms. No infectious source has been identified and patient looks well. He was a bit dry on initial exam, likely due to reduced PO intake, but improved with gentle IVF. Wife notes his mental status has worsened since the Aricept was added and we agreed to discontinue this medicine. She is agreeable to discharge today. She fortunately has experience with Dementia patients and is aware of Sundowning. She is having a difficult time watching her husband go through this process. She asks about resources for spouses of those with dementia and has been provided some in the discharge paperwork. He has an appointment scheduled with Neurology on January 9 as well as a primary care appointment next week as well. - Delirium precautions - Follow up with Neurology as scheduled on 01/19/2017 - stable for discharge today -STOP donepezil on discharge  HFpEF Stable. Continue Torsemide.   COPD Restrictive lung disease Stable- continue home brovana and ellipta  DM2 BG 121. -SSI -CBG monitoring  Dispo: Anticipated discharge today.   Brandon Seltzer, DO 01/16/2017, 12:09 PM Pager: Mamie Nick [phone]

## 2017-01-16 NOTE — ED Triage Notes (Signed)
Pt c/o shortness of breath off and on for a few days. Family reports that pt has a hx of confusion, and confusion has been increasing today, pt seems more lethargic and has increased urination. Pt slow to respond to questions.

## 2017-01-16 NOTE — ED Notes (Signed)
Ordered diet tray 

## 2017-01-16 NOTE — ED Notes (Signed)
Admit Doctors at bedside. 

## 2017-01-16 NOTE — ED Notes (Addendum)
Family also reports that pt has had increased tremors and unsteady gait, no recent falls. Pt appears uncomfortable in triage, but denies pain.

## 2017-01-16 NOTE — H&P (Signed)
Date: 01/16/2017               Patient Name:  [name] MRN: [id number]  DOB: March 10, 1940 Age / Sex: 77 y.o., male   PCP: Brandon Mage, MD         Medical Service: Internal Medicine Teaching Service         Attending Physician: Dr. Lucious Groves, DO    First Contact: Dr. Tarri Hendrix Pager: [phone]  Second Contact: Dr. Danford Hendrix Pager: [phone]       After Hours (After 5p/  First Contact Pager: [phone]  weekends / holidays): Second Contact Pager: [phone]   Chief Complaint: confusion  History of Present Illness:  [name] is a 77yo male with PMH significant for moderate-severe dementia ([place] 15), complete heart block s/p PPM, CAD s/p stenting on aspirin and Plavix, OSA, COPD, [name], CVA, and HFpEF who presents with increasing confusion and lethargy for the last week, worsening today. Wife is at bedside and provided additional history.  He was recently admitted 12/10-12/11 with acute encephalopathy thought to be secondary to dementia. His symptoms resolved prior to discharge and he was discharged on donepezil 5mg  daily. Since that time, wife states that he had been doing well for 2 weeks, but has been progressively more confused for the last week, particularly at night, when he is roaming around and stating that he needs to go home. Today, his wife notes he couldn't remember how to go up steps and was unable to feed himself. She also notes that he has been more somnolent and has had some trouble walking around, stating that he is "shuffling". His mental status and confusion wax and wane. She denies any other symptoms, including cough, emesis, or diarrhea. He is scheduled for a Neurology appointment on January 9.   The patient states that he has some trouble breathing, but denies cough or chest pain.  He normally ambulates with a walker or cane, or holding on to various railings throughout the house. He is normally able to take a shower, dress himself, and feed himself, although wife  states that today, he had trouble with this.  ED Course: - BP 110/82, HR 65, temp 97.6, RR 20, O2 97% on RA - CBC without leukocytosis, Hb 9.2 (baseline ~8.6-10.3). Troponin negative. BMP with Cr 1.42 (baseline 1.3-1.6). UA without UTI. ABG wnl. LFTs wnl. Ammonia nl. - CXR with cardiac enlargement, no evidence of PNA. CT head without acute intracranial abnormality. EKG with wandering atrial pacemaker.  Meds:  Current Meds  Medication Sig  . acetaminophen (TYLENOL) 500 MG tablet Take 500 mg by mouth every 6 (six) hours as needed for headache.  . albuterol (PROAIR HFA) 108 (90 Base) MCG/ACT inhaler Inhale 1-2 puffs into the lungs every 4 (four) hours as needed for shortness of breath.  Marland Kitchen aspirin EC 81 MG tablet Take 81 mg by mouth daily.   . clopidogrel (PLAVIX) 75 MG tablet Take 1 tablet (75 mg total) by mouth daily.  . Cyanocobalamin (VITAMIN B-12 PO) Place 1 mL under the tongue at bedtime.   . donepezil (ARICEPT) 5 MG tablet Take 1 tablet (5 mg total) by mouth at bedtime.  Marland Kitchen escitalopram (LEXAPRO) 10 MG tablet Take 1 tablet (10 mg total) at bedtime by mouth.  Marland Kitchen glipiZIDE (GLUCOTROL) 5 MG tablet Take 1 tablet (5 mg total) by mouth daily before breakfast.  . Glycopyrrolate-Formoterol (BEVESPI AEROSPHERE) 9-4.8 MCG/ACT AERO Inhale 2 puffs into the lungs 2 (two) times daily.  Marland Kitchen  levothyroxine (SYNTHROID, LEVOTHROID) 25 MCG tablet Take 25 mcg by mouth daily before breakfast.  . metoprolol succinate (TOPROL-XL) 25 MG 24 hr tablet Take 2 tablets (50 mg total) by mouth daily. Take with or immediately following a meal.  . Multiple Vitamin (MULTIVITAMIN WITH MINERALS) TABS tablet Take 1 tablet by mouth daily.   . nitroGLYCERIN (NITROSTAT) 0.4 MG SL tablet Place 0.4 mg under the tongue daily as needed for chest pain.   . ranitidine (ZANTAC) 150 MG tablet Take 1 tablet (150 mg total) by mouth 2 (two) times daily.  Marland Kitchen Spacer/Aero Chamber Mouthpiece MISC 1 Device by Does not apply route as directed.  (Patient taking differently: 1 Device by Does not apply route 2 (two) times daily. Use with Bevespi and Proair)  . torsemide (DEMADEX) 20 MG tablet Take 20 mg by mouth daily.    Allergies: Allergies as of 01/16/2017 - Review Complete 01/16/2017  Allergen Reaction Noted  . Novocain [procaine] Other (See Comments) 03/04/2015  . Rosuvastatin calcium Other (See Comments) 03/14/2015  . Statins Other (See Comments) 03/04/2015   Past Medical History:  Diagnosis Date  . Anxiety   . Arthritis    "knees, back, shoulders" (11/26/2016)  . CAD (coronary artery disease)    a. remote stenting >20 years ago. b. PTCA unknown vessel ~2013. c. 10/2015: s/p rotational atherectomy/DES to distal RCA and RPDA c/b embolic CVA  . CHB (complete heart block) ([place]) 02/2015   [name] 03/03/2015  . Chronic diastolic CHF (congestive heart failure) ([place])   . Chronic shoulder pain    "both shoulders" (11/26/2016)  . [name] (chronic kidney disease), stage III ([place])   . Confusion 12/2016  . COPD (chronic obstructive pulmonary disease) ([place])   . Depression   . Diabetes mellitus without complication ([place])    TYPE 2  . Dilated aortic root ([place])    a. mild by echo 09/2015.  Marland Kitchen GERD (gastroesophageal reflux disease)   . Headache    "weekly" (11/26/2016)  . Heart attack [facility]) 'many years ago'  . High cholesterol   . Hypertension   . Hypothyroidism   . OSA (obstructive sleep apnea)    "suppose to have a mask; they haven't got one adjusted for him yet" (11/26/2016)  . Presence of permanent cardiac pacemaker   . 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 Skin cancer of face    "had it cut off"  . Statin intolerance   . Stroke [facility]) 10/2015   "mini stroke; been having memory issues & confusion ever since" (11/26/2016)  . Symptomatic bradycardia    a. syncope/sinus arrest and bradycardia s/p St. Jude PPM 02/2015 with lead revision 03/2015.   Family History:  Family History  Problem Relation Age of Onset  . Heart disease Father   .  Heart disease Mother   . Diabetes Sister   . Cancer Sister   . Diabetes Brother   . Diabetes Sister   . Lung disease Neg Hx    Social History:  - Denies smoking, alcohol use, or other illicit drug use - Lives at home with wife and niece - Used to be a Theme park manager - Typically ambulates with a cane or walker or holds onto various railings in the house. Able to dress himself and maintain personal hygiene at baseline. Receives assistance with medications from wife and niece.  Review of Systems: Unable to obtain secondary to altered mental status.  Physical Exam: Blood pressure 110/79, pulse 63, temperature 97.6 F (36.4 C), temperature source Oral, resp. rate 19,  SpO2 96 %.  GEN: Elderly male sleeping in bed in NAD. Very sleepy, but easily arousable. Falls back asleep quickly throughout interview and exam. Alert and oriented to name, birthday, location (hospital), (not oriented to year or name of hospital). Able to identify his wife. HENT: [place]/AT. Dry mucous membranes. No visible lesions. EYES: Sclera non-icteric. Conjunctiva clear. RESP: Clear to auscultation bilaterally. No wheezes, rales, or rhonchi. No increased work of breathing. CV: Normal rate and regular rhythm. No murmurs, gallops, or rubs. 1+ LE edema. ABD: Soft. Non-tender. Non-distended. Normoactive bowel sounds. EXT: 1+ LE edema. Warm. 2+ radial pulses bilaterally.  SKIN: Capillary refill <2 sec. Poor skin turgor. NEURO: Cranial nerves II-XII grossly intact. 4+/5 BUE and BLE strength. Normal sensation. No apparent audiovisual hallucinations. Speech fluent and intermittently confused. Cognitive slowing. Able to respond to questions appropriately.  Labs CBC Latest Ref Rng & Units 01/16/2017 12/20/2016 12/14/2016  WBC 4.0 - 10.5 K/uL 7.8 5.8 7.4  Hemoglobin 13.0 - 17.0 g/dL 9.2(L) 9.9(L) 9.1(L)  Hematocrit 39.0 - 52.0 % 30.7(L) 31.9(L) 29.2(L)  Platelets 150 - 400 K/uL 262 188 198   CMP Latest Ref Rng & Units 01/16/2017 12/20/2016  12/14/2016  Glucose 65 - 99 mg/dL 145(H) 141(H) 116(H)  BUN 6 - 20 mg/dL 14 14 23(H)  Creatinine 0.61 - 1.24 mg/dL 1.42(H) 1.35(H) 1.53(H)  Sodium 135 - 145 mmol/L 136 136 135  Potassium 3.5 - 5.1 mmol/L 3.9 3.9 4.0  Chloride 101 - 111 mmol/L 101 105 104  CO2 22 - 32 mmol/L 27 26 23   Calcium 8.9 - 10.3 mg/dL 9.3 9.0 8.6(L)  Total Protein 6.5 - 8.1 g/dL 6.8 6.7 -  Total Bilirubin 0.3 - 1.2 mg/dL 0.8 0.4 -  Alkaline Phos 38 - 126 U/L 126 105 -  AST 15 - 41 U/L 41 40 -  ALT 17 - 63 U/L 30 47 -   ABG 7.418/37.4/71.0 UA negative for UTI Troponin negative Ammonia normal  EKG: wandering atrial pacemaker, LAD  CXR: poor inspiration with cardiac enlargement. No evidence of consolidation or effusion.  CT head: No acute intracranial abnormalities. Chronic atrophy and small vessel ischemic changes.  Assessment & Plan by Problem: Active Problems:   Encephalopathy  Mr. Lagrand is a 77yo male with PMH significant for moderate-severe dementia ([place] 15), complete heart block s/p PPM, CAD s/p stenting on aspirin and Plavix, OSA, COPD, [name], CVA, and HFpEF who presents with increasing confusion and lethargy for the last week, worsening today.  Encephalopathy, in the setting of moderate to severe dementia Patient is afebrile and no leukocytosis. Wife states that he has not complained of any new symptoms recently. No source of infection identified on CXR or UA to explain this change in his mental status. Wife notes shuffling gait, as well as waxing and waning confusion and ability to complete ADLs, particularly at night, which is consistent with progression of underlying dementia. At baseline orientation on my exam, although he is quite sleepy. He was recently started on donepezil 5mg  in anticipation of his outpatient Neurology appointment on January 9. He also has dry mucus membranes and poor skin turgor on exam, likely secondary to insufficient PO intake in the setting of dementia. - Telemetry - 500cc  IVF bolus - Continue home donepezil 5mg  daily - Delirium precautions - PT/OT eval - Outpatient follow up with Neurology on 01/19/2017  HTN, HFpEF, CAD s/p stents Echo with LVEF 60-65%, grade 1 DD. BP 105/70. HR 66. On torsemide 20mg  daily and metoprolol 50mg  qday, as  well as aspirin 81mg  daily and plavix 75 mg daily - Hold torsemide given low normal BP as well as signs of dehydration on exam. - Continue home aspirin 81mg  daily, plavix 75mg  daily, and metoprolol 50mg  daily  COPD Restrictive lung disease Stable, no wheezing on exam. Not hypoxic. - Continue home ellipta  DM2 BG 145. - Continue home glipizide 5mg  daily - CBG monitoring  OSA Patient does not tolerate CPAP machine. May be contributing to the daytime sleepiness that wife notes. ABG wnl.  Diet: Carb modified VTE PPx: Lovenox Code Status: Partial (DNI, he does want CPR with compressions, however does not want artificial life-prolonging measures like tube feeding or long-term mechanical ventilation) Dispo: Admit patient to Observation with expected length of stay less than 2 midnights.  Signed: Colbert Ewing, MD 01/16/2017, 5:07 AM  Pager: Mamie Nick [phone]

## 2017-01-16 NOTE — ED Notes (Signed)
Patient moved to holding area.

## 2017-01-19 DIAGNOSIS — G4733 Obstructive sleep apnea (adult) (pediatric): Secondary | ICD-10-CM | POA: Diagnosis not present

## 2017-01-19 DIAGNOSIS — I442 Atrioventricular block, complete: Secondary | ICD-10-CM | POA: Diagnosis not present

## 2017-01-19 DIAGNOSIS — R4189 Other symptoms and signs involving cognitive functions and awareness: Secondary | ICD-10-CM | POA: Diagnosis not present

## 2017-01-19 DIAGNOSIS — Z950 Presence of cardiac pacemaker: Secondary | ICD-10-CM | POA: Diagnosis not present

## 2017-01-19 DIAGNOSIS — F0280 Dementia in other diseases classified elsewhere without behavioral disturbance: Secondary | ICD-10-CM | POA: Diagnosis not present

## 2017-01-19 DIAGNOSIS — R9389 Abnormal findings on diagnostic imaging of other specified body structures: Secondary | ICD-10-CM | POA: Diagnosis not present

## 2017-01-20 NOTE — Progress Notes (Signed)
   CC: Dementia  HPI:  [name] is a 77 y.o. who presented for dementia follow up. Please see problem based charting for evaluation, assessment, and plan.   Past Medical History:  Diagnosis Date  . Anxiety   . Arthritis    "knees, back, shoulders" (11/26/2016)  . CAD (coronary artery disease)    a. remote stenting >20 years ago. b. PTCA unknown vessel ~2013. c. 10/2015: s/p rotational atherectomy/DES to distal RCA and RPDA c/b embolic CVA  . CHB (complete heart block) ([place]) 02/2015   [name] 03/03/2015  . Chronic diastolic CHF (congestive heart failure) ([place])   . Chronic shoulder pain    "both shoulders" (11/26/2016)  . [name] (chronic kidney disease), stage III ([place])   . Confusion 12/2016  . COPD (chronic obstructive pulmonary disease) ([place])   . Depression   . Diabetes mellitus without complication ([place])    TYPE 2  . Dilated aortic root ([place])    a. mild by echo 09/2015.  Marland Kitchen GERD (gastroesophageal reflux disease)   . Headache    "weekly" (11/26/2016)  . Heart attack [facility]) 'many years ago'  . High cholesterol   . Hypertension   . Hypothyroidism   . OSA (obstructive sleep apnea)    "suppose to have a mask; they haven't got one adjusted for him yet" (11/26/2016)  . Presence of permanent cardiac pacemaker   . 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 Skin cancer of face    "had it cut off"  . Statin intolerance   . Stroke [facility]) 10/2015   "mini stroke; been having memory issues & confusion ever since" (11/26/2016)  . Symptomatic bradycardia    a. syncope/sinus arrest and bradycardia s/p St. Jude PPM 02/2015 with lead revision 03/2015.   Review of Systems:  Denies headaches, dizziness, sob, chest pain, weakness  Physical Exam:  Vitals:   01/21/17 1408  BP: 102/83  Pulse: 76  Temp: 97.7 F (36.5 C)  TempSrc: Oral  SpO2: 100%  Weight: 223 lb 4.8 oz (101.3 kg)  Height: 6\' 1"  (1.854 m)   Physical Exam  Constitutional: He is oriented to person, place, and time. He appears  well-developed and well-nourished. No distress.  HENT:  Head: Normocephalic and atraumatic.  Cardiovascular: Normal rate, regular rhythm, normal heart sounds and intact distal pulses.  No murmur heard. Respiratory: Breath sounds normal. No respiratory distress. He has no wheezes.  GI: Soft. Bowel sounds are normal. He exhibits no distension. There is no tenderness.  Musculoskeletal: He exhibits no edema or tenderness.  Neurological: He is alert and oriented to person, place, and time.  Slow to respond   Skin: No rash noted. He is not diaphoretic. No erythema.  Psychiatric: He has a normal mood and affect. His behavior is normal. Judgment and thought content normal.    Assessment & Plan:   See Encounters Tab for problem based charting.  Patient seen with Dr. Angelia Mould

## 2017-01-21 ENCOUNTER — Ambulatory Visit (INDEPENDENT_AMBULATORY_CARE_PROVIDER_SITE_OTHER): Payer: Medicare HMO | Admitting: Internal Medicine

## 2017-01-21 ENCOUNTER — Other Ambulatory Visit: Payer: Self-pay

## 2017-01-21 VITALS — BP 102/83 | HR 76 | Temp 97.7°F | Ht 73.0 in | Wt 223.3 lb

## 2017-01-21 DIAGNOSIS — F0390 Unspecified dementia without behavioral disturbance: Secondary | ICD-10-CM

## 2017-01-21 DIAGNOSIS — Z9119 Patient's noncompliance with other medical treatment and regimen: Secondary | ICD-10-CM

## 2017-01-21 DIAGNOSIS — Z23 Encounter for immunization: Secondary | ICD-10-CM

## 2017-01-21 DIAGNOSIS — Z79890 Hormone replacement therapy (postmenopausal): Secondary | ICD-10-CM

## 2017-01-21 DIAGNOSIS — I10 Essential (primary) hypertension: Secondary | ICD-10-CM | POA: Diagnosis not present

## 2017-01-21 DIAGNOSIS — Z79899 Other long term (current) drug therapy: Secondary | ICD-10-CM | POA: Diagnosis not present

## 2017-01-21 DIAGNOSIS — E119 Type 2 diabetes mellitus without complications: Secondary | ICD-10-CM

## 2017-01-21 DIAGNOSIS — Z0000 Encounter for general adult medical examination without abnormal findings: Secondary | ICD-10-CM | POA: Insufficient documentation

## 2017-01-21 DIAGNOSIS — Z87891 Personal history of nicotine dependence: Secondary | ICD-10-CM

## 2017-01-21 DIAGNOSIS — E039 Hypothyroidism, unspecified: Secondary | ICD-10-CM | POA: Diagnosis not present

## 2017-01-21 DIAGNOSIS — J449 Chronic obstructive pulmonary disease, unspecified: Secondary | ICD-10-CM | POA: Diagnosis not present

## 2017-01-21 DIAGNOSIS — G4733 Obstructive sleep apnea (adult) (pediatric): Secondary | ICD-10-CM

## 2017-01-21 DIAGNOSIS — E8801 Alpha-1-antitrypsin deficiency: Secondary | ICD-10-CM | POA: Diagnosis not present

## 2017-01-21 DIAGNOSIS — Z7984 Long term (current) use of oral hypoglycemic drugs: Secondary | ICD-10-CM

## 2017-01-21 NOTE — Assessment & Plan Note (Signed)
The patients blood pressure during this visit is 102/83. The patient is currently on metoprolol 50mg  qd, torsemide 20mg  qd  -continue metoprolol 50mg  qd and torsemide 20mg  qd

## 2017-01-21 NOTE — Assessment & Plan Note (Signed)
-  Influenza vaccine-refused -urine microalbumin ordered  -Pneumococcal 13 vaccine given and he will be due for 23

## 2017-01-21 NOTE — Assessment & Plan Note (Addendum)
The patient's last a1c=7.7 in November 2018. The patient's home blood glucose measurements over the past month have ranged 120-150s. The patient does not note symptoms of hypoglycemia.  The patient is currently taking Glypizide 5mg . He is compliant with medication. No significant weight changes. He was 223lbs on his visit today.  -Continue Glipizide 5mg  qd -will check hgba1c in April/May 2019 -urine microalbumin collectd

## 2017-01-21 NOTE — Patient Instructions (Addendum)
It was a pleasure to see you today [name]. Please make the following changes:  -get pulmonary function tests at pulmonologist office  -please follow up about namenda and modafanil prescribed by neurologist   If you have any questions or concerns, please call our clinic at [phone] between 9am-5pm and after hours call [phone] and ask for the internal medicine resident on call. If you feel you are having a medical emergency please call 911.   Thank you, we look forward to help you remain healthy!  Brandon Mage, MD Internal Medicine PGY1

## 2017-01-21 NOTE — Assessment & Plan Note (Signed)
The patient's last TSH is 1.209 (March 2018) and free T4=0.96 (March 2018). He was prescribed levothyroxine by a previous provider.  -Continue Levothyroxine 65mcg qd

## 2017-01-21 NOTE — Assessment & Plan Note (Addendum)
The patient has copd and is being follow by pulmonology who suspect that the severity of his copd is being masked by a restrictive component. 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 The patient has not had pfts done recently as he was hospitalized the day he needed to get them done.   -Instructed patient to follow with pulmonology to get pfts done -Continue cpap -Continue albuterol

## 2017-01-21 NOTE — Assessment & Plan Note (Signed)
The patient has been having progressive decline in his mentation for the past 6 years. The patient was recently hospitalized for confusion and agitation that was thought to be due to sundowning and his dementia. CT head did not show any acute abnormalities. The patient's wife reports that he also had slurred speech and had a tremor in his left upper extremity that lasted for 2 days. The patient was recently seen by neurology (Dr. 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 The patient was prescribed namenda, provigil, and melatonin. The patient's wife states that one of the medications was not approved by insurance, but she will try to follow up on how to obtain the medication.   -Continue Namenda   -Continue melatonin to help with sundowning -continue provigil prescribed by neuroogy -EEG pending

## 2017-01-22 LAB — MICROALBUMIN / CREATININE URINE RATIO
CREATININE, UR: 294.5 mg/dL
MICROALBUM., U, RANDOM: 35 ug/mL
Microalb/Creat Ratio: 11.9 mg/g creat (ref 0.0–30.0)

## 2017-01-24 NOTE — Progress Notes (Signed)
Internal Medicine Clinic Attending  I saw and evaluated the patient.  I personally confirmed the key portions of the history and exam documented by Dr. Chundi and I reviewed pertinent patient test results.  The assessment, diagnosis, and plan were formulated together and I agree with the documentation in the resident's note. 

## 2017-02-09 DIAGNOSIS — R404 Transient alteration of awareness: Secondary | ICD-10-CM | POA: Diagnosis not present

## 2017-02-17 ENCOUNTER — Emergency Department (HOSPITAL_COMMUNITY): Payer: Medicare HMO

## 2017-02-17 ENCOUNTER — Telehealth: Payer: Self-pay | Admitting: Internal Medicine

## 2017-02-17 ENCOUNTER — Other Ambulatory Visit: Payer: Self-pay

## 2017-02-17 ENCOUNTER — Observation Stay (HOSPITAL_COMMUNITY)
Admission: EM | Admit: 2017-02-17 | Discharge: 2017-02-18 | Disposition: A | Discharge status: Home / Self Care | Payer: Medicare HMO | Attending: Oncology | Admitting: Oncology

## 2017-02-17 ENCOUNTER — Encounter (HOSPITAL_COMMUNITY): Payer: Self-pay | Admitting: Emergency Medicine

## 2017-02-17 DIAGNOSIS — I5032 Chronic diastolic (congestive) heart failure: Secondary | ICD-10-CM | POA: Insufficient documentation

## 2017-02-17 DIAGNOSIS — Z8673 Personal history of transient ischemic attack (TIA), and cerebral infarction without residual deficits: Secondary | ICD-10-CM | POA: Insufficient documentation

## 2017-02-17 DIAGNOSIS — R4020 Unspecified coma: Secondary | ICD-10-CM | POA: Diagnosis not present

## 2017-02-17 DIAGNOSIS — Z79899 Other long term (current) drug therapy: Secondary | ICD-10-CM | POA: Diagnosis not present

## 2017-02-17 DIAGNOSIS — I130 Hypertensive heart and chronic kidney disease with heart failure and stage 1 through stage 4 chronic kidney disease, or unspecified chronic kidney disease: Secondary | ICD-10-CM | POA: Diagnosis not present

## 2017-02-17 DIAGNOSIS — N2889 Other specified disorders of kidney and ureter: Secondary | ICD-10-CM

## 2017-02-17 DIAGNOSIS — R05 Cough: Secondary | ICD-10-CM

## 2017-02-17 DIAGNOSIS — S0990XA Unspecified injury of head, initial encounter: Secondary | ICD-10-CM | POA: Diagnosis not present

## 2017-02-17 DIAGNOSIS — Z7982 Long term (current) use of aspirin: Secondary | ICD-10-CM | POA: Insufficient documentation

## 2017-02-17 DIAGNOSIS — R404 Transient alteration of awareness: Secondary | ICD-10-CM | POA: Diagnosis not present

## 2017-02-17 DIAGNOSIS — R531 Weakness: Secondary | ICD-10-CM | POA: Diagnosis not present

## 2017-02-17 DIAGNOSIS — G4730 Sleep apnea, unspecified: Secondary | ICD-10-CM | POA: Diagnosis not present

## 2017-02-17 DIAGNOSIS — S199XXA Unspecified injury of neck, initial encounter: Secondary | ICD-10-CM | POA: Diagnosis not present

## 2017-02-17 DIAGNOSIS — N183 Chronic kidney disease, stage 3 unspecified: Secondary | ICD-10-CM

## 2017-02-17 DIAGNOSIS — N179 Acute kidney failure, unspecified: Principal | ICD-10-CM

## 2017-02-17 DIAGNOSIS — Z7984 Long term (current) use of oral hypoglycemic drugs: Secondary | ICD-10-CM | POA: Insufficient documentation

## 2017-02-17 DIAGNOSIS — F0280 Dementia in other diseases classified elsewhere without behavioral disturbance: Secondary | ICD-10-CM

## 2017-02-17 DIAGNOSIS — W1830XA Fall on same level, unspecified, initial encounter: Secondary | ICD-10-CM | POA: Insufficient documentation

## 2017-02-17 DIAGNOSIS — N189 Chronic kidney disease, unspecified: Secondary | ICD-10-CM

## 2017-02-17 DIAGNOSIS — E860 Dehydration: Secondary | ICD-10-CM

## 2017-02-17 DIAGNOSIS — R059 Cough, unspecified: Secondary | ICD-10-CM

## 2017-02-17 DIAGNOSIS — W19XXXA Unspecified fall, initial encounter: Secondary | ICD-10-CM | POA: Diagnosis present

## 2017-02-17 DIAGNOSIS — G300 Alzheimer's disease with early onset: Secondary | ICD-10-CM

## 2017-02-17 LAB — CBC WITH DIFFERENTIAL/PLATELET
Basophils Absolute: 0 10*3/uL (ref 0.0–0.1)
Basophils Relative: 0 %
EOS ABS: 0.4 10*3/uL (ref 0.0–0.7)
Eosinophils Relative: 5 %
HEMATOCRIT: 29.4 % — AB (ref 39.0–52.0)
HEMOGLOBIN: 8.7 g/dL — AB (ref 13.0–17.0)
LYMPHS ABS: 2.1 10*3/uL (ref 0.7–4.0)
Lymphocytes Relative: 26 %
MCH: 23.6 pg — ABNORMAL LOW (ref 26.0–34.0)
MCHC: 29.6 g/dL — AB (ref 30.0–36.0)
MCV: 79.7 fL (ref 78.0–100.0)
MONOS PCT: 8 %
Monocytes Absolute: 0.7 10*3/uL (ref 0.1–1.0)
NEUTROS ABS: 4.9 10*3/uL (ref 1.7–7.7)
NEUTROS PCT: 61 %
Platelets: 260 10*3/uL (ref 150–400)
RBC: 3.69 MIL/uL — AB (ref 4.22–5.81)
RDW: 17.6 % — ABNORMAL HIGH (ref 11.5–15.5)
WBC: 8 10*3/uL (ref 4.0–10.5)

## 2017-02-17 LAB — COMPREHENSIVE METABOLIC PANEL
ALK PHOS: 144 U/L — AB (ref 38–126)
ALT: 23 U/L (ref 17–63)
AST: 44 U/L — ABNORMAL HIGH (ref 15–41)
Albumin: 3.1 g/dL — ABNORMAL LOW (ref 3.5–5.0)
Anion gap: 13 (ref 5–15)
BILIRUBIN TOTAL: 0.3 mg/dL (ref 0.3–1.2)
BUN: 26 mg/dL — ABNORMAL HIGH (ref 6–20)
CALCIUM: 9.4 mg/dL (ref 8.9–10.3)
CO2: 26 mmol/L (ref 22–32)
CREATININE: 2.53 mg/dL — AB (ref 0.61–1.24)
Chloride: 102 mmol/L (ref 101–111)
GFR calc Af Amer: 26 mL/min — ABNORMAL LOW (ref >60)
GFR calc non Af Amer: 23 mL/min — ABNORMAL LOW (ref >60)
GLUCOSE: 112 mg/dL — AB (ref 65–99)
Potassium: 3.4 mmol/L — ABNORMAL LOW (ref 3.5–5.1)
SODIUM: 141 mmol/L (ref 135–145)
TOTAL PROTEIN: 6.9 g/dL (ref 6.5–8.1)

## 2017-02-17 LAB — CBG MONITORING, ED: GLUCOSE-CAPILLARY: 71 mg/dL (ref 65–99)

## 2017-02-17 LAB — URINALYSIS, ROUTINE W REFLEX MICROSCOPIC
BILIRUBIN URINE: NEGATIVE
GLUCOSE, UA: NEGATIVE mg/dL
HGB URINE DIPSTICK: NEGATIVE
KETONES UR: NEGATIVE mg/dL
Leukocytes, UA: NEGATIVE
Nitrite: NEGATIVE
PROTEIN: NEGATIVE mg/dL
Specific Gravity, Urine: 1.015 (ref 1.005–1.030)
pH: 5 (ref 5.0–8.0)

## 2017-02-17 LAB — GLUCOSE, CAPILLARY: GLUCOSE-CAPILLARY: 158 mg/dL — AB (ref 65–99)

## 2017-02-17 MED ORDER — LEVOTHYROXINE SODIUM 25 MCG PO TABS
25.0000 ug | ORAL_TABLET | Freq: Every day | ORAL | Status: DC
Start: 2017-02-18 — End: 2017-02-18
  Administered 2017-02-18: 25 ug via ORAL
  Filled 2017-02-17 (×2): qty 1

## 2017-02-17 MED ORDER — INSULIN ASPART 100 UNIT/ML ~~LOC~~ SOLN: 0.0000 [IU] | Freq: Three times a day (TID) | SUBCUTANEOUS | Status: DC

## 2017-02-17 MED ORDER — CLOPIDOGREL BISULFATE 75 MG PO TABS
75.0000 mg | ORAL_TABLET | Freq: Every day | ORAL | Status: DC
  Administered 2017-02-17 – 2017-02-18 (×2): 75 mg via ORAL
  Filled 2017-02-17 (×2): qty 1

## 2017-02-17 MED ORDER — FAMOTIDINE 20 MG PO TABS
20.0000 mg | ORAL_TABLET | Freq: Every day | ORAL | Status: DC
  Administered 2017-02-18: 20 mg via ORAL
  Filled 2017-02-17: qty 1

## 2017-02-17 MED ORDER — ENOXAPARIN SODIUM 30 MG/0.3ML ~~LOC~~ SOLN: 30.0000 mg | SUBCUTANEOUS | Status: DC

## 2017-02-17 MED ORDER — ASPIRIN EC 81 MG PO TBEC
81.0000 mg | DELAYED_RELEASE_TABLET | Freq: Every day | ORAL | Status: DC
Start: 2017-02-17 — End: 2017-02-18
  Administered 2017-02-18: 81 mg via ORAL
  Filled 2017-02-17: qty 1

## 2017-02-17 MED ORDER — SODIUM CHLORIDE 0.9 % IV SOLN
INTRAVENOUS | Status: DC
  Administered 2017-02-17: 10:00:00 via INTRAVENOUS

## 2017-02-17 MED ORDER — ESCITALOPRAM OXALATE 10 MG PO TABS
10.0000 mg | ORAL_TABLET | Freq: Every day | ORAL | Status: DC
  Administered 2017-02-17: 10 mg via ORAL
  Filled 2017-02-17: qty 1

## 2017-02-17 MED ORDER — ACETAMINOPHEN 650 MG RE SUPP: 650.0000 mg | Freq: Four times a day (QID) | RECTAL | Status: DC | PRN

## 2017-02-17 MED ORDER — ACETAMINOPHEN 325 MG PO TABS: 650.0000 mg | ORAL_TABLET | Freq: Four times a day (QID) | ORAL | Status: DC | PRN

## 2017-02-17 MED ORDER — SODIUM CHLORIDE 0.9 % IV SOLN: INTRAVENOUS | Status: DC

## 2017-02-17 MED ORDER — ALBUTEROL SULFATE (2.5 MG/3ML) 0.083% IN NEBU: 2.5000 mg | INHALATION_SOLUTION | Freq: Four times a day (QID) | RESPIRATORY_TRACT | Status: DC | PRN

## 2017-02-17 MED ORDER — INSULIN ASPART 100 UNIT/ML ~~LOC~~ SOLN: 0.0000 [IU] | Freq: Every day | SUBCUTANEOUS | Status: DC

## 2017-02-17 MED ORDER — SODIUM CHLORIDE 0.9 % IV BOLUS (SEPSIS)
1000.0000 mL | Freq: Once | INTRAVENOUS | Status: AC
  Administered 2017-02-17: 1000 mL via INTRAVENOUS

## 2017-02-17 MED ORDER — METOPROLOL SUCCINATE ER 50 MG PO TB24
50.0000 mg | ORAL_TABLET | Freq: Every day | ORAL | Status: DC
  Administered 2017-02-17 – 2017-02-18 (×2): 50 mg via ORAL
  Filled 2017-02-17 (×2): qty 1

## 2017-02-17 MED ORDER — ARFORMOTEROL TARTRATE 15 MCG/2ML IN NEBU
15.0000 ug | INHALATION_SOLUTION | Freq: Two times a day (BID) | RESPIRATORY_TRACT | Status: DC
  Filled 2017-02-17 (×2): qty 2

## 2017-02-17 NOTE — ED Triage Notes (Signed)
Pt to ED via [facility] - from home, with c/o fall-- wife states that pt got up off couch, she thinks that he fell after getting feet tangled in a blanket-- pt has no complaints--  Pt has hx of dementia/alzheimer's -- wife states that he has been more confused with a poor appetite and not sleeping well for past few days.  On arrival pt is confused (baseline)-- no obvious injuries.  Equal grips,

## 2017-02-17 NOTE — ED Notes (Signed)
Pt eating dinner, family assisting pt.

## 2017-02-17 NOTE — H&P (Signed)
Date: 02/17/2017               Patient Name:  [name] MRN: [id number]  DOB: July 25, 1940 Age / Sex: 77 y.o., male   PCP: Lars Mage, MD         Medical Service: Internal Medicine Teaching Service         Attending Physician: Dr. Annia Belt, MD    First Contact: Dr. Ronalee Red Pager: [phone]  Second Contact: Dr. Philipp Ovens Pager: [phone]       After Hours (After 5p/  First Contact Pager: [phone]  weekends / holidays): Second Contact Pager: [phone]   Chief Complaint: fall  History of Present Illness: Mr. Kluttz is a 77yo male with PMH significant for moderate-severe dementia ([place] 15), complete heart block s/p PPM, CAD s/p stenting on aspirin and Plavix, OSA (not on CPAP), COPD, [name], CVA, and HFpEF (LVEF 60-65%, grade 1 DD in 10/2016) who presents after a fall. Wife at bedside who provides additional history.  Patient cannot recall the events from earlier today. He denies current headache, chest pain, dysuria, or shortness of breath. Wife states that they were sitting in the bedroom earlier this morning when she got up to get something. She heard a thud behind her and returned to the bathroom to find that the patient had fallen and was on the ground with her head against the bathroom cabinet. Fall was unwitnessed. She denied any lesion or blood on his head. She also noted that he had a blanket wrapped around his legs when he was sitting and she does not think that he took the blanket off of him prior to getting up. She states that he has not been complaining of anything prior to this fall, however she has noticed that he has been more weak than typical. She has also noted decreased PO intake recently, although has been able to take his prescribed medications (did not take them this morning).  He had a Neurology appointment on January 9, where he was thought to have senile dementia Alzheimer's type vs related to underlying other medical conditions. Recommended to take  melatonin 3mg  after dinner, modafinil 100mg  first thing in the morning, and low-dose Namenda. Wife states he had an EEG but she was never told the results. They have a follow up in about 2 months from now.  At baseline, patient is able to walk with a cane or walker, but has been more weak than usual recently. Patient is normally able to shower, dress himself, and feed himself.  ED Course: - BP 110/80, HR 73, RR 16, temp 98, O2 96% on RA - CMP with K 3.4, Cr 2.53, BUN 26, AP 144. CBC with Hb 8.7 - CXR with no acute change. CT head and c-spine negative for acute disease. - Received 1L NS bolus  Meds:  No outpatient medications have been marked as taking for the 02/17/17 encounter [facility] Encounter).  - acetaminophen - albuterol inhaler, bevespi - aspirin, plavix - vitamin B12, MVI - aricept - lexapro - glipizide - levothyroxine - ranitidine - torsemide - metoprolol  Allergies: Allergies as of 02/17/2017 - Review Complete 01/16/2017  Allergen Reaction Noted  . Novocain [procaine] Other (See Comments) 03/04/2015  . Rosuvastatin calcium Other (See Comments) 03/14/2015  . Statins Other (See Comments) 03/04/2015   Past Medical History:  Diagnosis Date  . Anxiety   . Arthritis    "knees, back, shoulders" (11/26/2016)  . CAD (coronary artery disease)    a.  remote stenting >20 years ago. b. PTCA unknown vessel ~2013. c. 10/2015: s/p rotational atherectomy/DES to distal RCA and RPDA c/b embolic CVA  . CHB (complete heart block) ([place]) 02/2015   [name] 03/03/2015  . Chronic diastolic CHF (congestive heart failure) ([place])   . Chronic shoulder pain    "both shoulders" (11/26/2016)  . [name] (chronic kidney disease), stage III ([place])   . Confusion 12/2016  . COPD (chronic obstructive pulmonary disease) ([place])   . Depression   . Diabetes mellitus without complication ([place])    TYPE 2  . Dilated aortic root ([place])    a. mild by echo 09/2015.  Marland Kitchen GERD (gastroesophageal reflux disease)   .  Headache    "weekly" (11/26/2016)  . Heart attack [facility]) 'many years ago'  . High cholesterol   . Hypertension   . Hypothyroidism   . OSA (obstructive sleep apnea)    "suppose to have a mask; they haven't got one adjusted for him yet" (11/26/2016)  . Presence of permanent cardiac pacemaker   . 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 Skin cancer of face    "had it cut off"  . Statin intolerance   . Stroke [facility]) 10/2015   "mini stroke; been having memory issues & confusion ever since" (11/26/2016)  . Symptomatic bradycardia    a. syncope/sinus arrest and bradycardia s/p St. Jude PPM 02/2015 with lead revision 03/2015.    Family History:  Family History  Problem Relation Age of Onset  . Heart disease Father   . Heart disease Mother   . Diabetes Sister   . Cancer Sister   . Diabetes Brother   . Diabetes Sister   . Lung disease Neg Hx    Social History:  Social History   Socioeconomic History  . Marital status: Married    Spouse name: None  . Number of children: None  . Years of education: None  . Highest education level: None  Social Needs  . Financial resource strain: None  . Food insecurity - worry: None  . Food insecurity - inability: None  . Transportation needs - medical: None  . Transportation needs - non-medical: None  Occupational History  . None  Tobacco Use  . Smoking status: Former Smoker    Packs/day: 1.00    Years: 11.00    Pack years: 11.00    Types: Cigarettes    Start date: 09/09/1947    Last attempt to quit: 09/09/1958    Years since quitting: 58.4  . Smokeless tobacco: Never Used  . Tobacco comment: Significant second-hand exposure. Wife also smokes around him.  Substance and Sexual Activity  . Alcohol use: Yes    Comment: 11/26/2016 ""nothing since 12/2010"  . Drug use: No  . Sexual activity: Not Currently  Other Topics Concern  . None  Social History Narrative   [facility] (10/25/16):   Originally from [place]. Has lived in [place] most of his life.  He was a Company secretary. No pets currently. No bird exposure. No mold exposure. Previously like going camping. 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 Significant exposure to dust, etc through his work. No known asbestos exposure.    Review of Systems: A complete ROS was unable to be obtained secondary to AMS  Physical Exam: Blood pressure 110/80, pulse 73, temperature 98 F (36.7 C), temperature source Oral, resp. rate 16, weight 223 lb (101.2 kg), SpO2 96 %. GEN: Elderly. Alert. Oriented to self. No acute distress.  HENT: [place]/AT. Dry  mucous membranes. No visible lesions. No evidence of open wound, erythema, or swelling on forehead EYES: PERRL. Sclera non-icteric. Conjunctiva clear. RESP: Clear to auscultation bilaterally. No wheezes, rales, or rhonchi. No increased work of breathing. CV: Normal rate and regular rhythm. Systolic murmur best heard at RUSB. No LE edema. SKIN: Capillary refill <2sec. Poor skin turgor. ABD: Soft. Non-tender. Non-distended. Normoactive bowel sounds. EXT: No edema. Warm and well perfused. NEURO: (Limited 2/2 AMS) Mild resting tremor in bilateral hands. Cranial nerves II-XII grossly intact. Able to lift all four extremities against gravity. Normal sensation in all 4 extremities. No apparent audiovisual hallucinations. Slow to respond PSYCH: Patient is calm and pleasant. Appropriate affect. Well-groomed; speech is appropriate, slow to respond.  Labs CBC Latest Ref Rng & Units 02/17/2017 01/16/2017 12/20/2016  WBC 4.0 - 10.5 K/uL 8.0 7.8 5.8  Hemoglobin 13.0 - 17.0 g/dL 8.7(L) 9.2(L) 9.9(L)  Hematocrit 39.0 - 52.0 % 29.4(L) 30.7(L) 31.9(L)  Platelets 150 - 400 K/uL 260 262 188   CMP Latest Ref Rng & Units 02/17/2017 01/16/2017 12/20/2016  Glucose 65 - 99 mg/dL 112(H) 145(H) 141(H)  BUN 6 - 20 mg/dL 26(H) 14 14  Creatinine 0.61 - 1.24 mg/dL 2.53(H) 1.42(H) 1.35(H)  Sodium 135 - 145 mmol/L 141 136 136  Potassium 3.5 - 5.1 mmol/L 3.4(L) 3.9 3.9  Chloride  101 - 111 mmol/L 102 101 105  CO2 22 - 32 mmol/L 26 27 26   Calcium 8.9 - 10.3 mg/dL 9.4 9.3 9.0  Total Protein 6.5 - 8.1 g/dL 6.9 6.8 6.7  Total Bilirubin 0.3 - 1.2 mg/dL 0.3 0.8 0.4  Alkaline Phos 38 - 126 U/L 144(H) 126 105  AST 15 - 41 U/L 44(H) 41 40  ALT 17 - 63 U/L 23 30 47   CXR: personally reviewed my interpretation is low lung volumes, small right pleural effusion, cardiomegaly, appears overall similar to prior  CT head/C-spine 02/17/2017 No acute intracranial abnormality. Atrophy, chronic microvascular disease. No acute bony abnormality in the cervical spine.  Assessment & Plan by Problem: Active Problems:   Fall  Mr. Schnabel is a 77yo male with PMH significant for moderate-severe dementia ([place] 15), complete heart block s/p PPM, CAD s/p stenting on aspirin and Plavix, OSA, COPD, [name], CVA, and HFpEF (LVEF 60-65%, grade 1 DD in 10/2016) who presents after what seems most consistent with unwitnessed mechanical fall. However, patient unable to recall any events from earlier today and he has an extensive cardiac history, thus will need to monitor for cardiac etiology. He does appear dehydrated on exam. Otherwise, his exam is non-focal although limited 2/2 AMS.  S/p unwitnessed fall Likely mechanical, although difficult to rule out cardiac etiology. CBC and CMP without evidence for an etiology of his fall. UA pending, although patient denies dysuria or other urinary symptoms. No other infectious s/s. CXR without evidence of consolidation. Patient is afebrile with stable vital signs. Wife notes that he has had decreased intake recently, so he may have been more weak or lightheaded leading to a fall. Patient currently appears comfortable without evidence of trauma on his head and has no complaints. CT head/C-spine without acute abnormalities. - Telemetry - s/p 1L IV NS, will d/c further fluids given hx of HFpEF - EKG  AKI on [name] Cr 2.53 on admission (baseline ~1.4-1.6). Poor skin  turgor and dry mucus membranes on exam. - BMET in AM - Avoid nephrotoxic agents  OSA Non-compliant with mask at home. - CPAP  Complete heart block, s/p PPM CAD s/p stents  HFpEF (LVEF 60-65%, grade 1 DD in 10/2016) No chest pain, no shortness of breath. Hemodynamically stable. - Continue aspirin and plavix - Continue home metoprolol 50mg  daily - Hold home torsemide 20mg  daily 2/2 AKI  Dementia ([place] 15) Hx of CVA Follows with Neurology. - Delirium precautions  COPD Home regimen includes albuterol and bevespi. No wheezing on exam, saturating well on RA. Not in acute exacerbation. - [place] nebulizer BID - albuterol nebulizer q6h PRN  Diet: HH VTE PPx: Lovenox 30mg  (renally dosed) Code status: Full code (confirmed with wife on admission) Dispo: Admit patient to Observation with expected length of stay less than 2 midnights.  Signed: Colbert Ewing, MD 02/17/2017, 1:17 PM  Pager: Mamie Nick [phone]

## 2017-02-17 NOTE — ED Notes (Signed)
Pt's CBG result was 71. Informed Myriam Jacobson - RN.

## 2017-02-17 NOTE — ED Notes (Signed)
Pt in X ray

## 2017-02-17 NOTE — ED Provider Notes (Signed)
[facility] EMERGENCY DEPARTMENT Provider Note   CSN: [id number] Arrival date & time: 02/17/17  5701     History   Chief Complaint Chief Complaint  Patient presents with  . Fall    HPI [name] is a 77 y.o. male.  77 year old male here from home with his wife after he fell.  Fall was unwitnessed and no obvious injury noted.  Patient does have a history of worsening dementia.  Wife states that for the past 2 days he has had declining status consisting of not being as alert as well as having trouble with his gait.  She denies any fever, vomiting, diarrhea.  No new medications.  She also describes a tremor which seems to wax and wane.  Nothing makes that better or worse.      Past Medical History:  Diagnosis Date  . Anxiety   . Arthritis    "knees, back, shoulders" (11/26/2016)  . CAD (coronary artery disease)    a. remote stenting >20 years ago. b. PTCA unknown vessel ~2013. c. 10/2015: s/p rotational atherectomy/DES to distal RCA and RPDA c/b embolic CVA  . CHB (complete heart block) ([place]) 02/2015   [name] 03/03/2015  . Chronic diastolic CHF (congestive heart failure) ([place])   . Chronic shoulder pain    "both shoulders" (11/26/2016)  . [name] (chronic kidney disease), stage III ([place])   . Confusion 12/2016  . COPD (chronic obstructive pulmonary disease) ([place])   . Depression   . Diabetes mellitus without complication ([place])    TYPE 2  . Dilated aortic root ([place])    a. mild by echo 09/2015.  Marland Kitchen GERD (gastroesophageal reflux disease)   . Headache    "weekly" (11/26/2016)  . Heart attack [facility]) 'many years ago'  . High cholesterol   . Hypertension   . Hypothyroidism   . OSA (obstructive sleep apnea)    "suppose to have a mask; they haven't got one adjusted for him yet" (11/26/2016)  . Presence of permanent cardiac pacemaker   . 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 Skin cancer of face    "had it cut off"  . Statin intolerance   . Stroke [facility]) 10/2015   "mini  stroke; been having memory issues & confusion ever since" (11/26/2016)  . Symptomatic bradycardia    a. syncope/sinus arrest and bradycardia s/p St. Jude PPM 02/2015 with lead revision 03/2015.    Patient Active Problem List   Diagnosis Date Noted  . Healthcare maintenance 01/21/2017  . Encephalopathy 01/16/2017  . Confusion   . Type 2 diabetes mellitus without complication, without long-term current use of insulin ([place]) 11/29/2016  . SVT (supraventricular tachycardia) ([place]) 11/26/2016  . Alpha-1-antitrypsin deficiency carrier ([place]) 11/25/2016  . Restrictive lung disease 11/08/2016  . COPD (chronic obstructive pulmonary disease) ([place]) 10/25/2016  . Pulmonary hypertension ([place]) 10/15/2016  . Shortness of breath 10/12/2016  . Gait disturbance 07/20/2016  . Edema of lower extremity 04/08/2016  . Other dietary vitamin B12 deficiency anemia 01/15/2016  . Anemia, macrocytic 12/24/2015  . Frail elderly 11/14/2015  . [name] stage G3a/A1, GFR 45-59 and albumin creatinine ratio <30 mg/g ([place]) 11/05/2015  . Hyperlipidemia   . Essential hypertension   . S/P [facility] Feb 2017 10/23/2015  . CAD-S/P remote PCI 20 yrs ago, ? 4 years ago, and s/p RCA PCI/DES 10/22/15 10/23/2015  . Cerebral thrombosis with cerebral infarction 10/23/2015  . DOE (dyspnea on exertion)   . Accelerating angina ([place])   . Hypothyroidism 07/09/2015  .  Fatigue due to depression 05/09/2015  . Moderate single current episode of major depressive disorder ([place]) 05/09/2015  . 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 Retinal detachment, tractional, right eye 03/15/2015  . Hepatic cyst 03/11/2015  . Contracture of ankle and foot joint 03/10/2015  . Dysphagia 03/10/2015  . Edema 03/10/2015  . History of ST elevation myocardial infarction (STEMI) 03/10/2015  . Low back pain 03/10/2015  . Dementia 03/10/2015  . Mediastinal lymphadenopathy 03/10/2015  . Obesity 03/10/2015  . Osteoarthritis 03/10/2015  . Penile  erection impairment 03/10/2015  . Personal history of noncompliance with medical treatment, presenting hazards to health 03/10/2015  . 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 Symptomatic bradycardia 03/04/2015  . Complete atrioventricular block ([place]) 03/03/2015  . Anxiety about health 02/19/2015  . Diastolic CHF ([place]) 56/25/6389  . 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 LVH (left ventricular hypertrophy) 02/19/2015  . OSA (obstructive sleep apnea) 02/19/2015  . Rotator cuff tear, right 02/19/2015  . Abnormal CT of the chest 02/10/2015  . Non-Q wave myocardial infarction ([place]) 09/09/2011    Past Surgical History:  Procedure Laterality Date  . CARDIAC CATHETERIZATION N/A 10/17/2015   Procedure: Left Heart Cath and Coronary Angiography;  Surgeon: Jettie Booze, MD;  Location: [facility] CV LAB;  Service: Cardiovascular;  Laterality: N/A;  . CARDIAC CATHETERIZATION N/A 10/22/2015   Procedure: Coronary Stent Intervention Rotablater;  Surgeon: Jettie Booze, MD;  Location: [facility];  Service: Cardiovascular;  Laterality: N/A;  . CARDIAC CATHETERIZATION N/A 10/22/2015   Procedure: Left Heart Cath and Coronary Angiography;  Surgeon: Jettie Booze, MD;  Location: [facility];  Service: Cardiovascular;  Laterality: N/A;  . CATARACT EXTRACTION W/ INTRAOCULAR LENS  IMPLANT, BILATERAL Bilateral 2016  . CORONARY ANGIOPLASTY  2012  . CORONARY ANGIOPLASTY WITH STENT PLACEMENT  2001  . CORONARY ANGIOPLASTY WITH STENT PLACEMENT     "took 2 out and put 1 longer one in"  . EP IMPLANTABLE DEVICE N/A 03/04/2015   Procedure: Pacemaker Implant;  Surgeon: Will Meredith Leeds, MD;  Location: [facility];  Service: Cardiovascular;  Laterality: N/A;  . EP IMPLANTABLE DEVICE N/A 04/10/2015   Procedure: PPM Lead Revision/Repair;  Surgeon: Will Meredith Leeds, MD;  Location: [place] [facility];  Service: Cardiovascular;  Laterality: N/A;  . EYE SURGERY    .  INSERT / REPLACE / REMOVE PACEMAKER    . RETINAL DETACHMENT SURGERY Bilateral    "several on each side"       Home Medications    Prior to Admission medications   Medication Sig Start Date End Date Taking? Authorizing Provider  acetaminophen (TYLENOL) 500 MG tablet Take 500 mg by mouth every 6 (six) hours as needed for headache.    [provider]  albuterol (PROAIR HFA) 108 (90 Base) MCG/ACT inhaler Inhale 1-2 puffs into the lungs every 4 (four) hours as needed for shortness of breath.    [provider]  aspirin EC 81 MG tablet Take 81 mg by mouth daily.     [provider]  clopidogrel (PLAVIX) 75 MG tablet Take 1 tablet (75 mg total) by mouth daily. 10/20/15   Arbutus Leas, NP  Cyanocobalamin (VITAMIN B-12 PO) Place 1 mL under the tongue at bedtime.     [provider]  escitalopram (LEXAPRO) 10 MG tablet Take 1 tablet (10 mg total) at bedtime by mouth. 11/29/16   Rice, Resa Miner, MD  glipiZIDE (GLUCOTROL) 5 MG tablet  Take 1 tablet (5 mg total) by mouth daily before breakfast. 12/31/16 01/30/17  Axel Filler, MD  Glycopyrrolate-Formoterol (BEVESPI AEROSPHERE) 9-4.8 MCG/ACT AERO Inhale 2 puffs into the lungs 2 (two) times daily. 11/08/16   Javier Glazier, MD  levothyroxine (SYNTHROID, LEVOTHROID) 25 MCG tablet Take 25 mcg by mouth daily before breakfast.    [provider]  Melatonin 3 MG TABS Take 3 mg by mouth daily.    [provider]  metoprolol succinate (TOPROL-XL) 25 MG 24 hr tablet Take 2 tablets (50 mg total) by mouth daily. Take with or immediately following a meal. 12/14/16   Colbert Ewing, MD  Multiple Vitamin (MULTIVITAMIN WITH MINERALS) TABS tablet Take 1 tablet by mouth daily.     [provider]  nitroGLYCERIN (NITROSTAT) 0.4 MG SL tablet Place 0.4 mg under the tongue daily as needed for chest pain.     [provider]  ranitidine (ZANTAC) 150 MG tablet Take 1 tablet (150 mg total) by  mouth 2 (two) times daily. 12/10/16   Javier Glazier, MD  ranitidine (ZANTAC) 150 MG tablet Take 150 mg by mouth 2 (two) times daily.    [provider]  Spacer/Aero Chamber Mouthpiece MISC 1 Device by Does not apply route as directed. Patient taking differently: 1 Device by Does not apply route 2 (two) times daily. Use with Bevespi and Proair 11/08/16   Javier Glazier, MD  torsemide (DEMADEX) 20 MG tablet Take 20 mg by mouth daily.  12/13/16   [provider]    Family History Family History  Problem Relation Age of Onset  . Heart disease Father   . Heart disease Mother   . Diabetes Sister   . Cancer Sister   . Diabetes Brother   . Diabetes Sister   . Lung disease Neg Hx     Social History Social History   Tobacco Use  . Smoking status: Former Smoker    Packs/day: 1.00    Years: 11.00    Pack years: 11.00    Types: Cigarettes    Start date: 09/09/1947    Last attempt to quit: 09/09/1958    Years since quitting: 58.4  . Smokeless tobacco: Never Used  . Tobacco comment: Significant second-hand exposure. Wife also smokes around him.  Substance Use Topics  . Alcohol use: Yes    Comment: 11/26/2016 ""nothing since 12/2010"  . Drug use: No     Allergies   Novocain [procaine]; Rosuvastatin calcium; and Statins   Review of Systems Review of Systems  All other systems reviewed and are negative.    Physical Exam Updated Vital Signs BP 110/80 (BP Location: Right Arm)   Pulse 73   Temp 98 F (36.7 C) (Oral)   Resp 16   SpO2 96%   Physical Exam  Constitutional: He appears well-developed and well-nourished.  Non-toxic appearance. No distress.  HENT:  Head: Normocephalic and atraumatic.  Eyes: Conjunctivae, EOM and lids are normal. Pupils are equal, round, and reactive to light.  Neck: Normal range of motion. Neck supple. No tracheal deviation present. No thyroid mass present.  Cardiovascular: Normal rate, regular rhythm and normal heart  sounds. Exam reveals no gallop.  No murmur heard. Pulmonary/Chest: Effort normal and breath sounds normal. No stridor. No respiratory distress. He has no decreased breath sounds. He has no wheezes. He has no rhonchi. He has no rales.  Abdominal: Soft. Normal appearance and bowel sounds are normal. He exhibits no distension. There is no tenderness.  There is no rebound and no CVA tenderness.  Musculoskeletal: Normal range of motion. He exhibits no edema or tenderness.  Neurological: He is disoriented. He displays atrophy. He displays no tremor. No cranial nerve deficit or sensory deficit. GCS eye subscore is 4. GCS verbal subscore is 5. GCS motor subscore is 6.  Skin: Skin is warm and dry. No abrasion and no rash noted.  Psychiatric: His affect is blunt. His speech is delayed. He is slowed. He is inattentive.  Nursing note and vitals reviewed.    ED Treatments / Results  Labs (all labs ordered are listed, but only abnormal results are displayed) Labs Reviewed  URINE CULTURE  CBC WITH DIFFERENTIAL/PLATELET  COMPREHENSIVE METABOLIC PANEL  URINALYSIS, ROUTINE W REFLEX MICROSCOPIC    EKG  EKG Interpretation None       Radiology No results found.  Procedures Procedures (including critical care time)  Medications Ordered in ED Medications  0.9 %  sodium chloride infusion (not administered)     Initial Impression / Assessment and Plan / ED Course  I have reviewed the triage vital signs and the nursing notes.  Pertinent labs & imaging results that were available during my care of the patient were reviewed by me and considered in my medical decision making (see chart for details).    Patient with evidence of acute kidney injury and will be given IV hydration admitted to the internal medicine service  Final Clinical Impressions(s) / ED Diagnoses   Final diagnoses:  Cough    ED Discharge Orders    None       Lacretia Leigh, MD 02/17/17 1213

## 2017-02-17 NOTE — Telephone Encounter (Signed)
Thanks for letting me know Hoyle Sauer! Have a great day!

## 2017-02-17 NOTE — ED Notes (Signed)
Attempted report x1. 

## 2017-02-17 NOTE — ED Notes (Signed)
Ordered dinner tray.  

## 2017-02-17 NOTE — Telephone Encounter (Signed)
Received call from patient's niece to inform me that patient fell this morning and hit his head on the closet door. Patient is already en route to the hospital via EMS.

## 2017-02-18 DIAGNOSIS — Z950 Presence of cardiac pacemaker: Secondary | ICD-10-CM

## 2017-02-18 DIAGNOSIS — G309 Alzheimer's disease, unspecified: Secondary | ICD-10-CM | POA: Diagnosis not present

## 2017-02-18 DIAGNOSIS — Z7902 Long term (current) use of antithrombotics/antiplatelets: Secondary | ICD-10-CM

## 2017-02-18 DIAGNOSIS — Z9119 Patient's noncompliance with other medical treatment and regimen: Secondary | ICD-10-CM

## 2017-02-18 DIAGNOSIS — E860 Dehydration: Secondary | ICD-10-CM

## 2017-02-18 DIAGNOSIS — G4733 Obstructive sleep apnea (adult) (pediatric): Secondary | ICD-10-CM

## 2017-02-18 DIAGNOSIS — Z8673 Personal history of transient ischemic attack (TIA), and cerebral infarction without residual deficits: Secondary | ICD-10-CM | POA: Diagnosis not present

## 2017-02-18 DIAGNOSIS — Z79899 Other long term (current) drug therapy: Secondary | ICD-10-CM | POA: Diagnosis not present

## 2017-02-18 DIAGNOSIS — Z888 Allergy status to other drugs, medicaments and biological substances status: Secondary | ICD-10-CM

## 2017-02-18 DIAGNOSIS — Z7982 Long term (current) use of aspirin: Secondary | ICD-10-CM

## 2017-02-18 DIAGNOSIS — F0280 Dementia in other diseases classified elsewhere without behavioral disturbance: Secondary | ICD-10-CM | POA: Diagnosis not present

## 2017-02-18 DIAGNOSIS — I5030 Unspecified diastolic (congestive) heart failure: Secondary | ICD-10-CM

## 2017-02-18 DIAGNOSIS — Z884 Allergy status to anesthetic agent status: Secondary | ICD-10-CM

## 2017-02-18 DIAGNOSIS — Z9181 History of falling: Secondary | ICD-10-CM | POA: Diagnosis not present

## 2017-02-18 DIAGNOSIS — N179 Acute kidney failure, unspecified: Secondary | ICD-10-CM

## 2017-02-18 DIAGNOSIS — D500 Iron deficiency anemia secondary to blood loss (chronic): Secondary | ICD-10-CM

## 2017-02-18 DIAGNOSIS — N189 Chronic kidney disease, unspecified: Secondary | ICD-10-CM

## 2017-02-18 LAB — BASIC METABOLIC PANEL
Anion gap: 14 (ref 5–15)
BUN: 21 mg/dL — AB (ref 6–20)
CHLORIDE: 104 mmol/L (ref 101–111)
CO2: 26 mmol/L (ref 22–32)
Calcium: 9.3 mg/dL (ref 8.9–10.3)
Creatinine, Ser: 1.85 mg/dL — ABNORMAL HIGH (ref 0.61–1.24)
GFR calc Af Amer: 39 mL/min — ABNORMAL LOW (ref >60)
GFR calc non Af Amer: 33 mL/min — ABNORMAL LOW (ref >60)
GLUCOSE: 97 mg/dL (ref 65–99)
POTASSIUM: 3.6 mmol/L (ref 3.5–5.1)
Sodium: 144 mmol/L (ref 135–145)

## 2017-02-18 LAB — URINE CULTURE: CULTURE: NO GROWTH

## 2017-02-18 LAB — IRON AND TIBC
Iron: 30 ug/dL — ABNORMAL LOW (ref 45–182)
SATURATION RATIOS: 8 % — AB (ref 17.9–39.5)
TIBC: 357 ug/dL (ref 250–450)
UIBC: 327 ug/dL

## 2017-02-18 LAB — GLUCOSE, CAPILLARY
GLUCOSE-CAPILLARY: 81 mg/dL (ref 65–99)
GLUCOSE-CAPILLARY: 104 mg/dL — AB (ref 65–99)

## 2017-02-18 LAB — FERRITIN: Ferritin: 59 ng/mL (ref 24–336)

## 2017-02-18 MED ORDER — FERROUS SULFATE 325 (65 FE) MG PO TABS
325.0000 mg | ORAL_TABLET | Freq: Every day | ORAL | Status: DC
  Administered 2017-02-18: 325 mg via ORAL
  Filled 2017-02-18: qty 1

## 2017-02-18 MED ORDER — FERROUS SULFATE 325 (65 FE) MG PO TABS: 325.0000 mg | ORAL_TABLET | Freq: Every day | ORAL | 0 refills | Status: DC

## 2017-02-18 MED ORDER — ENOXAPARIN SODIUM 40 MG/0.4ML ~~LOC~~ SOLN
40.0000 mg | SUBCUTANEOUS | Status: DC
  Administered 2017-02-18: 40 mg via SUBCUTANEOUS

## 2017-02-18 NOTE — Discharge Summary (Signed)
Name: [name] MRN: [id number] DOB: 04/24/1940 77 y.o. PCP: Brandon Mage, MD  Date of Admission: 02/17/2017  8:16 AM Date of Discharge: 02/18/2017 Attending Physician: Annia Belt, MD  Discharge Diagnosis: 1. Fall 2. AKI 3. Chronic normocytic anemia  Active Problems:   Fall   Discharge Medications: Allergies as of 02/18/2017      Reactions   Novocain [procaine] Other (See Comments)   Hallucinations   Rosuvastatin Calcium Other (See Comments)   Whole body ache/pain   Statins Other (See Comments)   Whole body ache/pain      Medication List    STOP taking these medications   torsemide 20 MG tablet Commonly known as:  DEMADEX     TAKE these medications   acetaminophen 500 MG tablet Commonly known as:  TYLENOL Take 500 mg by mouth every 6 (six) hours as needed for headache.   aspirin EC 81 MG tablet Take 81 mg by mouth daily.   clopidogrel 75 MG tablet Commonly known as:  PLAVIX Take 1 tablet (75 mg total) by mouth daily. What changed:  when to take this   escitalopram 10 MG tablet Commonly known as:  LEXAPRO Take 1 tablet (10 mg total) at bedtime by mouth.   ferrous sulfate 325 (65 FE) MG tablet Take 1 tablet (325 mg total) by mouth daily with breakfast. Start taking on:  02/19/2017   glipiZIDE 5 MG tablet Commonly known as:  GLUCOTROL Take 1 tablet (5 mg total) by mouth daily before breakfast.   Glycopyrrolate-Formoterol 9-4.8 MCG/ACT Aero Commonly known as:  BEVESPI AEROSPHERE Inhale 2 puffs into the lungs 2 (two) times daily. What changed:    when to take this  reasons to take this   levothyroxine 25 MCG tablet Commonly known as:  SYNTHROID, LEVOTHROID Take 25 mcg by mouth daily before breakfast.   Melatonin 3 MG Tabs Take 3 mg by mouth daily.   memantine 5 MG tablet Commonly known as:  NAMENDA Take 5 mg by mouth 2 (two) times daily.   metoprolol succinate 25 MG 24 hr tablet Commonly known as:  TOPROL-XL Take 2 tablets (50  mg total) by mouth daily. Take with or immediately following a meal.   modafinil 100 MG tablet Commonly known as:  PROVIGIL Take 100 mg by mouth at bedtime.   multivitamin with minerals Tabs tablet Take 1 tablet by mouth daily.   nitroGLYCERIN 0.4 MG SL tablet Commonly known as:  NITROSTAT Place 0.4 mg under the tongue daily as needed for chest pain.   PROAIR HFA 108 (90 Base) MCG/ACT inhaler Generic drug:  albuterol Inhale 1-2 puffs into the lungs every 4 (four) hours as needed for shortness of breath.   ranitidine 150 MG tablet Commonly known as:  ZANTAC Take 1 tablet (150 mg total) by mouth 2 (two) times daily.   VITAMIN B-12 PO Place 1 mL under the tongue at bedtime.       Disposition and follow-up:   [name] was discharged from [facility] in Stable condition.  At the hospital follow up visit please address:  1.  Patient had AKI that was improving on discharge. Home torsemide 20mg  daily was held during admission and on discharge. Patient was instructed to continue holding torsemide and to RESTART torsemide on Sunday 2/10. Please recheck BMP and assess volume status  2.  Patient discharged with [facility] PT, please ask whether this has been set up.  3.  Patient has chronic normocytic anemia. Iron was  initiated during hospitalization - please assess whether patient has been able to pick this up and take this medication. Please also f/u IFE and Kappa/Lambda light chains and complete further work-up if indicated.  4.  Labs / imaging needed at time of follow-up: BMP for Cr  5.  Pending labs/ test needing follow-up: IFE, Kappa/Lambda light chains, UCx  Follow-up Appointments: Follow-up Information    Brandon Mage, MD. Go on 02/21/2017.   Specialty:  Internal Medicine Why:  at 10:15am Contact information: [place] [address] [place] Hospital Course by problem list: Active Problems:   Fall   1. Unwitnessed  fall Patient was admitted on 2/7 after an unwitnessed mechanical fall at home and reported head trauma. No exterior wounds, bleeding, or swelling noted. CT head and cervical spine negative for acute pathology. No evidence of UTI or pneumonia, no infectious s/s. Telemetry unremarkable. Patient does not recall events leading to admission and was at his baseline mentation on discharge. Patient discharged with [facility] PT, which wife states had been helpful in the past.  2. AKI Patient had AKI - Cr 2.53 (baseline ~1.4-1.6), likely secondary to volume depletion due to decreased PO intake. Appeared volume down on exam. He received gentle IVF resuscitation (given hx of HFpEF) and Cr improved the following morning (1.85). Home torsemide was held during the admission and patient was instructed to continue holding and to restart home torsemide 20mg  daily on Sunday 2/10 with plan to follow up in clinic.  3. Chronic normocytic anemia  Work-up was initiated while patient was inpatient with plan to follow up remaining results outpatient. Iron low, saturation ratio low, ferritin low-normal. Patient started on oral iron. Please follow up IFE and Kappa/Lambda light chains to evaluate for MDS and consider further work-up if indicated depending on results.  4. Dementia, Hx of CVA CT head negative for acute pathology. Patient follows with Neurology. They feel dementia is secondary to Alzheimer's vs underlying medical conditions (including OSA, not compliant with CPAP). Has a follow up appointment in ~2 months. Mentation back to baseline on discharge.  5. Hx of HFpEF No chest pain or shortness of breath. Hemodynamically stable. Aspirin, plavix, and metoprolol continued while inpatient. Torsemide held secondary to AKI and plan to restart on Sunday 2/10 (see AKI above).  Discharge Vitals:   BP (!) 147/90   Pulse 60   Temp 98 F (36.7 C) (Oral)   Resp 18   Ht 6' (1.829 m)   Wt 211 lb 13.8 oz (96.1 kg)   SpO2 95%   BMI  28.73 kg/m   Pertinent Labs, Studies, and Procedures:  CBC Latest Ref Rng & Units 02/17/2017 01/16/2017 12/20/2016  WBC 4.0 - 10.5 K/uL 8.0 7.8 5.8  Hemoglobin 13.0 - 17.0 g/dL 8.7(L) 9.2(L) 9.9(L)  Hematocrit 39.0 - 52.0 % 29.4(L) 30.7(L) 31.9(L)  Platelets 150 - 400 K/uL 260 262 188   CMP Latest Ref Rng & Units 02/18/2017 02/17/2017 01/16/2017  Glucose 65 - 99 mg/dL 97 112(H) 145(H)  BUN 6 - 20 mg/dL 21(H) 26(H) 14  Creatinine 0.61 - 1.24 mg/dL 1.85(H) 2.53(H) 1.42(H)  Sodium 135 - 145 mmol/L 144 141 136  Potassium 3.5 - 5.1 mmol/L 3.6 3.4(L) 3.9  Chloride 101 - 111 mmol/L 104 102 101  CO2 22 - 32 mmol/L 26 26 27   Calcium 8.9 - 10.3 mg/dL 9.3 9.4 9.3  Total Protein 6.5 - 8.1 g/dL - 6.9 6.8  Total  Bilirubin 0.3 - 1.2 mg/dL - 0.3 0.8  Alkaline Phos 38 - 126 U/L - 144(H) 126  AST 15 - 41 U/L - 44(H) 41  ALT 17 - 63 U/L - 23 30   UA negative for UTI UCx pending Ferritin 59 Iron 30, TIBC 357, saturation ratio 8, UIBC 327 IFE pending Kappa/Lambda light chains pending  CT head/cervical spine 02/17/2017 No acute intracranial abnormality. Atrophy, chronic microvascular disease. No acute bony abnormality in the cervical spine.  CXR 02/17/2017 Very low lung volumes. Left base atelectasis and right base atelectasis or infiltrate. Small right effusion. Cardiomegaly. No real change.  Discharge Instructions: Discharge Instructions    (HEART FAILURE PATIENTS) Call MD:  Anytime you have any of the following symptoms: 1) 3 pound weight gain in 24 hours or 5 pounds in 1 week 2) shortness of breath, with or without a dry hacking cough 3) swelling in the hands, feet or stomach 4) if you have to sleep on extra pillows at night in order to breathe.   Complete by:  As directed    Call MD for:  difficulty breathing, headache or visual disturbances   Complete by:  As directed    Call MD for:  persistant dizziness or light-headedness   Complete by:  As directed    Call MD for:  redness, tenderness, or  signs of infection (pain, swelling, redness, odor or green/yellow discharge around incision site)   Complete by:  As directed    Call MD for:  severe uncontrolled pain   Complete by:  As directed    Call MD for:  temperature >100.4   Complete by:  As directed    Diet - low sodium heart healthy   Complete by:  As directed    Increase activity slowly   Complete by:  As directed      Signed: Colbert Ewing, MD 02/18/2017, 10:56 AM   Pager: Mamie Nick [phone]

## 2017-02-18 NOTE — Discharge Instructions (Addendum)
[name], [name] were admitted with a fall. Fortunately, the imaging studies showed no evidence of a fracture or bleed in your head.  You have a follow up appointment with our clinic downstairs on Monday 2/11 at 10:15am. We have also helped you get Home Health PT.  We drew some lab work to look into why your red blood cell counts are low. We are starting you on an iron pill. - Please take 325mg  ferrous sulfate once a day with breakfast. - Please also come to clinic where we can follow up on the rest of those labs.  Your kidney function was a little worse when you were admitted. It was getting better when you were discharged home, however please do NOT take torsemide on Saturday. Please RESTART taking torsemide on Sunday 2/10. Then you will come to clinic on Monday where we can check your kidney function.

## 2017-02-18 NOTE — Progress Notes (Addendum)
Subjective:  [name] was seen reclining in bed. Wife at bedside assisting him with breakfast. Patient denies any complaints. Denies headaches, blurry vision, chest pain, or palpitations. Complaining of some of abdominal pain. Does not recall events from yesterday.  Objective:  Vital signs in last 24 hours: Vitals:   02/17/17 2058 02/17/17 2100 02/18/17 0620 02/18/17 0819  BP: (!) 151/103 (!) 160/85 (!) 142/90 (!) 147/90  Pulse: 64 62 62 60  Resp: 18  18   Temp: 97.8 F (36.6 C)  98 F (36.7 C)   TempSrc: Oral  Oral   SpO2: 96%  95%   Weight:      Height:       GEN: Elderly. Alert. Oriented to self (not oriented to wife, year, or location) No acute distress. HENT: [place]/AT. Moist mucus membranes. No visible lesions. No evidence of open wound, erythema, or swelling on forehead EYES: PERRL. Sclera non-icteric. Conjunctiva clear. RESP: Clear to auscultation bilaterally. No wheezes, rales, or rhonchi. No increased work of breathing. CV: Normal rate and regular rhythm. Systolic murmur best heard at RUSB. No LE edema. SKIN: Capillary refill <2sec. Poor skin turgor. ABD: Soft. Non-tender. Non-distended. Normoactive bowel sounds. EXT: No edema. Warm and well perfused. NEURO: (Limited 2/2 AMS) Mild resting tremor in bilateral hands. Cranial nerves II-XII grossly intact. Able to lift all four extremities against gravity. Normal sensation in all 4 extremities. No apparent audiovisual hallucinations. Slow to respond PSYCH: Patient is calm and pleasant. Appropriate affect. Well-groomed; speech is appropriate, slow to respond.  Assessment/Plan:  Active Problems:   Fall  [name] is a 77yo male with PMH significant for moderate-severe dementia ([place] 15), complete heart block s/p PPM, CAD s/p stenting on aspirin and Plavix, OSA, COPD, [name], CVA, and HFpEF (LVEF 60-65%, grade 1 DD in 10/2016) who presents after what seems most consistent with unwitnessed mechanical fall. May have been  exacerbated by weakness 2/2 dehydration from decreased PO intake. Neuro exam is non-focal, although limited 2/2 AMS. CT head/C-spine without acute abnormalities.  Unwitnessed fall, likely mechanical Telemetry with 2 episodes of NSVT and PVCs, otherwise unremarkable. Patient does have PPM in place. CBC and CMP without evidence for an etiology of his fall. UA negative. No other infectious s/s. CXR without evidence of consolidation. Patient is afebrile with stable vital signs. Wife notes that he has had decreased intake recently, so he may have been more weak or lightheaded leading to a fall. Patient currently appears comfortable without evidence of trauma on his head and has no complaints. - Telemetry - s/p 1L IV NS, will d/c further fluids given hx of HFpEF - HH PT order placed for frequent falls and hx of dementia - d/c today with f/u in clinic  Chronic normocytic anemia Hb 8.7, MCV 79.7. Will order iron panel, ferritin, as well as kappa/lambda light chains and IFE to evaluate for MDS. Iron low at 30, saturation ratio low at 8, ferritin low-normal at 59. Will initiate oral iron. - F/u kappa/lambda light chains and IFE - will f/u on these results in outpatient clinic - Ferrous sulfate 325mg  qday  AKI on [name], improving Cr 2.53 on admission -> 1.85 this morning s/p IV NS (baseline ~1.4-1.6). - F/u BMET in clinic - Avoid nephrotoxic agents  OSA Non-compliant with mask at home. - CPAP  Complete heart block, s/p PPM CAD s/p stents HFpEF (LVEF 60-65%, grade 1 DD in 10/2016) No chest pain, no shortness of breath. Hemodynamically stable. - Continue aspirin and plavix -  Continue home metoprolol 50mg  daily - Hold home torsemide 20mg  daily x2 d on discharge until follow up 2/2 AKI. Will restart torsemide on Sunday 2/10  Dementia ([place] 15) Hx of CVA Follows with Neurology. - Delirium precautions  COPD Home regimen includes albuterol and bevespi. No wheezing on exam, saturating well on  RA. Not in acute exacerbation. - Brovana nebulizer BID - albuterol nebulizer q6h PRN  Dispo: Anticipated discharge in approximately 0-1 day(s).  Colbert Ewing, MD 02/18/2017, 10:32 AM Pager: Mamie Nick [phone]

## 2017-02-18 NOTE — Progress Notes (Signed)
Nsg Discharge Note  Admit Date:  02/17/2017 Discharge date: 02/18/2017   Ziere Docken to be D/C'd Home per MD order.  AVS completed.  Copy for chart, and copy for patient signed, and dated. Patient/caregiver able to verbalize understanding.  Discharge Medication: Allergies as of 02/18/2017      Reactions   Novocain [procaine] Other (See Comments)   Hallucinations   Rosuvastatin Calcium Other (See Comments)   Whole body ache/pain   Statins Other (See Comments)   Whole body ache/pain      Medication List    STOP taking these medications   torsemide 20 MG tablet Commonly known as:  DEMADEX     TAKE these medications   acetaminophen 500 MG tablet Commonly known as:  TYLENOL Take 500 mg by mouth every 6 (six) hours as needed for headache.   aspirin EC 81 MG tablet Take 81 mg by mouth daily.   clopidogrel 75 MG tablet Commonly known as:  PLAVIX Take 1 tablet (75 mg total) by mouth daily. What changed:  when to take this   escitalopram 10 MG tablet Commonly known as:  LEXAPRO Take 1 tablet (10 mg total) at bedtime by mouth.   ferrous sulfate 325 (65 FE) MG tablet Take 1 tablet (325 mg total) by mouth daily with breakfast. Start taking on:  02/19/2017   glipiZIDE 5 MG tablet Commonly known as:  GLUCOTROL Take 1 tablet (5 mg total) by mouth daily before breakfast.   Glycopyrrolate-Formoterol 9-4.8 MCG/ACT Aero Commonly known as:  BEVESPI AEROSPHERE Inhale 2 puffs into the lungs 2 (two) times daily. What changed:    when to take this  reasons to take this   levothyroxine 25 MCG tablet Commonly known as:  SYNTHROID, LEVOTHROID Take 25 mcg by mouth daily before breakfast.   Melatonin 3 MG Tabs Take 3 mg by mouth daily.   memantine 5 MG tablet Commonly known as:  NAMENDA Take 5 mg by mouth 2 (two) times daily.   metoprolol succinate 25 MG 24 hr tablet Commonly known as:  TOPROL-XL Take 2 tablets (50 mg total) by mouth daily. Take with or immediately following a  meal.   modafinil 100 MG tablet Commonly known as:  PROVIGIL Take 100 mg by mouth at bedtime.   multivitamin with minerals Tabs tablet Take 1 tablet by mouth daily.   nitroGLYCERIN 0.4 MG SL tablet Commonly known as:  NITROSTAT Place 0.4 mg under the tongue daily as needed for chest pain.   PROAIR HFA 108 (90 Base) MCG/ACT inhaler Generic drug:  albuterol Inhale 1-2 puffs into the lungs every 4 (four) hours as needed for shortness of breath.   ranitidine 150 MG tablet Commonly known as:  ZANTAC Take 1 tablet (150 mg total) by mouth 2 (two) times daily.   VITAMIN B-12 PO Place 1 mL under the tongue at bedtime.       Discharge Assessment: Vitals:   02/18/17 0620 02/18/17 0819  BP: (!) 142/90 (!) 147/90  Pulse: 62 60  Resp: 18   Temp: 98 F (36.7 C)   SpO2: 95%    Skin clean, dry and intact without evidence of skin break down, no evidence of skin tears noted. IV catheter discontinued intact. Site without signs and symptoms of complications - no redness or edema noted at insertion site, patient denies c/o pain - only slight tenderness at site.  Dressing with slight pressure applied.  D/c Instructions-Education: Discharge instructions given to patient/family with verbalized understanding. D/c education completed with  patient and pastor including follow up instructions, medication list, d/c activities limitations if indicated, with other d/c instructions as indicated by MD - patient able to verbalize understanding, all questions fully answered. Patient instructed to return to ED, call 911, or call MD for any changes in condition.  Patient escorted via [place], and D/C home via private auto.  Taelyn Nemes Margaretha Sheffield, RN 02/18/2017 12:21 PM

## 2017-02-18 NOTE — Care Management Obs Status (Signed)
[place] NOTIFICATION   Patient Details  Name: [name] MRN: [id number] Date of Birth: 11/08/1940   Medicare Observation Status Notification Given:  Yes    Sharin Mons, RN 02/18/2017, 12:41 PM

## 2017-02-21 ENCOUNTER — Other Ambulatory Visit: Payer: Self-pay

## 2017-02-21 ENCOUNTER — Ambulatory Visit (INDEPENDENT_AMBULATORY_CARE_PROVIDER_SITE_OTHER): Payer: Medicare HMO | Admitting: Internal Medicine

## 2017-02-21 VITALS — BP 103/72 | HR 66 | Temp 97.9°F | Ht 72.0 in | Wt 211.4 lb

## 2017-02-21 DIAGNOSIS — D539 Nutritional anemia, unspecified: Secondary | ICD-10-CM

## 2017-02-21 DIAGNOSIS — D509 Iron deficiency anemia, unspecified: Secondary | ICD-10-CM

## 2017-02-21 DIAGNOSIS — F0280 Dementia in other diseases classified elsewhere without behavioral disturbance: Secondary | ICD-10-CM | POA: Diagnosis not present

## 2017-02-21 DIAGNOSIS — Z9181 History of falling: Secondary | ICD-10-CM

## 2017-02-21 DIAGNOSIS — G309 Alzheimer's disease, unspecified: Secondary | ICD-10-CM

## 2017-02-21 DIAGNOSIS — I10 Essential (primary) hypertension: Secondary | ICD-10-CM

## 2017-02-21 DIAGNOSIS — D649 Anemia, unspecified: Secondary | ICD-10-CM | POA: Diagnosis not present

## 2017-02-21 DIAGNOSIS — N179 Acute kidney failure, unspecified: Secondary | ICD-10-CM | POA: Diagnosis not present

## 2017-02-21 DIAGNOSIS — J449 Chronic obstructive pulmonary disease, unspecified: Secondary | ICD-10-CM | POA: Diagnosis not present

## 2017-02-21 DIAGNOSIS — W19XXXD Unspecified fall, subsequent encounter: Secondary | ICD-10-CM

## 2017-02-21 DIAGNOSIS — R269 Unspecified abnormalities of gait and mobility: Secondary | ICD-10-CM

## 2017-02-21 LAB — IMMUNOFIXATION ELECTROPHORESIS
IGA: 327 mg/dL (ref 61–437)
IGG (IMMUNOGLOBIN G), SERUM: 1340 mg/dL (ref 700–1600)
IgM (Immunoglobulin M), Srm: 104 mg/dL (ref 15–143)
TOTAL PROTEIN ELP: 6.8 g/dL (ref 6.0–8.5)

## 2017-02-21 LAB — KAPPA/LAMBDA LIGHT CHAINS
Kappa free light chain: 79.8 mg/L — ABNORMAL HIGH (ref 3.3–19.4)
Kappa, lambda light chain ratio: 2.15 — ABNORMAL HIGH (ref 0.26–1.65)
Lambda free light chains: 37.2 mg/L — ABNORMAL HIGH (ref 5.7–26.3)

## 2017-02-21 NOTE — Progress Notes (Addendum)
   CC: Hospital follow-up for fall  HPI:  Mr.Javarus Trainer is a 77 y.o. with Alzheimer's dementia, essential hypertension, copd who presents for hospital follow up regarding fall. Please see problem based charting for evaluation, assessment, and plan.   Past Medical History:  Diagnosis Date  . Anxiety   . Arthritis    "knees, back, shoulders" (11/26/2016)  . CAD (coronary artery disease)    a. remote stenting >20 years ago. b. PTCA unknown vessel ~2013. c. 10/2015: s/p rotational atherectomy/DES to distal RCA and RPDA c/b embolic CVA  . CHB (complete heart block) ([place]) 02/2015   [name] 03/03/2015  . Chronic diastolic CHF (congestive heart failure) ([place])   . Chronic shoulder pain    "both shoulders" (11/26/2016)  . [name] (chronic kidney disease), stage III ([place])   . Confusion 12/2016  . COPD (chronic obstructive pulmonary disease) ([place])   . Depression   . Diabetes mellitus without complication ([place])    TYPE 2  . Dilated aortic root ([place])    a. mild by echo 09/2015.  Marland Kitchen GERD (gastroesophageal reflux disease)   . Headache    "weekly" (11/26/2016)  . Heart attack [facility]) 'many years ago'  . High cholesterol   . Hypertension   . Hypothyroidism   . OSA (obstructive sleep apnea)    "suppose to have a mask; they haven't got one adjusted for him yet" (11/26/2016)  . Presence of permanent cardiac pacemaker   . 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 Skin cancer of face    "had it cut off"  . Statin intolerance   . Stroke [facility]) 10/2015   "mini stroke; been having memory issues & confusion ever since" (11/26/2016)  . Symptomatic bradycardia    a. syncope/sinus arrest and bradycardia s/p St. Jude PPM 02/2015 with lead revision 03/2015.   Review of Systems:  Denies any nausea, lightheadedness, dizziness   Physical Exam:  Vitals:   02/21/17 1039  BP: 103/72  Pulse: 66  Temp: 97.9 F (36.6 C)  TempSrc: Oral  SpO2: 100%  Weight: 211 lb 6.4 oz (95.9 kg)  Height: 6' (1.829 m)   Physical Exam    Constitutional: He appears well-developed and well-nourished. No distress.  HENT:  Head: Normocephalic and atraumatic.  Eyes: Conjunctivae are normal.  Cardiovascular: Normal rate, regular rhythm, normal heart sounds and intact distal pulses.  Respiratory: Breath sounds normal. No respiratory distress. He has no wheezes.  GI: Soft. Bowel sounds are normal. He exhibits no distension. There is no tenderness.  Musculoskeletal: He exhibits no edema.  Neurological: He is alert.  Vibration sensation intact in bilateral feet. However, distal right foot has diminished vibration sensation   Skin: He is not diaphoretic. No erythema.  Psychiatric: He has a normal mood and affect. His behavior is normal. Judgment and thought content normal.    Assessment & Plan:   See Encounters Tab for problem based charting.  Patient discussed with Dr. Rebeca Alert

## 2017-02-21 NOTE — Patient Instructions (Addendum)
It was a pleasure to see you today Ms. Burling. Please make the following changes:  -Please continue taking all your medication -Please follow with physical therapy  If you have any questions or concerns, please call our clinic at [phone] between 9am-5pm and after hours call [phone] and ask for the internal medicine resident on call. If you feel you are having a medical emergency please call 911.   Thank you, we look forward to help you remain healthy!  Lars Mage, MD Internal Medicine PGY1  FOLLOW-UP INSTRUCTIONS When: 3 months For: Alzheimers Dementia What to bring: Medication

## 2017-02-21 NOTE — Assessment & Plan Note (Signed)
Assessment Patient had an AKI during the admission and therefore torsemide 20 mg daily was held and instructed to start on 02/20/2017.  The patient was noted to have a creatinine level of 2.53 on admission and his baseline is usually between 1.4-1.6.  On discharge the patient's creatinine was 1.85.  Plan -BMP ordered

## 2017-02-21 NOTE — Assessment & Plan Note (Signed)
Assessment The patient was admitted from 02/17/2017 02/18/2017 post fall.  He did not have any trauma, fractures, acute changes noted on CT imaging of brain, arrhythmias, sign of infection, electrolyte abnormality that could explain the fall.    The his wife notes that the patient fell again this morning but the patient does not note any acute trauma and states that he not in any acute pain.  Signs of numbness, tingling, burning sensation in his extremities.  Vibration sensation was intact in bilateral feet.  Plan -We will check BMP -Vitamin b12 ordered  -Home health order for physical therapy and nursing services ordered

## 2017-02-21 NOTE — Assessment & Plan Note (Signed)
Assessment The patient's last CBC showed an MCV of 79.7 and globin of 8.7, hematocrit of 29.4. He was started on ferrous sulfate 325 mg daily. IFE and kappa/ lambda light chain which were ordered on discharge but are still pending.  Plan -Pending IFE and kappa/lambda light chain

## 2017-02-22 ENCOUNTER — Other Ambulatory Visit: Payer: Self-pay | Admitting: Internal Medicine

## 2017-02-22 DIAGNOSIS — D509 Iron deficiency anemia, unspecified: Secondary | ICD-10-CM

## 2017-02-22 LAB — VITAMIN B12: Vitamin B-12: 723 pg/mL (ref 232–1245)

## 2017-02-22 LAB — BMP8+ANION GAP
Anion Gap: 16 mmol/L (ref 10.0–18.0)
BUN/Creatinine Ratio: 10 (ref 10–24)
BUN: 20 mg/dL (ref 8–27)
CO2: 24 mmol/L (ref 20–29)
Calcium: 9.5 mg/dL (ref 8.6–10.2)
Chloride: 105 mmol/L (ref 96–106)
Creatinine, Ser: 2.06 mg/dL — ABNORMAL HIGH (ref 0.76–1.27)
GFR calc Af Amer: 35 mL/min/{1.73_m2} — ABNORMAL LOW (ref >59)
GFR calc non Af Amer: 30 mL/min/{1.73_m2} — ABNORMAL LOW (ref >59)
Glucose: 102 mg/dL — ABNORMAL HIGH (ref 65–99)
Potassium: 4.3 mmol/L (ref 3.5–5.2)
Sodium: 145 mmol/L — ABNORMAL HIGH (ref 134–144)

## 2017-02-22 NOTE — Progress Notes (Signed)
The patient has normal serum immunoglobulins without monoclonal protein on IFE. There was elevated kappa/lambda light chain ratio (2.15) which may be an artifact. In order to verify will do 24 hour urine total protein, IFE, & creatinine clearance.

## 2017-02-22 NOTE — Progress Notes (Signed)
Internal Medicine Clinic Attending  Case discussed with Dr. Chundi  at the time of the visit.  We reviewed the resident's history and exam and pertinent patient test results.  I agree with the assessment, diagnosis, and plan of care documented in the resident's note.  [place] N Raines, MD   

## 2017-02-23 ENCOUNTER — Telehealth: Payer: Self-pay | Admitting: Adult Health

## 2017-02-23 DIAGNOSIS — R2689 Other abnormalities of gait and mobility: Secondary | ICD-10-CM | POA: Diagnosis not present

## 2017-02-23 DIAGNOSIS — N183 Chronic kidney disease, stage 3 (moderate): Secondary | ICD-10-CM | POA: Diagnosis not present

## 2017-02-23 DIAGNOSIS — M150 Primary generalized (osteo)arthritis: Secondary | ICD-10-CM | POA: Diagnosis not present

## 2017-02-23 DIAGNOSIS — I130 Hypertensive heart and chronic kidney disease with heart failure and stage 1 through stage 4 chronic kidney disease, or unspecified chronic kidney disease: Secondary | ICD-10-CM | POA: Diagnosis not present

## 2017-02-23 DIAGNOSIS — G309 Alzheimer's disease, unspecified: Secondary | ICD-10-CM | POA: Diagnosis not present

## 2017-02-23 DIAGNOSIS — F0280 Dementia in other diseases classified elsewhere without behavioral disturbance: Secondary | ICD-10-CM | POA: Diagnosis not present

## 2017-02-23 DIAGNOSIS — J449 Chronic obstructive pulmonary disease, unspecified: Secondary | ICD-10-CM | POA: Diagnosis not present

## 2017-02-23 DIAGNOSIS — I2510 Atherosclerotic heart disease of native coronary artery without angina pectoris: Secondary | ICD-10-CM | POA: Diagnosis not present

## 2017-02-23 DIAGNOSIS — I5032 Chronic diastolic (congestive) heart failure: Secondary | ICD-10-CM | POA: Diagnosis not present

## 2017-02-23 MED ORDER — RANITIDINE HCL 150 MG PO TABS: 150.0000 mg | ORAL_TABLET | Freq: Two times a day (BID) | ORAL | 1 refills | Status: AC

## 2017-02-23 NOTE — Addendum Note (Signed)
Addended by: Truddie Crumble on: 02/23/2017 09:10 AM   Modules accepted: Orders

## 2017-02-23 NOTE — Telephone Encounter (Signed)
Former [facility] patient last seen 11.30.18 for COPD with recs to follow up in 3 months No pending appts Received faxed refill request from [facility] in [place] for Ranitidine 150mg  BID #[id number]  Refill sent with 1 additional and note that pt is due for appt Nothing further needed; will sign off

## 2017-03-02 ENCOUNTER — Encounter (HOSPITAL_COMMUNITY): Payer: Self-pay | Admitting: Emergency Medicine

## 2017-03-02 ENCOUNTER — Other Ambulatory Visit: Payer: Self-pay

## 2017-03-02 ENCOUNTER — Observation Stay (HOSPITAL_COMMUNITY)
Admission: EM | Admit: 2017-03-02 | Discharge: 2017-03-03 | Disposition: A | Discharge status: Home / Self Care | Payer: Medicare HMO | Attending: Internal Medicine | Admitting: Internal Medicine

## 2017-03-02 ENCOUNTER — Emergency Department (HOSPITAL_COMMUNITY): Payer: Medicare HMO

## 2017-03-02 DIAGNOSIS — R160 Hepatomegaly, not elsewhere classified: Secondary | ICD-10-CM

## 2017-03-02 DIAGNOSIS — I2510 Atherosclerotic heart disease of native coronary artery without angina pectoris: Secondary | ICD-10-CM | POA: Diagnosis not present

## 2017-03-02 DIAGNOSIS — N189 Chronic kidney disease, unspecified: Secondary | ICD-10-CM

## 2017-03-02 DIAGNOSIS — F419 Anxiety disorder, unspecified: Secondary | ICD-10-CM | POA: Diagnosis not present

## 2017-03-02 DIAGNOSIS — J449 Chronic obstructive pulmonary disease, unspecified: Secondary | ICD-10-CM | POA: Diagnosis not present

## 2017-03-02 DIAGNOSIS — I252 Old myocardial infarction: Secondary | ICD-10-CM | POA: Insufficient documentation

## 2017-03-02 DIAGNOSIS — E7800 Pure hypercholesterolemia, unspecified: Secondary | ICD-10-CM | POA: Insufficient documentation

## 2017-03-02 DIAGNOSIS — E860 Dehydration: Secondary | ICD-10-CM

## 2017-03-02 DIAGNOSIS — Z950 Presence of cardiac pacemaker: Secondary | ICD-10-CM | POA: Insufficient documentation

## 2017-03-02 DIAGNOSIS — N182 Chronic kidney disease, stage 2 (mild): Secondary | ICD-10-CM | POA: Insufficient documentation

## 2017-03-02 DIAGNOSIS — I5032 Chronic diastolic (congestive) heart failure: Secondary | ICD-10-CM | POA: Diagnosis not present

## 2017-03-02 DIAGNOSIS — Z8679 Personal history of other diseases of the circulatory system: Secondary | ICD-10-CM

## 2017-03-02 DIAGNOSIS — K639 Disease of intestine, unspecified: Secondary | ICD-10-CM | POA: Insufficient documentation

## 2017-03-02 DIAGNOSIS — F329 Major depressive disorder, single episode, unspecified: Secondary | ICD-10-CM | POA: Insufficient documentation

## 2017-03-02 DIAGNOSIS — I130 Hypertensive heart and chronic kidney disease with heart failure and stage 1 through stage 4 chronic kidney disease, or unspecified chronic kidney disease: Secondary | ICD-10-CM | POA: Insufficient documentation

## 2017-03-02 DIAGNOSIS — M479 Spondylosis, unspecified: Secondary | ICD-10-CM | POA: Diagnosis not present

## 2017-03-02 DIAGNOSIS — Z884 Allergy status to anesthetic agent status: Secondary | ICD-10-CM

## 2017-03-02 DIAGNOSIS — R1084 Generalized abdominal pain: Secondary | ICD-10-CM | POA: Diagnosis not present

## 2017-03-02 DIAGNOSIS — Z7902 Long term (current) use of antithrombotics/antiplatelets: Secondary | ICD-10-CM | POA: Insufficient documentation

## 2017-03-02 DIAGNOSIS — G309 Alzheimer's disease, unspecified: Secondary | ICD-10-CM | POA: Insufficient documentation

## 2017-03-02 DIAGNOSIS — Z8249 Family history of ischemic heart disease and other diseases of the circulatory system: Secondary | ICD-10-CM | POA: Insufficient documentation

## 2017-03-02 DIAGNOSIS — Z888 Allergy status to other drugs, medicaments and biological substances status: Secondary | ICD-10-CM

## 2017-03-02 DIAGNOSIS — N183 Chronic kidney disease, stage 3 (moderate): Secondary | ICD-10-CM | POA: Diagnosis not present

## 2017-03-02 DIAGNOSIS — D509 Iron deficiency anemia, unspecified: Secondary | ICD-10-CM

## 2017-03-02 DIAGNOSIS — K6389 Other specified diseases of intestine: Secondary | ICD-10-CM | POA: Diagnosis not present

## 2017-03-02 DIAGNOSIS — N179 Acute kidney failure, unspecified: Secondary | ICD-10-CM | POA: Insufficient documentation

## 2017-03-02 DIAGNOSIS — Z87891 Personal history of nicotine dependence: Secondary | ICD-10-CM | POA: Insufficient documentation

## 2017-03-02 DIAGNOSIS — D631 Anemia in chronic kidney disease: Secondary | ICD-10-CM | POA: Diagnosis not present

## 2017-03-02 DIAGNOSIS — C189 Malignant neoplasm of colon, unspecified: Principal | ICD-10-CM | POA: Insufficient documentation

## 2017-03-02 DIAGNOSIS — M19012 Primary osteoarthritis, left shoulder: Secondary | ICD-10-CM | POA: Diagnosis not present

## 2017-03-02 DIAGNOSIS — F0280 Dementia in other diseases classified elsewhere without behavioral disturbance: Secondary | ICD-10-CM | POA: Diagnosis not present

## 2017-03-02 DIAGNOSIS — Z7982 Long term (current) use of aspirin: Secondary | ICD-10-CM

## 2017-03-02 DIAGNOSIS — Z79899 Other long term (current) drug therapy: Secondary | ICD-10-CM | POA: Insufficient documentation

## 2017-03-02 DIAGNOSIS — G4733 Obstructive sleep apnea (adult) (pediatric): Secondary | ICD-10-CM | POA: Diagnosis not present

## 2017-03-02 DIAGNOSIS — I5030 Unspecified diastolic (congestive) heart failure: Secondary | ICD-10-CM | POA: Diagnosis present

## 2017-03-02 DIAGNOSIS — R634 Abnormal weight loss: Secondary | ICD-10-CM | POA: Diagnosis not present

## 2017-03-02 DIAGNOSIS — R1031 Right lower quadrant pain: Secondary | ICD-10-CM | POA: Diagnosis not present

## 2017-03-02 DIAGNOSIS — I442 Atrioventricular block, complete: Secondary | ICD-10-CM | POA: Diagnosis not present

## 2017-03-02 DIAGNOSIS — K219 Gastro-esophageal reflux disease without esophagitis: Secondary | ICD-10-CM | POA: Insufficient documentation

## 2017-03-02 DIAGNOSIS — G300 Alzheimer's disease with early onset: Secondary | ICD-10-CM

## 2017-03-02 DIAGNOSIS — Z85828 Personal history of other malignant neoplasm of skin: Secondary | ICD-10-CM | POA: Insufficient documentation

## 2017-03-02 DIAGNOSIS — E1122 Type 2 diabetes mellitus with diabetic chronic kidney disease: Secondary | ICD-10-CM | POA: Diagnosis not present

## 2017-03-02 DIAGNOSIS — Z8673 Personal history of transient ischemic attack (TIA), and cerebral infarction without residual deficits: Secondary | ICD-10-CM | POA: Insufficient documentation

## 2017-03-02 DIAGNOSIS — Z955 Presence of coronary angioplasty implant and graft: Secondary | ICD-10-CM | POA: Diagnosis not present

## 2017-03-02 DIAGNOSIS — R932 Abnormal findings on diagnostic imaging of liver and biliary tract: Secondary | ICD-10-CM

## 2017-03-02 DIAGNOSIS — C787 Secondary malignant neoplasm of liver and intrahepatic bile duct: Secondary | ICD-10-CM | POA: Diagnosis not present

## 2017-03-02 DIAGNOSIS — E039 Hypothyroidism, unspecified: Secondary | ICD-10-CM | POA: Insufficient documentation

## 2017-03-02 DIAGNOSIS — M170 Bilateral primary osteoarthritis of knee: Secondary | ICD-10-CM | POA: Diagnosis not present

## 2017-03-02 DIAGNOSIS — F0390 Unspecified dementia without behavioral disturbance: Secondary | ICD-10-CM

## 2017-03-02 DIAGNOSIS — M19011 Primary osteoarthritis, right shoulder: Secondary | ICD-10-CM | POA: Diagnosis not present

## 2017-03-02 DIAGNOSIS — R102 Pelvic and perineal pain: Secondary | ICD-10-CM | POA: Diagnosis not present

## 2017-03-02 DIAGNOSIS — Z7984 Long term (current) use of oral hypoglycemic drugs: Secondary | ICD-10-CM | POA: Insufficient documentation

## 2017-03-02 DIAGNOSIS — Z79890 Hormone replacement therapy (postmenopausal): Secondary | ICD-10-CM

## 2017-03-02 LAB — URINALYSIS, ROUTINE W REFLEX MICROSCOPIC
BILIRUBIN URINE: NEGATIVE
GLUCOSE, UA: NEGATIVE mg/dL
HGB URINE DIPSTICK: NEGATIVE
KETONES UR: NEGATIVE mg/dL
LEUKOCYTES UA: NEGATIVE
Nitrite: NEGATIVE
PH: 5 (ref 5.0–8.0)
PROTEIN: NEGATIVE mg/dL
Specific Gravity, Urine: 1.011 (ref 1.005–1.030)

## 2017-03-02 LAB — GLUCOSE, CAPILLARY
Glucose-Capillary: 81 mg/dL (ref 65–99)
Glucose-Capillary: 107 mg/dL — ABNORMAL HIGH (ref 65–99)

## 2017-03-02 LAB — COMPREHENSIVE METABOLIC PANEL
ALBUMIN: 2.9 g/dL — AB (ref 3.5–5.0)
ALK PHOS: 192 U/L — AB (ref 38–126)
ALT: 20 U/L (ref 17–63)
ANION GAP: 12 (ref 5–15)
AST: 43 U/L — ABNORMAL HIGH (ref 15–41)
BILIRUBIN TOTAL: 0.6 mg/dL (ref 0.3–1.2)
BUN: 20 mg/dL (ref 6–20)
CALCIUM: 9.1 mg/dL (ref 8.9–10.3)
CO2: 26 mmol/L (ref 22–32)
CREATININE: 2.35 mg/dL — AB (ref 0.61–1.24)
Chloride: 99 mmol/L — ABNORMAL LOW (ref 101–111)
GFR calc Af Amer: 29 mL/min — ABNORMAL LOW (ref >60)
GFR calc non Af Amer: 25 mL/min — ABNORMAL LOW (ref >60)
GLUCOSE: 92 mg/dL (ref 65–99)
Potassium: 3.4 mmol/L — ABNORMAL LOW (ref 3.5–5.1)
SODIUM: 137 mmol/L (ref 135–145)
TOTAL PROTEIN: 6.6 g/dL (ref 6.5–8.1)

## 2017-03-02 LAB — CBC WITH DIFFERENTIAL/PLATELET
BASOS PCT: 0 %
Basophils Absolute: 0 10*3/uL (ref 0.0–0.1)
Eosinophils Absolute: 0.4 10*3/uL (ref 0.0–0.7)
Eosinophils Relative: 6 %
HEMATOCRIT: 27.9 % — AB (ref 39.0–52.0)
HEMOGLOBIN: 8.4 g/dL — AB (ref 13.0–17.0)
LYMPHS ABS: 1.9 10*3/uL (ref 0.7–4.0)
Lymphocytes Relative: 26 %
MCH: 23.7 pg — ABNORMAL LOW (ref 26.0–34.0)
MCHC: 30.1 g/dL (ref 30.0–36.0)
MCV: 78.6 fL (ref 78.0–100.0)
MONOS PCT: 9 %
Monocytes Absolute: 0.7 10*3/uL (ref 0.1–1.0)
NEUTROS ABS: 4.5 10*3/uL (ref 1.7–7.7)
Neutrophils Relative %: 59 %
Platelets: 259 10*3/uL (ref 150–400)
RBC: 3.55 MIL/uL — AB (ref 4.22–5.81)
RDW: 18.3 % — ABNORMAL HIGH (ref 11.5–15.5)
WBC: 7.5 10*3/uL (ref 4.0–10.5)

## 2017-03-02 MED ORDER — ESCITALOPRAM OXALATE 10 MG PO TABS
10.0000 mg | ORAL_TABLET | Freq: Every day | ORAL | Status: DC
  Administered 2017-03-02: 10 mg via ORAL
  Filled 2017-03-02: qty 1

## 2017-03-02 MED ORDER — ACETAMINOPHEN 500 MG PO TABS
500.0000 mg | ORAL_TABLET | Freq: Four times a day (QID) | ORAL | Status: DC | PRN
  Administered 2017-03-02 – 2017-03-03 (×2): 500 mg via ORAL
  Filled 2017-03-02 (×2): qty 1

## 2017-03-02 MED ORDER — METOPROLOL SUCCINATE ER 50 MG PO TB24
50.0000 mg | ORAL_TABLET | Freq: Every day | ORAL | Status: DC
  Administered 2017-03-02: 50 mg via ORAL
  Filled 2017-03-02: qty 1

## 2017-03-02 MED ORDER — CLOPIDOGREL BISULFATE 75 MG PO TABS
75.0000 mg | ORAL_TABLET | Freq: Every day | ORAL | Status: DC
  Administered 2017-03-02: 75 mg via ORAL
  Filled 2017-03-02: qty 1

## 2017-03-02 MED ORDER — SODIUM CHLORIDE 0.9 % IV SOLN
INTRAVENOUS | Status: DC
  Administered 2017-03-02: via INTRAVENOUS

## 2017-03-02 MED ORDER — SODIUM CHLORIDE 0.9 % IV BOLUS (SEPSIS)
500.0000 mL | Freq: Once | INTRAVENOUS | Status: AC
  Administered 2017-03-02: 500 mL via INTRAVENOUS

## 2017-03-02 MED ORDER — MODAFINIL 100 MG PO TABS: 100.0000 mg | ORAL_TABLET | Freq: Every day | ORAL | Status: DC

## 2017-03-02 MED ORDER — LORAZEPAM 2 MG/ML IJ SOLN
0.5000 mg | Freq: Once | INTRAMUSCULAR | Status: AC
  Administered 2017-03-02: 0.5 mg via INTRAVENOUS
  Filled 2017-03-02: qty 1

## 2017-03-02 MED ORDER — FAMOTIDINE 20 MG PO TABS
10.0000 mg | ORAL_TABLET | Freq: Two times a day (BID) | ORAL | Status: DC
  Administered 2017-03-02 – 2017-03-03 (×2): 10 mg via ORAL
  Filled 2017-03-02 (×2): qty 1

## 2017-03-02 MED ORDER — LEVOTHYROXINE SODIUM 25 MCG PO TABS
25.0000 ug | ORAL_TABLET | Freq: Every day | ORAL | Status: DC
  Administered 2017-03-03: 25 ug via ORAL
  Filled 2017-03-02: qty 1

## 2017-03-02 MED ORDER — ASPIRIN EC 81 MG PO TBEC
81.0000 mg | DELAYED_RELEASE_TABLET | Freq: Every day | ORAL | Status: DC
  Administered 2017-03-02 – 2017-03-03 (×2): 81 mg via ORAL
  Filled 2017-03-02 (×2): qty 1

## 2017-03-02 MED ORDER — MEMANTINE HCL 5 MG PO TABS
5.0000 mg | ORAL_TABLET | Freq: Two times a day (BID) | ORAL | Status: DC
  Administered 2017-03-03: 5 mg via ORAL
  Filled 2017-03-02 (×2): qty 1

## 2017-03-02 MED ORDER — NITROGLYCERIN 0.4 MG SL SUBL: 0.4000 mg | SUBLINGUAL_TABLET | Freq: Every day | SUBLINGUAL | Status: DC | PRN

## 2017-03-02 MED ORDER — INSULIN ASPART 100 UNIT/ML ~~LOC~~ SOLN: 0.0000 [IU] | Freq: Three times a day (TID) | SUBCUTANEOUS | Status: DC

## 2017-03-02 MED ORDER — MODAFINIL 100 MG PO TABS
100.0000 mg | ORAL_TABLET | Freq: Every morning | ORAL | Status: DC
  Administered 2017-03-03: 100 mg via ORAL
  Filled 2017-03-02: qty 1

## 2017-03-02 MED ORDER — SODIUM CHLORIDE 0.9 % IV BOLUS (SEPSIS): 1000.0000 mL | Freq: Once | INTRAVENOUS | Status: DC

## 2017-03-02 MED ORDER — MELATONIN 3 MG PO TABS
3.0000 mg | ORAL_TABLET | Freq: Every day | ORAL | Status: DC
  Administered 2017-03-02: 3 mg via ORAL
  Filled 2017-03-02: qty 1

## 2017-03-02 MED ORDER — ENOXAPARIN SODIUM 40 MG/0.4ML ~~LOC~~ SOLN
40.0000 mg | SUBCUTANEOUS | Status: DC
  Administered 2017-03-02: 40 mg via SUBCUTANEOUS
  Filled 2017-03-02: qty 0.4

## 2017-03-02 MED ORDER — SODIUM CHLORIDE 0.9 % IV SOLN
INTRAVENOUS | Status: AC
  Administered 2017-03-02: 14:00:00 via INTRAVENOUS

## 2017-03-02 NOTE — ED Provider Notes (Signed)
[facility] EMERGENCY DEPARTMENT Provider Note   CSN: [id number] Arrival date & time: 03/02/17  0350     History   Chief Complaint Chief Complaint  Patient presents with  . Abdominal Pain    HPI [name] is a 77 y.o. male hx of CAD, CHF, [name], COPD here with right lower quadrant pain.  Patient was recently admitted for acute renal failure and had extensive workup and had elevated kappa and lambda chains.  Patient's renal failure improved with IV fluids in the hospital and was discharged.  Since last night, patient had acute onset right lower quadrant pain woke up from it. Hasn't urinate since yesterday.   The history is provided by the patient.    Past Medical History:  Diagnosis Date  . Anxiety   . Arthritis    "knees, back, shoulders" (11/26/2016)  . CAD (coronary artery disease)    a. remote stenting >20 years ago. b. PTCA unknown vessel ~2013. c. 10/2015: s/p rotational atherectomy/DES to distal RCA and RPDA c/b embolic CVA  . CHB (complete heart block) ([place]) 02/2015   [name] 03/03/2015  . Chronic diastolic CHF (congestive heart failure) ([place])   . Chronic shoulder pain    "both shoulders" (11/26/2016)  . [name] (chronic kidney disease), stage III ([place])   . Confusion 12/2016  . COPD (chronic obstructive pulmonary disease) ([place])   . Depression   . Diabetes mellitus without complication ([place])    TYPE 2  . Dilated aortic root ([place])    a. mild by echo 09/2015.  Marland Kitchen GERD (gastroesophageal reflux disease)   . Headache    "weekly" (11/26/2016)  . Heart attack [facility]) 'many years ago'  . High cholesterol   . Hypertension   . Hypothyroidism   . OSA (obstructive sleep apnea)    "suppose to have a mask; they haven't got one adjusted for him yet" (11/26/2016)  . Presence of permanent cardiac pacemaker   . 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 Skin cancer of face    "had it cut off"  . Statin intolerance   . Stroke [facility]) 10/2015   "mini stroke; been having memory  issues & confusion ever since" (11/26/2016)  . Symptomatic bradycardia    a. syncope/sinus arrest and bradycardia s/p St[employer] Jude PPM 02/2015 with lead revision 03/2015.    Patient Active Problem List   Diagnosis Date Noted  . Renal failure 03/02/2017  . Anemia 02/21/2017  . Dehydration   . Acute kidney injury ([place])   . Cardiac pacemaker in situ   . Fall 02/17/2017  . Healthcare maintenance 01/21/2017  . Encephalopathy 01/16/2017  . Confusion   . Type 2 diabetes mellitus without complication, without long-term current use of insulin ([place]) 11/29/2016  . SVT (supraventricular tachycardia) ([place]) 11/26/2016  . Alpha-1-antitrypsin deficiency carrier ([place]) 11/25/2016  . Restrictive lung disease 11/08/2016  . COPD (chronic obstructive pulmonary disease) ([place]) 10/25/2016  . Pulmonary hypertension ([place]) 10/15/2016  . Shortness of breath 10/12/2016  . Gait disturbance 07/20/2016  . Edema of lower extremity 04/08/2016  . Other dietary vitamin B12 deficiency anemia 01/15/2016  . Anemia, macrocytic 12/24/2015  . Frail elderly 11/14/2015  . Chronic renal insufficiency, stage 3 (moderate) ([place]) 11/05/2015  . Hyperlipidemia   . Essential hypertension   . S/P [facility] Feb 2017 10/23/2015  . CAD-S/P remote PCI 20 yrs ago, ? 4 years ago, and s/p RCA PCI/DES 10/22/15 10/23/2015  . Cerebral thrombosis with cerebral infarction 10/23/2015  . DOE (dyspnea on exertion)   .  Accelerating angina ([place])   . Hypothyroidism 07/09/2015  . Fatigue due to depression 05/09/2015  . Moderate single current episode of major depressive disorder ([place]) 05/09/2015  . 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 Retinal detachment, tractional, right eye 03/15/2015  . Hepatic cyst 03/11/2015  . Contracture of ankle and foot joint 03/10/2015  . Dysphagia 03/10/2015  . Edema 03/10/2015  . History of ST elevation myocardial infarction (STEMI) 03/10/2015  . Low back pain 03/10/2015  . Alzheimer disease type 3  03/10/2015  . Mediastinal lymphadenopathy 03/10/2015  . Obesity 03/10/2015  . Osteoarthritis 03/10/2015  . 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 Personal history of noncompliance with medical treatment, presenting hazards to health 03/10/2015  . 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 Symptomatic bradycardia 03/04/2015  . 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 Anxiety about health 02/19/2015  . Diastolic CHF ([place]) 17/40/8144  . 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 LVH (left ventricular hypertrophy) 02/19/2015  . OSA (obstructive sleep apnea) 02/19/2015  . Rotator cuff tear, right 02/19/2015  . Abnormal CT of the chest 02/10/2015  . Non-Q wave myocardial infarction ([place]) 09/09/2011    Past Surgical History:  Procedure Laterality Date  . CARDIAC CATHETERIZATION N/A 10/17/2015   Procedure: Left Heart Cath and Coronary Angiography;  Surgeon: Jettie Booze, MD;  Location: [facility] CV LAB;  Service: Cardiovascular;  Laterality: N/A;  . CARDIAC CATHETERIZATION N/A 10/22/2015   Procedure: Coronary Stent Intervention Rotablater;  Surgeon: Jettie Booze, MD;  Location: [facility] CV LAB;  Service: Cardiovascular;  Laterality: N/A;  . CARDIAC CATHETERIZATION N/A 10/22/2015   Procedure: Left Heart Cath and Coronary Angiography;  Surgeon: Jettie Booze, MD;  Location: [facility] CV LAB;  Service: Cardiovascular;  Laterality: N/A;  . CATARACT EXTRACTION W/ INTRAOCULAR LENS  IMPLANT, BILATERAL Bilateral 2016  . CORONARY ANGIOPLASTY  2012  . CORONARY ANGIOPLASTY WITH STENT PLACEMENT  2001  . CORONARY ANGIOPLASTY WITH STENT PLACEMENT     "took 2 out and put 1 longer one in"  . EP IMPLANTABLE DEVICE N/A 03/04/2015   Procedure: Pacemaker Implant;  Surgeon: Will Meredith Leeds, MD;  Location: [facility] CV LAB;  Service: Cardiovascular;  Laterality: N/A;  . EP IMPLANTABLE DEVICE N/A 04/10/2015   Procedure: PPM Lead Revision/Repair;  Surgeon: Will  Meredith Leeds, MD;  Location: [facility] CV LAB;  Service: Cardiovascular;  Laterality: N/A;  . EYE SURGERY    . INSERT / REPLACE / REMOVE PACEMAKER    . RETINAL DETACHMENT SURGERY Bilateral    "several on each side"       Home Medications    Prior to Admission medications   Medication Sig Start Date End Date Taking? Authorizing Provider  acetaminophen (TYLENOL) 500 MG tablet Take 500 mg by mouth every 6 (six) hours as needed for headache.   Yes [provider]  albuterol (PROAIR HFA) 108 (90 Base) MCG/ACT inhaler Inhale 1-2 puffs into the lungs every 4 (four) hours as needed for shortness of breath.   Yes [provider]  aspirin EC 81 MG tablet Take 81 mg by mouth daily.    Yes [provider]  clopidogrel (PLAVIX) 75 MG tablet Take 1 tablet (75 mg total) by mouth daily. Patient taking differently: Take 75 mg by mouth at bedtime.  10/20/15  Yes Arbutus Leas, NP  Cyanocobalamin (VITAMIN B-12 PO) Place 1 mL under the tongue daily.    Yes [provider]  escitalopram (LEXAPRO) 10 MG tablet  Take 1 tablet (10 mg total) at bedtime by mouth. 11/29/16  Yes Rice, Resa Miner, MD  ferrous sulfate 325 (65 FE) MG tablet Take 1 tablet (325 mg total) by mouth daily with breakfast. 02/19/17  Yes Colbert Ewing, MD  glipiZIDE (GLUCOTROL) 5 MG tablet Take 1 tablet (5 mg total) by mouth daily before breakfast. 12/31/16 03/02/17 Yes Axel Filler, MD  Glycopyrrolate-Formoterol (BEVESPI AEROSPHERE) 9-4.8 MCG/ACT AERO Inhale 2 puffs into the lungs 2 (two) times daily. Patient taking differently: Inhale 2 puffs into the lungs daily as needed.  11/08/16  Yes Javier Glazier, MD  levothyroxine (SYNTHROID, LEVOTHROID) 25 MCG tablet Take 25 mcg by mouth daily before breakfast.   Yes [provider]  Melatonin 3 MG TABS Take 3 mg by mouth at bedtime.    Yes [provider]  memantine (NAMENDA) 5 MG tablet Take 5 mg by mouth 2 (two) times daily.  01/19/17  Yes [provider]  metoprolol succinate (TOPROL-XL) 25 MG 24 hr tablet Take 2 tablets (50 mg total) by mouth daily. Take with or immediately following a meal. Patient taking differently: Take 50 mg by mouth at bedtime. Take with or immediately following a meal. 12/14/16  Yes Colbert Ewing, MD  modafinil (PROVIGIL) 100 MG tablet Take 100 mg by mouth at bedtime. 02/01/17  Yes [provider]  Multiple Vitamin (MULTIVITAMIN WITH MINERALS) TABS tablet Take 1 tablet by mouth daily.    Yes [provider]  nitroGLYCERIN (NITROSTAT) 0.4 MG SL tablet Place 0.4 mg under the tongue daily as needed for chest pain.    Yes [provider]  ranitidine (ZANTAC) 150 MG tablet Take 1 tablet (150 mg total) by mouth 2 (two) times daily. 02/23/17  Yes Parrett, Tammy S, NP  torsemide (DEMADEX) 20 MG tablet Take 20 mg by mouth at bedtime.   Yes [provider]    Family History Family History  Problem Relation Age of Onset  . Heart disease Father   . Heart disease Mother   . Diabetes Sister   . Cancer Sister   . Diabetes Brother   . Diabetes Sister   . Lung disease Neg Hx     Social History Social History   Tobacco Use  . Smoking status: Former Smoker    Packs/day: 1.00    Years: 11.00    Pack years: 11.00    Types: Cigarettes    Start date: 09/09/1947    Last attempt to quit: 09/09/1958    Years since quitting: 58.5  . Smokeless tobacco: Never Used  . Tobacco comment: Significant second-hand exposure. Wife also smokes around him.  Substance Use Topics  . Alcohol use: Yes    Comment: 11/26/2016 ""nothing since 12/2010"  . Drug use: No     Allergies   Novocain [procaine]; Rosuvastatin calcium; and Statins   Review of Systems Review of Systems  Gastrointestinal: Positive for abdominal pain.  All other systems reviewed and are negative.    Physical Exam Updated Vital Signs BP (!) 121/91   Pulse 66   Temp 97.9 F (36.6 C) (Oral)    Resp 20   Ht 6' (1.829 m)   Wt 95.3 kg (210 lb)   SpO2 95%   BMI 28.48 kg/m   Physical Exam  Constitutional:  Chronically ill, dehydrated   HENT:  Head: Normocephalic.  MM dry   Eyes: EOM are normal. Pupils are equal, round, and reactive to light.  Cardiovascular: Normal rate, regular rhythm and normal  heart sounds.  Pulmonary/Chest: Effort normal and breath sounds normal.  Abdominal: Normal appearance and bowel sounds are normal.  Minimal periumbilical tenderness, no rebound   Neurological: He is alert.  Skin: Skin is warm.  Psychiatric: He has a normal mood and affect.  Nursing note and vitals reviewed.    ED Treatments / Results  Labs (all labs ordered are listed, but only abnormal results are displayed) Labs Reviewed  CBC WITH DIFFERENTIAL/PLATELET - Abnormal; Notable for the following components:      Result Value   RBC 3.55 (*)    Hemoglobin 8.4 (*)    HCT 27.9 (*)    MCH 23.7 (*)    RDW 18.3 (*)    All other components within normal limits  COMPREHENSIVE METABOLIC PANEL - Abnormal; Notable for the following components:   Potassium 3.4 (*)    Chloride 99 (*)    Creatinine, Ser 2.35 (*)    Albumin 2.9 (*)    AST 43 (*)    Alkaline Phosphatase 192 (*)    GFR calc non Af Amer 25 (*)    GFR calc Af Amer 29 (*)    All other components within normal limits  URINE CULTURE  URINALYSIS, ROUTINE W REFLEX MICROSCOPIC    EKG  EKG Interpretation  Date/Time:  Wednesday March 02 2017 06:56:09 EST Ventricular Rate:  60 PR Interval:    QRS Duration: 94 QT Interval:  454 QTC Calculation: 454 R Axis:   -45 Text Interpretation:  Sinus rhythm LAD, consider left anterior fascicular block Confirmed by Randal Buba, April ([id number]) on 03/02/2017 6:59:11 AM       Radiology Ct Renal Stone Study  Result Date: 03/02/2017 CLINICAL DATA:  Low pelvic pain. EXAM: CT ABDOMEN AND PELVIS WITHOUT CONTRAST TECHNIQUE: Multidetector CT imaging of the abdomen and pelvis was performed  following the standard protocol without IV contrast. COMPARISON:  None. FINDINGS: Lower chest: Unremarkable Hepatobiliary: Multiple large liver lesions are identified consistent with metastatic disease. Dominant 5 cm lesion is identified in the dome of the liver on image 7 series 3. Posterior right hepatic lesion on image 23 measures 5.2 cm. There is no evidence for gallstones, gallbladder wall thickening, or pericholecystic fluid. No intrahepatic or extrahepatic biliary dilation. Pancreas: No focal mass lesion. No dilatation of the main duct. No intraparenchymal cyst. No peripancreatic edema. Spleen: No splenomegaly. No focal mass lesion. Adrenals/Urinary Tract: No adrenal nodule or mass. Kidneys are unremarkable. No evidence for hydroureter. The urinary bladder appears normal for the degree of distention. Stomach/Bowel: Stomach is nondistended. No gastric wall thickening. No evidence of outlet obstruction. Duodenum is normally positioned as is the ligament of Treitz. No small bowel wall thickening. No small bowel dilatation. The terminal ileum is normal. The appendix is normal. Circumferential wall thickening noted in the splenic flexure (image 28 series 3) Vascular/Lymphatic: There is abdominal aortic atherosclerosis without aneurysm. There is no gastrohepatic or hepatoduodenal ligament lymphadenopathy. No intraperitoneal or retroperitoneal lymphadenopathy. No pelvic sidewall lymphadenopathy. Reproductive: The prostate gland and seminal vesicles have normal imaging features. Other: No intraperitoneal free fluid. Musculoskeletal: Bone windows reveal no worrisome lytic or sclerotic osseous lesions. IMPRESSION: 1. Bulky metastatic disease in the liver with a focal circumferential lesion in the hepatic flexure, highly suspicious for colorectal neoplasm. 2. No evidence for urinary stone disease or urinary obstruction. 3.  Aortic Atherosclerois (ICD10-170.0) Electronically Signed   By: Misty Stanley M.D.   On:  03/02/2017 11:44    Procedures Procedures (including critical care time)  Medications  Ordered in ED Medications  0.9 %  sodium chloride infusion (not administered)  sodium chloride 0.9 % bolus 500 mL (0 mLs Intravenous Stopped 03/02/17 0906)  LORazepam (ATIVAN) injection 0.5 mg (0.5 mg Intravenous Given 03/02/17 1155)     Initial Impression / Assessment and Plan / ED Course  I have reviewed the triage vital signs and the nursing notes.  Pertinent labs & imaging results that were available during my care of the patient were reviewed by me and considered in my medical decision making (see chart for details).     Srinivas Lippman is a 77 y.o. male here with abdominal pain, decreased urine output. Appears dehydrated. Recently admitted for renal failure. Will get labs, CT ab/pel non contrast, UA.   12:39 PM CT showed colon mass with possible mets to the liver. Cr 2.5, elevated compare to discharge. UA nl. Internal medicine teaching service to readmit.    Final Clinical Impressions(s) / ED Diagnoses   Final diagnoses:  Acute renal failure superimposed on chronic kidney disease, unspecified [name] stage, unspecified acute renal failure type [facility])    ED Discharge Orders    None       Drenda Freeze, MD 03/02/17 1239

## 2017-03-02 NOTE — H&P (Signed)
Date: 03/02/2017               Patient Name:  [name] MRN: [id number]  DOB: Mar 15, 1940 Age / Sex: 77 y.o., male   PCP: Lars Mage, MD         Medical Service: Internal Medicine Teaching Service         Attending Physician: Dr. Oda Kilts, MD    First Contact: Dr. Ronalee Red Pager: [phone]  Second Contact: Dr. Philipp Ovens Pager: [phone]       After Hours (After 5p/  First Contact Pager: [phone]  weekends / holidays): Second Contact Pager: [phone]   Chief Complaint: abdominal pain  History of Present Illness:  [name] is a 77yo male with PMH significant for moderate-severe dementia ([place] 15), complete heart block s/p PPM, CAD s/p RCA stent in 2017 on aspirin and Plavix, OSA (not on CPAP), COPD, [name], CVA, and HFpEF (LVEF 60-65%, grade 1 DD in 10/2016) who presents with worsened abdominal pain. Wife and niece at bedside who provides additional history.   Patient had received a dose of Ativan in the emergency room, and thus at the time of my evaluation, he was not able to participate in providing the history. His history was obtained from chart review and family.  Patient has had intermittent abdominal pain for the last few months, but it acutely worsened last night. The pain is RLQ and woke him up from sleep. Wife denies that patient has had hematochezia, melena, changes in BMs, nausea, or vomiting.   He was started on ranitidine in November 2018 for diffuse abdominal pain that was thought to be related to GERD. Per chart review, it appears he had a colonoscopy in 2017 and was planned to repeat in 10 years. He also had a CT chest in 02/2015 that showed hypodensities in the liver, likely representing cysts. Weight in 11/2016 was 233lb, which is down to 211lb in 02/2017.  He was recently admitted 2/7-2/8 for AKI and started work-up for chronic normocytic anemia. He was started on oral iron. He had normal serum Ig without monoclonal protein on IFE. He did have elevated  kappa/lambda light chain ratio, and plan was for further evaluation with 24h urine total protein, 24h creatinine clearance, and urine IFE.  ED Course: - BP 112/77, HR 60, RR 19, temp 97.9, O2 96% on RA - Hb 8.4 (baseline 8.7-10.3). WBC and platelets normal. K 3.4, Cr 2.35 (baseline 1.3-1.6). AP 192. UA negative for UTI - CT abdomen/pelvis showed bulky metastatic disease in the liver with a focal circumferential lesion in the hepatic flexure, highly suspicious for colorectal neoplasm. - He received a dose of Ativan, as well as 500cc IV NS bolus.  Meds:  Current Meds  Medication Sig  . acetaminophen (TYLENOL) 500 MG tablet Take 500 mg by mouth every 6 (six) hours as needed for headache.  . albuterol (PROAIR HFA) 108 (90 Base) MCG/ACT inhaler Inhale 1-2 puffs into the lungs every 4 (four) hours as needed for shortness of breath.  Marland Kitchen aspirin EC 81 MG tablet Take 81 mg by mouth daily.   . clopidogrel (PLAVIX) 75 MG tablet Take 1 tablet (75 mg total) by mouth daily. (Patient taking differently: Take 75 mg by mouth at bedtime. )  . Cyanocobalamin (VITAMIN B-12 PO) Place 1 mL under the tongue daily.   Marland Kitchen escitalopram (LEXAPRO) 10 MG tablet Take 1 tablet (10 mg total) at bedtime by mouth.  . ferrous sulfate 325 (65 FE) MG tablet  Take 1 tablet (325 mg total) by mouth daily with breakfast.  . glipiZIDE (GLUCOTROL) 5 MG tablet Take 1 tablet (5 mg total) by mouth daily before breakfast.  . Glycopyrrolate-Formoterol (BEVESPI AEROSPHERE) 9-4.8 MCG/ACT AERO Inhale 2 puffs into the lungs 2 (two) times daily. (Patient taking differently: Inhale 2 puffs into the lungs daily as needed. )  . levothyroxine (SYNTHROID, LEVOTHROID) 25 MCG tablet Take 25 mcg by mouth daily before breakfast.  . Melatonin 3 MG TABS Take 3 mg by mouth at bedtime.   . memantine (NAMENDA) 5 MG tablet Take 5 mg by mouth 2 (two) times daily.  . metoprolol succinate (TOPROL-XL) 25 MG 24 hr tablet Take 2 tablets (50 mg total) by mouth daily.  Take with or immediately following a meal. (Patient taking differently: Take 50 mg by mouth at bedtime. Take with or immediately following a meal.)  . modafinil (PROVIGIL) 100 MG tablet Take 100 mg by mouth at bedtime.  . Multiple Vitamin (MULTIVITAMIN WITH MINERALS) TABS tablet Take 1 tablet by mouth daily.   . nitroGLYCERIN (NITROSTAT) 0.4 MG SL tablet Place 0.4 mg under the tongue daily as needed for chest pain.   . ranitidine (ZANTAC) 150 MG tablet Take 1 tablet (150 mg total) by mouth 2 (two) times daily.  Marland Kitchen torsemide (DEMADEX) 20 MG tablet Take 20 mg by mouth at bedtime.   Allergies: Allergies as of 03/02/2017 - Review Complete 03/02/2017  Allergen Reaction Noted  . Novocain [procaine] Other (See Comments) 03/04/2015  . Rosuvastatin calcium Other (See Comments) 03/14/2015  . Statins Other (See Comments) 03/04/2015   Past Medical History:  Diagnosis Date  . Anxiety   . Arthritis    "knees, back, shoulders" (11/26/2016)  . CAD (coronary artery disease)    a. remote stenting >20 years ago. b. PTCA unknown vessel ~2013. c. 10/2015: s/p rotational atherectomy/DES to distal RCA and RPDA c/b embolic CVA  . CHB (complete heart block) ([place]) 02/2015   [name] 03/03/2015  . Chronic diastolic CHF (congestive heart failure) ([place])   . Chronic shoulder pain    "both shoulders" (11/26/2016)  . [name] (chronic kidney disease), stage III ([place])   . Confusion 12/2016  . COPD (chronic obstructive pulmonary disease) ([place])   . Depression   . Diabetes mellitus without complication ([place])    TYPE 2  . Dilated aortic root ([place])    a. mild by echo 09/2015.  Marland Kitchen GERD (gastroesophageal reflux disease)   . Headache    "weekly" (11/26/2016)  . Heart attack [facility]) 'many years ago'  . High cholesterol   . Hypertension   . Hypothyroidism   . OSA (obstructive sleep apnea)    "suppose to have a mask; they haven't got one adjusted for him yet" (11/26/2016)  . Presence of permanent cardiac pacemaker   . Sinus  arrest 03/04/2015  . Skin cancer of face    "had it cut off"  . Statin intolerance   . Stroke [facility]) 10/2015   "mini stroke; been having memory issues & confusion ever since" (11/26/2016)  . Symptomatic bradycardia    a. syncope/sinus arrest and bradycardia s/p St. Jude PPM 02/2015 with lead revision 03/2015.   Family History:  Family History  Problem Relation Age of Onset  . Heart disease Father   . Heart disease Mother   . Diabetes Sister   . Cancer Sister   . Diabetes Brother   . Diabetes Sister   . Lung disease Neg Hx    Social History:  -  former smoker - denies alcohol use, or other illicit drug use - Lives at home with wife and niece - Used to be a [employer] park manager  Review of Systems: A complete ROS was unable to be obtained secondary to AMS  Physical Exam: Blood pressure (!) 121/91, pulse 66, temperature 97.9 F (36.6 C), temperature source Oral, resp. rate 20, height 6' (1.829 m), weight 210 lb (95.3 kg), SpO2 95 %.  GEN: Elderly male sleeping in bed in NAD. Very sleepy, but arousable. No acute distress. HENT: [place]/AT. Dry mucous membranes. No visible lesions. EYES: PERRL. Sclera non-icteric. Conjunctiva clear. RESP: Clear to auscultation bilaterally. No wheezes, rales, or rhonchi. No increased work of breathing. CV: Normal rate and regular rhythm. No murmurs, gallops, or rubs. No LE edema. ABD: Soft. Non-tender. Non-distended. Normoactive bowel sounds. EXT: No LE edema. Warm and well perfused. NEURO: Unable to assess 2/2 AMS. Moving all four extremities spontaneously. PSYCH: Unable to assess 2/2 AMS  Labs CBC Latest Ref Rng & Units 03/02/2017 02/17/2017 01/16/2017  WBC 4.0 - 10.5 K/uL 7.5 8.0 7.8  Hemoglobin 13.0 - 17.0 g/dL 8.4(L) 8.7(L) 9.2(L)  Hematocrit 39.0 - 52.0 % 27.9(L) 29.4(L) 30.7(L)  Platelets 150 - 400 K/uL 259 260 262   CMP Latest Ref Rng & Units 03/02/2017 02/21/2017 02/18/2017  Glucose 65 - 99 mg/dL 92 102(H) 97  BUN 6 - 20 mg/dL 20 20 21(H)  Creatinine 0.61 -  1.24 mg/dL 2.35(H) 2.06(H) 1.85(H)  Sodium 135 - 145 mmol/L 137 145(H) 144  Potassium 3.5 - 5.1 mmol/L 3.4(L) 4.3 3.6  Chloride 101 - 111 mmol/L 99(L) 105 104  CO2 22 - 32 mmol/L 26 24 26   Calcium 8.9 - 10.3 mg/dL 9.1 9.5 9.3  Total Protein 6.5 - 8.1 g/dL 6.6 - -  Total Bilirubin 0.3 - 1.2 mg/dL 0.6 - -  Alkaline Phos 38 - 126 U/L 192(H) - -  AST 15 - 41 U/L 43(H) - -  ALT 17 - 63 U/L 20 - -   UA without UTI  EKG: personally reviewed my interpretation is sinus rhythm  CT abd/pelvis 1. Bulky metastatic disease in the liver with a focal circumferential lesion in the hepatic flexure, highly suspicious for colorectal neoplasm. 2. No evidence for urinary stone disease or urinary obstruction. 3.  Aortic Atherosclerois (ICD10-170.0)  Assessment & Plan by Problem: Active Problems:   Renal failure  Mr. Saab is a 77yo male with PMH significant for moderate-severe dementia ([place] 15), complete heart block s/p PPM, CAD s/p RCA stent in 2017 on aspirin and Plavix, OSA (not on CPAP), COPD, [name], CVA, and HFpEF (LVEF 60-65%, grade 1 DD in 10/2016) who presents worsened RLQ abdominal pain and weight loss. CT abd/pelvis in the ED showed bulky metastatic disease in the liver with a focal circumferential lesion in hepatic flexure, concerning for colorectal neoplasm. Also noted to have AKI, most likely secondary to dehydration.  Patient's wife was informed of the results of today's CT scan. A long discussion was held with patient's family regarding next steps. We emphasized that the most important first step is to get a biopsy done to determine the pathology. After that, we will need to set up outpatient follow up with Oncology for patient's treatment options. We discussed that other decisions regarding continuing other medications/clinic visits should be considered after discussing the options with Oncology. Wife also raised the concern that there have been some difficulties with goals of care/end of life  care amongst the patient's children and herself. She states  that he would want to be resuscitated if he has 60% function/ability to do his daily activities (which she describes as going on a car ride, walking to the living room, and being around family), and when asked whether that was the case prior to today, she stated that he was able to perform at at least 60%. She stated that the two oldest children that are listed on the POA have wanted him to be on hospice since several months ago, which has created some difficulty amongst their family. She further informed us that the patient has recently described that he is preparing himself to be with his previous wife (who had passed away) and that she feels that he is "giving up". We discussed that his end of life goals and care do not have to be immediately decided upon but that further discussion would be very important as we find out more about this newly identified mass.  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 Noted to have weight loss of ~20lbs since November 2018. No noted hematochezia or melena. Per chart review, it appears he had a colonoscopy in 2017 and was planned to repeat in 10 years. He also had a CT chest in 02/2015 that showed hypodensities in the liver, likely representing cysts. CT on admission shows mass in hepatic flexure with metastatic disease in the liver, concerning for colorectal neoplasm. Discussed with IR for possible liver biopsy - patient is on plavix and will need a 5-day hold period prior to biopsy. Patient is unlikely to be inpatient for this amount of time, and thus he will require outpatient order for U/S guided biopsy. Radiology will be able to schedule this and coordinate timing of holding plavix. - Outpatient U/S guided liver biopsy --- Will need 5-day hold period for plavix prior to biopsy - Outpatient f/u with  Oncology  AKI on [name] Cr 2.35 (baseline 1.3-1.6). Appears volume down on exam. Likely related to reduced PO intake and dehydration. - IV NS 161mL/hr x20h - BMP in AM  Normocytic anemia Hb 8.4 (baseline 8.7-10.3). Has steadily been decreasing over the last 6 months. Previous work-up showed elevated K/L light chain ratio, with plan for further evaluation with 24h urine total protein, 24h creatinine clearance, and urine IFE - CBC in AM  Complete heart block, s/p PPM CAD s/p stents HFpEF (LVEF 60-65%, grade 1 DD in 10/2016) Hemodynamically stable. - Continue aspirin and plavix - Continue home metoprolol 50mg  daily - Hold home torsemide 20mg  daily 2/2 AKI  Dementia ([place] 15) Hx of CVA Follows with Neurology - Delirium precautions - Continue home memantine 5mg  BID - Continue home modafinil 100mg  qAM  Hypothyroidism - Continue home levothyroxine 86mcg daily  DM A1c 7.7. On home glipizide - SSI-S TID - CBG monitoring  Diet: HH/CM VTE PPx: Lovenox Code Status: Full code Dispo: Admit patient to Observation with expected length of stay less than 2 midnights.  Signed: Colbert Ewing, MD 03/02/2017, 2:21 PM  Pager: Mamie Nick [phone]

## 2017-03-02 NOTE — Progress Notes (Signed)
1530 Received pt from ED. Patient sleepy but easily aroused.

## 2017-03-02 NOTE — ED Notes (Signed)
Taken to [facility]

## 2017-03-02 NOTE — ED Notes (Signed)
Lunch tray ordered; regular diet 

## 2017-03-02 NOTE — ED Triage Notes (Signed)
BIB EMS from home for eval of lower abd pain. Per wife pt was "grimacing" and has not urinated for 24 hrs. Upon assessment pt points to his RLQ, is non tender upon assessment.

## 2017-03-02 NOTE — ED Notes (Addendum)
Report accepted by [facility] on [facility], they will send transport for PT. Family wants PT's dentures to remain with PT. They are in his jacket pocket in a pink denture container with his sticker on container. Jacket in belonging bag

## 2017-03-02 NOTE — ED Notes (Signed)
Family requests to speak to admitting physician about admission. Family would like to take PT home. PT has bed assignment. Family does not want PT moved until they are able to speak with admitting team. Internal medicine has been paged.

## 2017-03-02 NOTE — ED Notes (Signed)
Pt has no complaints at this time  

## 2017-03-02 NOTE — ED Notes (Signed)
Admitting team at bedside. Family agrees to admission

## 2017-03-02 NOTE — Progress Notes (Signed)
IR aware of request for liver lesion bx while the patient is admitted.  Unfortunately, he is on plavix.  This is a 5 day hold prior to a bx, certainly a liver biopsy.  I discussed this with Dr. Benjamine Mola and informed him of this.  The patient will not be admitted for that many days, so he will follow up with the patient for an outpatient biopsy.  [name] 4:18 PM 03/02/2017

## 2017-03-03 ENCOUNTER — Telehealth: Payer: Self-pay

## 2017-03-03 DIAGNOSIS — N189 Chronic kidney disease, unspecified: Secondary | ICD-10-CM | POA: Diagnosis not present

## 2017-03-03 DIAGNOSIS — N179 Acute kidney failure, unspecified: Secondary | ICD-10-CM | POA: Diagnosis not present

## 2017-03-03 DIAGNOSIS — E860 Dehydration: Secondary | ICD-10-CM | POA: Diagnosis not present

## 2017-03-03 DIAGNOSIS — I5030 Unspecified diastolic (congestive) heart failure: Secondary | ICD-10-CM

## 2017-03-03 DIAGNOSIS — K6389 Other specified diseases of intestine: Secondary | ICD-10-CM | POA: Diagnosis not present

## 2017-03-03 DIAGNOSIS — F0390 Unspecified dementia without behavioral disturbance: Secondary | ICD-10-CM | POA: Diagnosis not present

## 2017-03-03 DIAGNOSIS — E1122 Type 2 diabetes mellitus with diabetic chronic kidney disease: Secondary | ICD-10-CM | POA: Diagnosis not present

## 2017-03-03 DIAGNOSIS — R932 Abnormal findings on diagnostic imaging of liver and biliary tract: Secondary | ICD-10-CM | POA: Diagnosis not present

## 2017-03-03 DIAGNOSIS — D509 Iron deficiency anemia, unspecified: Secondary | ICD-10-CM | POA: Diagnosis not present

## 2017-03-03 DIAGNOSIS — I2510 Atherosclerotic heart disease of native coronary artery without angina pectoris: Secondary | ICD-10-CM | POA: Diagnosis not present

## 2017-03-03 LAB — BASIC METABOLIC PANEL
Anion gap: 10 (ref 5–15)
BUN: 16 mg/dL (ref 6–20)
CO2: 26 mmol/L (ref 22–32)
Calcium: 8.8 mg/dL — ABNORMAL LOW (ref 8.9–10.3)
Chloride: 105 mmol/L (ref 101–111)
Creatinine, Ser: 1.72 mg/dL — ABNORMAL HIGH (ref 0.61–1.24)
GFR calc Af Amer: 42 mL/min — ABNORMAL LOW (ref >60)
GFR calc non Af Amer: 36 mL/min — ABNORMAL LOW (ref >60)
Glucose, Bld: 81 mg/dL (ref 65–99)
Potassium: 3.5 mmol/L (ref 3.5–5.1)
Sodium: 141 mmol/L (ref 135–145)

## 2017-03-03 LAB — URINE CULTURE: CULTURE: NO GROWTH

## 2017-03-03 LAB — CBC
HCT: 27.8 % — ABNORMAL LOW (ref 39.0–52.0)
Hemoglobin: 8.2 g/dL — ABNORMAL LOW (ref 13.0–17.0)
MCH: 23.2 pg — ABNORMAL LOW (ref 26.0–34.0)
MCHC: 29.5 g/dL — ABNORMAL LOW (ref 30.0–36.0)
MCV: 78.5 fL (ref 78.0–100.0)
Platelets: 236 10*3/uL (ref 150–400)
RBC: 3.54 MIL/uL — ABNORMAL LOW (ref 4.22–5.81)
RDW: 17.7 % — ABNORMAL HIGH (ref 11.5–15.5)
WBC: 5.7 10*3/uL (ref 4.0–10.5)

## 2017-03-03 LAB — PROTIME-INR
INR: 1.22
Prothrombin Time: 15.3 seconds — ABNORMAL HIGH (ref 11.4–15.2)

## 2017-03-03 LAB — GLUCOSE, CAPILLARY: Glucose-Capillary: 72 mg/dL (ref 65–99)

## 2017-03-03 MED ORDER — MODAFINIL 100 MG PO TABS: 100.0000 mg | ORAL_TABLET | Freq: Every morning | ORAL | 0 refills | Status: DC

## 2017-03-03 NOTE — Telephone Encounter (Signed)
Hospital TOC per Dr Ronalee Red, appt 03/08/2017, discharge 03/03/2017.

## 2017-03-03 NOTE — Care Management Obs Status (Signed)
[place] NOTIFICATION   Patient Details  Name: [name] MRN: [id number] Date of Birth: 1940/11/18 Notified of code 4. Went to patient's room, patient dressed ready for discharge. Patient has dementia. RN stated wife left to go bring car around . Called wife [name] [phone] left message.  Medicare Observation Status Notification Given:  Other (see comment)    Jacalyn Lefevre Edson Snowball, RN 03/03/2017, 11:38 AM

## 2017-03-03 NOTE — Plan of Care (Signed)
  Education: Knowledge of General Education information will improve 03/03/2017 0436 - Progressing by Anson Fret, RN Note POC reviewed with pt./wife; pt. forgetful.

## 2017-03-03 NOTE — Discharge Summary (Signed)
Name: Keynan Heffern MRN: [id number] DOB: January 31, 1940 77 y.o. PCP: Lars Mage, MD  Date of Admission: 03/02/2017  6:46 AM Date of Discharge: 03/03/2017 Attending Physician: Oda Kilts, MD  Discharge Diagnosis: 1. Concern for colon cancer metastasized to liver 2. AKI on [name] 2/2 3. Dehydration  Principal Problem:   Colon cancer metastasized to liver [facility]) Active Problems:   Diastolic CHF ([place])   Alzheimer disease type 3   Loss of weight   Dehydration   Acute renal failure superimposed on chronic kidney disease [facility])   Discharge Medications: Allergies as of 03/03/2017      Reactions   Novocain [procaine] Other (See Comments)   Hallucinations   Rosuvastatin Calcium Other (See Comments)   Whole body ache/pain   Statins Other (See Comments)   Whole body ache/pain      Medication List    TAKE these medications   acetaminophen 500 MG tablet Commonly known as:  TYLENOL Take 500 mg by mouth every 6 (six) hours as needed for headache.   aspirin EC 81 MG tablet Take 81 mg by mouth daily.   clopidogrel 75 MG tablet Commonly known as:  PLAVIX Take 1 tablet (75 mg total) by mouth daily. What changed:  when to take this   escitalopram 10 MG tablet Commonly known as:  LEXAPRO Take 1 tablet (10 mg total) at bedtime by mouth.   ferrous sulfate 325 (65 FE) MG tablet Take 1 tablet (325 mg total) by mouth daily with breakfast.   glipiZIDE 5 MG tablet Commonly known as:  GLUCOTROL Take 1 tablet (5 mg total) by mouth daily before breakfast.   Glycopyrrolate-Formoterol 9-4.8 MCG/ACT Aero Commonly known as:  BEVESPI AEROSPHERE Inhale 2 puffs into the lungs 2 (two) times daily. What changed:    when to take this  reasons to take this   levothyroxine 25 MCG tablet Commonly known as:  SYNTHROID, LEVOTHROID Take 25 mcg by mouth daily before breakfast.   Melatonin 3 MG Tabs Take 3 mg by mouth at bedtime.   memantine 5 MG tablet Commonly known as:   NAMENDA Take 5 mg by mouth 2 (two) times daily.   metoprolol succinate 25 MG 24 hr tablet Commonly known as:  TOPROL-XL Take 2 tablets (50 mg total) by mouth daily. Take with or immediately following a meal. What changed:    when to take this  additional instructions   modafinil 100 MG tablet Commonly known as:  PROVIGIL Take 1 tablet (100 mg total) by mouth every morning. Start taking on:  03/04/2017 What changed:  when to take this   multivitamin with minerals Tabs tablet Take 1 tablet by mouth daily.   nitroGLYCERIN 0.4 MG SL tablet Commonly known as:  NITROSTAT Place 0.4 mg under the tongue daily as needed for chest pain.   PROAIR HFA 108 (90 Base) MCG/ACT inhaler Generic drug:  albuterol Inhale 1-2 puffs into the lungs every 4 (four) hours as needed for shortness of breath.   ranitidine 150 MG tablet Commonly known as:  ZANTAC Take 1 tablet (150 mg total) by mouth 2 (two) times daily.   torsemide 20 MG tablet Commonly known as:  DEMADEX Take 20 mg by mouth at bedtime.   VITAMIN B-12 PO Place 1 mL under the tongue daily.       Disposition and follow-up:   Mr.Poseidon Highfill was discharged from [facility] in Stable condition.  At the hospital follow up visit please address:  1.  Patient  was found to have new mass in hepatic flexure with multiple lesions in liver. Order was placed for outpatient U/S guided biopsy with Radiology. Radiology to call patient to schedule and inform patient of instructions prior to biopsy (including when to hold plavix). CEA pending on discharge. After biopsy, will probably need Oncology.  2.  AKI 2/2 dehydration: Improved with IVF administration, Cr not quite back to baseline on discharge (Cr ~1.3-1.6). Patient instructed to hold home torsemide on 2/21 and resume home dose on 2/22.  3.  Labs / imaging needed at time of follow-up: BMET  4.  Pending labs/ test needing follow-up: CEA, U/S guided biopsy  Follow-up  Appointments: Follow-up Information    Lars Mage, MD. Go on 03/08/2017.   Specialty:  Internal Medicine Why:  10:15am Contact information: [place] [address] [phone]           Hospital Course by problem list: Principal Problem:   Colon cancer metastasized to liver [facility]) Active Problems:   Diastolic CHF ([place])   Alzheimer disease type 3   Loss of weight   Dehydration   Acute renal failure superimposed on chronic kidney disease ([place])   1. Concern for colon cancer metastasized to liver Patient presents with intermittent abdominal pain since November 2018, that acutely worsened last night. Noted to have weight loss of ~20lbs since November 2018. Denies hematochezia or melena. Per chart review, it appears he had a colonoscopy in 2017 (no results found) and was told to repeat in 10 years. He also had a CT chest in 02/2015 that showed hypodensities in the liver, likely representing cysts. CT on admission showed new mass in hepatic flexure with metastatic disease in the liver, concerning for colorectal neoplasm. Discussed with IR for possible liver biopsy - patient is on plavix and will need a 5-day hold period prior to biopsy. Order placed for outpatient U/S guided biopsy. Radiology will schedule this and coordinate timing of holding plavix.  2. AKI on [name] 2/2 Dehydration Cr on admission 2.35 (baseline ~1.3-1.6) and appeared volume down on exam, likely related to reduced PO intake and dehydration. Home torsemide was held. He received IVF with improvement in his Cr to 1.72, although not yet to baseline. He was instructed to continue holding his home torsemide on 2/21 and resume on 2/22. Discharged with close follow up in clinic for repeat labs.  3. Dementia Follows with Neurology. He was recommended to take melatonin in the evening and modafinil first thing in the morning. Modafinil was listed as an evening medication prior to admission. Patient not sure when he was  taking these medications. Recommended to wife for patient to start taking modafinil in the morning.  Discharge Vitals:   BP (!) 137/118 (BP Location: Right Arm)   Pulse 60   Temp 98.1 F (36.7 C) (Oral)   Resp 17   Ht 6' (1.829 m)   Wt 210 lb (95.3 kg)   SpO2 98%   BMI 28.48 kg/m   Pertinent Labs, Studies, and Procedures:  CBC Latest Ref Rng & Units 03/03/2017 03/02/2017 02/17/2017  WBC 4.0 - 10.5 K/uL 5.7 7.5 8.0  Hemoglobin 13.0 - 17.0 g/dL 8.2(L) 8.4(L) 8.7(L)  Hematocrit 39.0 - 52.0 % 27.8(L) 27.9(L) 29.4(L)  Platelets 150 - 400 K/uL 236 259 260   CMP Latest Ref Rng & Units 03/03/2017 03/02/2017 02/21/2017  Glucose 65 - 99 mg/dL 81 92 102(H)  BUN 6 - 20 mg/dL 16 20 20   Creatinine 0.61 - 1.24 mg/dL  1.72(H) 2.35(H) 2.06(H)  Sodium 135 - 145 mmol/L 141 137 145(H)  Potassium 3.5 - 5.1 mmol/L 3.5 3.4(L) 4.3  Chloride 101 - 111 mmol/L 105 99(L) 105  CO2 22 - 32 mmol/L 26 26 24   Calcium 8.9 - 10.3 mg/dL 8.8(L) 9.1 9.5  Total Protein 6.5 - 8.1 g/dL - 6.6 -  Total Bilirubin 0.3 - 1.2 mg/dL - 0.6 -  Alkaline Phos 38 - 126 U/L - 192(H) -  AST 15 - 41 U/L - 43(H) -  ALT 17 - 63 U/L - 20 -   UA without UTI UCx negative CEA pending INR 1.22  CT abdomen/pelvis 03/02/2017 1. Bulky metastatic disease in the liver with a focal circumferential lesion in the hepatic flexure, highly suspicious for colorectal neoplasm. 2. No evidence for urinary stone disease or urinary obstruction. 3.  Aortic Atherosclerois (ICD10-170.0)  Discharge Instructions: Discharge Instructions    Call MD for:  difficulty breathing, headache or visual disturbances   Complete by:  As directed    Call MD for:  persistant dizziness or light-headedness   Complete by:  As directed    Call MD for:  severe uncontrolled pain   Complete by:  As directed    Diet - low sodium heart healthy   Complete by:  As directed    Discharge instructions   Complete by:  As directed    Mr. Zyrus, [place] have an appointment to  follow up in IMTS clinic on 2/26 at 10:15am.  You were admitted to the hospital for dehydration. We gave you IV fluids, which helped your kidney function get better. Please follow up in clinic for a re-check of your labs. Please also DO NOT TAKE torsemide today (2/21). RESTART your torsemide 20mg  daily starting tomorrow (2/22).  You were also found to have a new mass in your colon with new lesions in your liver. We have placed an order to get you an outpatient biopsy to figure out what the mass is. The Radiology office will call you to schedule this and let you know about any instructions that are needed before getting that done. Once we get this done, we will be able to figure out what your options are moving forward.  Please also CHANGE your modafinil to 100mg  IN THE MORNING. This will hopefully help keep you awake during the day so that you can sleep better at night.   Increase activity slowly   Complete by:  As directed      Signed: Colbert Ewing, MD 03/03/2017, 11:28 AM   Pager: Mamie Nick [phone]

## 2017-03-03 NOTE — Progress Notes (Signed)
Subjective:  Mr. Seeley was seen lying in bed comfortably this morning. He denied abdominal pain, bloody BMs, nausea, emesis, or changes in BMs. He denies lightheadedness or dizziness. Wife states he was complaining of intermittent abdominal pain overnight.  Objective:  Vital signs in last 24 hours: Vitals:   03/02/17 1429 03/02/17 1530 03/02/17 2211 03/03/17 0447  BP: (!) 129/94 (!) 146/85 (!) 106/59 (!) 146/91  Pulse: 65 61 60 (!) 59  Resp: 18 18 17 18   Temp:  98.5 F (36.9 C) 98.4 F (36.9 C) 97.8 F (36.6 C)  TempSrc:  Axillary Oral Oral  SpO2: 100% 100% 97% 99%  Weight:      Height:       GEN: Well-appearing, well-nourished. Alert. No acute distress.  HENT: [place]/AT. Moist mucous membranes. No visible lesions. EYES: PERRL. Sclera non-icteric. Conjunctiva clear. RESP: Clear to auscultation bilaterally. No wheezes, rales, or rhonchi. No increased work of breathing. CV: Normal rate and regular rhythm. No murmurs, gallops, or rubs. No LE edema. ABD: Soft. Non-tender. Non-distended. Normoactive bowel sounds. EXT: No edema. Warm and well perfused. SKIN: Pale abdominal skin NEURO: Cranial nerves II-XII grossly intact. Able to lift all four extremities against gravity. No apparent audiovisual hallucinations. Speech fluent and appropriate. PSYCH: Patient is calm and pleasant. Appropriate affect. Well-groomed; speech is appropriate and on-subject.  Assessment/Plan:  Principal Problem:   Colon cancer metastasized to liver [facility]) Active Problems:   Diastolic CHF ([place])   Alzheimer disease type 3   Loss of weight   Dehydration   Acute renal failure superimposed on chronic kidney disease [facility])  Mr. Pooley is a 77yo male with PMH significant for moderate-severe dementia ([place] 15), complete heart block s/p PPM, CAD s/p RCA stent in 2017 on aspirin and Plavix, OSA(not on CPAP), COPD, [name], CVA, and HFpEF (LVEF 60-65%, grade 1 DD in 10/2016) who presents worsened RLQ abdominal pain  and weight loss. CT abd/pelvis in the ED showed bulky metastatic disease in the liver with a focal circumferential lesion in hepatic flexure, concerning for colorectal neoplasm. Also noted to have AKI, most likely secondary to dehydration, which has improved after IVF administration.  Possible metastatic colorectal neoplasm CT on admission shows mass in hepatic flexure with metastatic disease in the liver, concerning for colorectal neoplasm. Patient will need outpatient U/S guided liver biopsy with IR - requires 5-day hold period for plavix prior to biopsy. Also will need outpatient f/u with Oncology. - Outpatient U/S guided liver biopsy --- Will need 5-day hold period for plavix prior to biopsy --- Radiology to schedule - Outpatient f/u with Oncology  AKI on [name] Cr 2.35 -> 1.72 (baseline 1.3-1.6), after IVF resuscitation. Likely related to reduced PO intake and dehydration. - F/u in IMTS clinic  Normocytic anemia Hb 8.4 -> 8.2 (baseline 8.7-10.3). Has steadily been decreasing over the last 6 months. Previous work-up showed elevated K/L light chain ratio, with plan for further evaluation with 24h urine total protein, 24h creatinine clearance, and urine IFE. With new diagnosis of possible colorectal cancer, patient may not need remainder of this work-up.  Complete heart block, s/p PPM CAD s/p stents HFpEF(LVEF 60-65%, grade 1 DD in 10/2016) Hemodynamically stable. Euvolemic on exam. - Continue aspirin and plavix - Continue home metoprolol 50mg  daily - Resume home torsemide 20mg  daily tomorrow  Dementia (MOCA 15) Hx of CVA Follows with Neurology - Delirium precautions - Continue home memantine 5mg  BID - Continue home modafinil 100mg  qAM  Hypothyroidism - Continue home levothyroxine 87mcg daily  DM A1c 7.7. On home glipizide - SSI-S TID - CBG monitoring  Dispo: Anticipated discharge today.  Colbert Ewing, MD 03/03/2017, 6:56 AM Pager: Mamie Nick [phone]

## 2017-03-04 DIAGNOSIS — I2510 Atherosclerotic heart disease of native coronary artery without angina pectoris: Secondary | ICD-10-CM | POA: Diagnosis not present

## 2017-03-04 DIAGNOSIS — N183 Chronic kidney disease, stage 3 (moderate): Secondary | ICD-10-CM | POA: Diagnosis not present

## 2017-03-04 DIAGNOSIS — M150 Primary generalized (osteo)arthritis: Secondary | ICD-10-CM | POA: Diagnosis not present

## 2017-03-04 DIAGNOSIS — I130 Hypertensive heart and chronic kidney disease with heart failure and stage 1 through stage 4 chronic kidney disease, or unspecified chronic kidney disease: Secondary | ICD-10-CM | POA: Diagnosis not present

## 2017-03-04 DIAGNOSIS — R2689 Other abnormalities of gait and mobility: Secondary | ICD-10-CM | POA: Diagnosis not present

## 2017-03-04 DIAGNOSIS — J449 Chronic obstructive pulmonary disease, unspecified: Secondary | ICD-10-CM | POA: Diagnosis not present

## 2017-03-04 DIAGNOSIS — I5032 Chronic diastolic (congestive) heart failure: Secondary | ICD-10-CM | POA: Diagnosis not present

## 2017-03-04 DIAGNOSIS — G309 Alzheimer's disease, unspecified: Secondary | ICD-10-CM | POA: Diagnosis not present

## 2017-03-04 DIAGNOSIS — F0280 Dementia in other diseases classified elsewhere without behavioral disturbance: Secondary | ICD-10-CM | POA: Diagnosis not present

## 2017-03-04 LAB — CEA: CEA: 205.1 ng/mL — ABNORMAL HIGH (ref 0.0–4.7)

## 2017-03-07 ENCOUNTER — Encounter: Payer: Self-pay | Admitting: Oncology

## 2017-03-07 ENCOUNTER — Telehealth: Payer: Self-pay | Admitting: Oncology

## 2017-03-07 ENCOUNTER — Telehealth: Payer: Self-pay

## 2017-03-07 NOTE — Telephone Encounter (Signed)
Levada Dy with [facility] requesting VO. Please call back.

## 2017-03-07 NOTE — Telephone Encounter (Signed)
Appt has been scheduled for the pt to see Dr. Benay Spice on 3/4 at 1130am. Letter mailed to the appt.

## 2017-03-07 NOTE — Telephone Encounter (Signed)
Appt has been scheduled with the pt's wife for him to see Dr. Benay Spice on 3/4 at 1130am. Aware to arrive 30 minutes early. Letter and directions mailed.

## 2017-03-08 ENCOUNTER — Ambulatory Visit (INDEPENDENT_AMBULATORY_CARE_PROVIDER_SITE_OTHER): Payer: Medicare HMO | Admitting: Student in an Organized Health Care Education/Training Program

## 2017-03-08 ENCOUNTER — Other Ambulatory Visit: Payer: Self-pay

## 2017-03-08 VITALS — BP 98/70 | HR 95 | Temp 97.8°F | Ht 72.0 in | Wt 206.3 lb

## 2017-03-08 DIAGNOSIS — D509 Iron deficiency anemia, unspecified: Secondary | ICD-10-CM

## 2017-03-08 DIAGNOSIS — I959 Hypotension, unspecified: Secondary | ICD-10-CM

## 2017-03-08 DIAGNOSIS — I2510 Atherosclerotic heart disease of native coronary artery without angina pectoris: Secondary | ICD-10-CM | POA: Diagnosis not present

## 2017-03-08 DIAGNOSIS — G309 Alzheimer's disease, unspecified: Secondary | ICD-10-CM | POA: Diagnosis not present

## 2017-03-08 DIAGNOSIS — F0280 Dementia in other diseases classified elsewhere without behavioral disturbance: Secondary | ICD-10-CM

## 2017-03-08 DIAGNOSIS — E119 Type 2 diabetes mellitus without complications: Secondary | ICD-10-CM

## 2017-03-08 DIAGNOSIS — C787 Secondary malignant neoplasm of liver and intrahepatic bile duct: Secondary | ICD-10-CM

## 2017-03-08 DIAGNOSIS — N183 Chronic kidney disease, stage 3 (moderate): Secondary | ICD-10-CM | POA: Diagnosis not present

## 2017-03-08 DIAGNOSIS — M150 Primary generalized (osteo)arthritis: Secondary | ICD-10-CM | POA: Diagnosis not present

## 2017-03-08 DIAGNOSIS — G300 Alzheimer's disease with early onset: Secondary | ICD-10-CM

## 2017-03-08 DIAGNOSIS — D692 Other nonthrombocytopenic purpura: Secondary | ICD-10-CM | POA: Diagnosis not present

## 2017-03-08 DIAGNOSIS — I5032 Chronic diastolic (congestive) heart failure: Secondary | ICD-10-CM | POA: Diagnosis not present

## 2017-03-08 DIAGNOSIS — C189 Malignant neoplasm of colon, unspecified: Secondary | ICD-10-CM | POA: Diagnosis not present

## 2017-03-08 DIAGNOSIS — I9589 Other hypotension: Secondary | ICD-10-CM

## 2017-03-08 DIAGNOSIS — Z79891 Long term (current) use of opiate analgesic: Secondary | ICD-10-CM | POA: Diagnosis not present

## 2017-03-08 DIAGNOSIS — R2689 Other abnormalities of gait and mobility: Secondary | ICD-10-CM | POA: Diagnosis not present

## 2017-03-08 DIAGNOSIS — J449 Chronic obstructive pulmonary disease, unspecified: Secondary | ICD-10-CM | POA: Diagnosis not present

## 2017-03-08 DIAGNOSIS — E861 Hypovolemia: Secondary | ICD-10-CM

## 2017-03-08 DIAGNOSIS — I130 Hypertensive heart and chronic kidney disease with heart failure and stage 1 through stage 4 chronic kidney disease, or unspecified chronic kidney disease: Secondary | ICD-10-CM | POA: Diagnosis not present

## 2017-03-08 LAB — BASIC METABOLIC PANEL
Anion gap: 11 (ref 5–15)
BUN: 13 mg/dL (ref 6–20)
CHLORIDE: 101 mmol/L (ref 101–111)
CO2: 26 mmol/L (ref 22–32)
CREATININE: 2.08 mg/dL — AB (ref 0.61–1.24)
Calcium: 9.2 mg/dL (ref 8.9–10.3)
GFR, EST AFRICAN AMERICAN: 33 mL/min — AB (ref >60)
GFR, EST NON AFRICAN AMERICAN: 29 mL/min — AB (ref >60)
Glucose, Bld: 100 mg/dL — ABNORMAL HIGH (ref 65–99)
Potassium: 3 mmol/L — ABNORMAL LOW (ref 3.5–5.1)
SODIUM: 138 mmol/L (ref 135–145)

## 2017-03-08 LAB — CBC
HCT: 30.3 % — ABNORMAL LOW (ref 39.0–52.0)
HEMOGLOBIN: 8.9 g/dL — AB (ref 13.0–17.0)
MCH: 23.1 pg — ABNORMAL LOW (ref 26.0–34.0)
MCHC: 29.4 g/dL — AB (ref 30.0–36.0)
MCV: 78.5 fL (ref 78.0–100.0)
PLATELETS: 272 10*3/uL (ref 150–400)
RBC: 3.86 MIL/uL — AB (ref 4.22–5.81)
RDW: 18.2 % — ABNORMAL HIGH (ref 11.5–15.5)
WBC: 8.1 10*3/uL (ref 4.0–10.5)

## 2017-03-08 MED ORDER — OXYCODONE HCL 5 MG PO TABS: 5.0000 mg | ORAL_TABLET | Freq: Four times a day (QID) | ORAL | 0 refills | Status: AC | PRN

## 2017-03-08 NOTE — Patient Instructions (Addendum)
1. I am going to order Home Hospice to start and keep you comfortable at home.   2. I have changed your medication list to only focus on medicines to keep you comfortable.   3. I have cancelled the biopsy appointment as the result is not going to change anything.

## 2017-03-08 NOTE — Progress Notes (Signed)
Assessment and Plan:  See Encounters tab for problem-based medical decision making.   __________________________________________________________  HPI:   77 year old man here for hospital follow-up from an admission for abdominal pain.  CT abdomen pelvis showed numerous hepatic lesions consistent with metastatic disease and a circumferential thickened mass around the colon at the hepatic flexure most consistent with a new diagnosis of metastatic colon cancer.  Initial plan was to pursue a liver biopsy of the metastatic disease to confirm the diagnosis, the patient was discharged with an outpatient plan after holding Plavix for 5 days.  Currently is scheduled for Thursday.  Since being home the patient has been doing okay, his functional status is still below his baseline.  He has advanced Alzheimer's dementia and is cared for by his wife.  She reports that he needs assistance in most of his activities of daily living.   Some point last night the patient got out of bed and wandered off, his wife does not know when.  She awoke he was missing and she found him outside covered in mild sitting in the car.  She does not know how long he was out there.  She says that he was very weak afterwards.  We came to clinic this morning he was in a wheelchair.  He had difficulty standing.  He was found to be hypotensive.  Currently the patient has no complaints, he is drinking a bottle of water.  Denies having any increased abdominal pain.  They report that he is eating and drinking fairly well throughout the day, though she does think his oral intake is down from his usual.  He is urinating and having bowel movements pretty much as normal.  She reports good compliance with his medications and reviewed his medication list.  He denies having any pain or complaints today.  He says he has some sinus congestion.  Wife tells me that she was able to talk to him about the likely diagnosis of cancer when he was more clear  at home.  Patient told his wife "I am ready to go home to [place]", by which he means he is ready to pass away.  He is retired Company secretary.  We asked him if he still wants to pursue a biopsy of the liver masses to have a diagnosis, he said that he would prefer to hold off and instead allowing nature to take its course.  __________________________________________________________  Problem List: Patient Active Problem List   Diagnosis Date Noted  . Hypotension 03/08/2017  . Colon cancer metastasized to liver ([place]) 03/02/2017  . Anemia 02/21/2017  . Type 2 diabetes mellitus without complication, without long-term current use of insulin ([place]) 11/29/2016  . Alpha-1-antitrypsin deficiency carrier ([place]) 11/25/2016  . Chronic renal insufficiency, stage 3 (moderate) ([place]) 11/05/2015  . CAD-S/P remote PCI 20 yrs ago, ? 4 years ago, and s/p RCA PCI/DES 10/22/15 10/23/2015  . Alzheimer disease type 3 03/10/2015  . Complete atrioventricular block ([place]) 03/03/2015    Medications: Reconciled today in Epic __________________________________________________________  Physical Exam:  Vital Signs: Vitals:   03/08/17 1139 03/08/17 1207 03/08/17 1231  BP: (!) 89/71 (!) 78/58 98/70  Pulse: 90 95   Temp: 97.8 F (36.6 C)    TempSrc: Oral    SpO2: 100% 100%   Weight: 206 lb 4.8 oz (93.6 kg)    Height: 6' (1.829 m)      Gen: Frail appearing man, sitting in a wheelchair, wearing a sports coat, button-down, and slacks. ENT: Dry mucous  membranes.  Neck: No cervical LAD, No thyromegaly or nodules, No JVD. CV: RRR, no murmurs Pulm: Normal effort, CTA throughout, no wheezing Abd: Soft, NT, ND. Ext: Warm, no edema, normal joints.  Skin: No atypical appearing moles. No rashes.  Skin tenting, senile purpura, Neuro: Awake, alert, not oriented to place or time, moves upper and lower extremities with full strength.   I spent 40 minutes with the patient with over 50% of the encounter time dedicated to counseling on  the above problems.

## 2017-03-08 NOTE — Progress Notes (Signed)
error 

## 2017-03-08 NOTE — Assessment & Plan Note (Signed)
Recent admission for widely metastatic malignancy presumed to be colon cancer.  CT abdomen and pelvis showed numerous hepatic lesions and a circumferential colonic mass at the hepatic flexure most consistent with colon cancer origin.  His pain is much improved since discharge.  Functional status is still very low, owing mostly to his advanced dementia.  Spent a long time talking with his wife and the patient about where to go from here.  His wife was able to ask the patient during moments of clarity about his advanced wishes, and the patient has made it clear that he would prefer to take a comfort oriented path.  I think this is very reasonable, liver biopsy will be very invasive requiring general anesthesia, and I do not see how the results were going to change his treatment course given his advanced dementia and very low functional status.  We decided on a plan today to cancel the biopsy.  I have ordered to initiate home hospice services.  Patient and his wife desire to stay at home for as long as possible and to be as comfortable as possible.  I discontinued several of his medications to prevent comfort oriented.  Prescribed oxycodone 5 mg to be used as needed for abdominal pain.  We can escalate or add further anxiety or pain medications as needed and coordination with hospice services.

## 2017-03-08 NOTE — Assessment & Plan Note (Signed)
End-stage Alzheimer's dementia, now complicated by metastatic malignancy likely colon cancer.  We are transitioning to comfort oriented measures, and initiating home hospice care.  Going to discontinue modafinil and amantadine as they are likely no benefit at this point.  I talked to his wife about appropriate supervision at home, she has a good amount of support from family members and she is with the patient 24 hours a day.  Hopefully will be able to arrange respite services and more support through hospice as well.

## 2017-03-08 NOTE — Assessment & Plan Note (Signed)
Patient arrived to the clinic hypotensive and diffusely weak.  I think this is hypovolemia in the setting of reduced oral intake from his acute illness.  Plan is to discontinue torsemide.  I checked a CBC which ruled out acute anemia.  Pending BMP.  We will give the patient a bottle of water, he drank it and looked well, repeat blood pressure was much better.

## 2017-03-09 ENCOUNTER — Telehealth: Payer: Self-pay | Admitting: *Deleted

## 2017-03-09 NOTE — Telephone Encounter (Signed)
Pt being referred to [facility] DrVincent's request, appts for both [place] and biopsy has been canceled.  Attempted to cancel remote pacer check, but cardiology will continue check pacer remotely, while patient is in hospice care.Regenia Skeeter, Darlene Cassady2/27/20193:53 PM

## 2017-03-10 ENCOUNTER — Telehealth: Payer: Self-pay | Admitting: Student in an Organized Health Care Education/Training Program

## 2017-03-10 ENCOUNTER — Encounter (HOSPITAL_COMMUNITY): Payer: Self-pay

## 2017-03-10 ENCOUNTER — Ambulatory Visit (HOSPITAL_COMMUNITY): Payer: Medicare HMO

## 2017-03-10 NOTE — Telephone Encounter (Signed)
I called to follow up on hospice initiation, talked with his wife who tells me patient is doing well. Mild pain, just tried oxycodone this morning with good benefit. Home hospice has been arranged, they had an initial visit yesterday and are coming back to the house today. No further questions, she was grateful for our clinic's care.

## 2017-03-14 ENCOUNTER — Ambulatory Visit: Payer: Medicare HMO | Admitting: Oncology

## 2017-03-15 NOTE — Telephone Encounter (Signed)
Pt seen 03/08/17 for HFU in [facility]-hospice referral was made at that time.Despina Hidden Cassady3/5/201910:48 AM

## 2017-03-29 ENCOUNTER — Ambulatory Visit (INDEPENDENT_AMBULATORY_CARE_PROVIDER_SITE_OTHER): Payer: Medicare Other | Admitting: *Deleted

## 2017-03-29 DIAGNOSIS — R001 Bradycardia, unspecified: Secondary | ICD-10-CM | POA: Diagnosis not present

## 2017-03-29 NOTE — Progress Notes (Signed)
Remote pacemaker transmission.   

## 2017-03-30 ENCOUNTER — Encounter: Payer: Self-pay | Admitting: Cardiology

## 2017-03-31 ENCOUNTER — Telehealth: Payer: Self-pay | Admitting: *Deleted

## 2017-03-31 NOTE — Telephone Encounter (Signed)
° ° °  1. Has your device fired? no  2. Is you device beeping? no  3. Are you experiencing draining or swelling at device site? no  4. Are you calling to see if we received your device transmission? no  5. Have you passed out? no  Patient wife [name]) calling,   States that patient was admitted to hospice care as he has colon cancer. [name] would like to know what to do with the monitor.     Please route to [place]

## 2017-04-01 LAB — CUP PACEART REMOTE DEVICE CHECK
Battery Remaining Longevity: 115 mo
Battery Voltage: 3.01 V
Brady Statistic AP VP Percent: 1 %
Brady Statistic RA Percent Paced: 39 %
Date Time Interrogation Session: 20190319060015
Implantable Lead Implant Date: 20170221
Implantable Lead Location: 753859
Implantable Pulse Generator Implant Date: 20170221
Lead Channel Impedance Value: 450 Ohm
Lead Channel Pacing Threshold Amplitude: 0.5 V
Lead Channel Pacing Threshold Amplitude: 1 V
Lead Channel Pacing Threshold Pulse Width: 0.5 ms
Lead Channel Sensing Intrinsic Amplitude: 10.3 mV
MDC IDC LEAD IMPLANT DT: 20170221
MDC IDC LEAD LOCATION: 753860
MDC IDC MSMT BATTERY REMAINING PERCENTAGE: 95.5 %
MDC IDC MSMT LEADCHNL RA SENSING INTR AMPL: 4.3 mV
MDC IDC MSMT LEADCHNL RV IMPEDANCE VALUE: 390 Ohm
MDC IDC MSMT LEADCHNL RV PACING THRESHOLD PULSEWIDTH: 0.5 ms
MDC IDC PG SERIAL: 7877392
MDC IDC SET LEADCHNL RA PACING AMPLITUDE: 2 V
MDC IDC SET LEADCHNL RV PACING AMPLITUDE: 2.5 V
MDC IDC SET LEADCHNL RV PACING PULSEWIDTH: 0.5 ms
MDC IDC SET LEADCHNL RV SENSING SENSITIVITY: 0.7 mV
MDC IDC STAT BRADY AP VS PERCENT: 40 %
MDC IDC STAT BRADY AS VP PERCENT: 1.8 %
MDC IDC STAT BRADY AS VS PERCENT: 58 %
MDC IDC STAT BRADY RV PERCENT PACED: 2.2 %
Pulse Gen Model: 2240

## 2017-04-04 ENCOUNTER — Telehealth: Payer: Self-pay | Admitting: *Deleted

## 2017-04-04 NOTE — Telephone Encounter (Signed)
[facility] called to notify that patient passed away Apr 17, 2022 (04/09/17) @ 5:52pm.

## 2017-04-05 NOTE — Telephone Encounter (Signed)
Message: [facility] called to notify that patient passed away 04/25/2022 (Apr 17, 2017) @ 5:52pm.   Reply: I am very sorry to hear that. Please offer my condolences to the family. He was a great patient.

## 2017-04-11 NOTE — Addendum Note (Signed)
Addended by: Orson Gear on: 04/11/2017 10:56 AM   Modules accepted: Orders

## 2017-04-11 NOTE — Telephone Encounter (Signed)
Spoke with patients wife and explained that she can leave his home monitor plugged in at this time. She verbalized understanding.

## 2017-04-11 DEATH — deceased

## 2017-05-20 ENCOUNTER — Encounter: Payer: Medicare HMO | Admitting: Internal Medicine

## 2018-05-11 IMAGING — CT CT HEAD W/O CM
3 of 4 series · 15 of 47 positions shown, 18 images · non-contrast
Comparison: 03/13/2016

CLINICAL DATA: Shortness of breath for few days. Confusion
increasing today. Lethargy.

EXAM:
CT HEAD WITHOUT CONTRAST
TECHNIQUE: Contiguous axial images were obtained from the base of the skull
through the vertex without intravenous contrast.

[Series 4: head 5.0 h30s · axial · 0.39mm/px · z∈[-89,+41]mm · 9 of 32 slices shown, 12 images]
[im 3/32  brain]
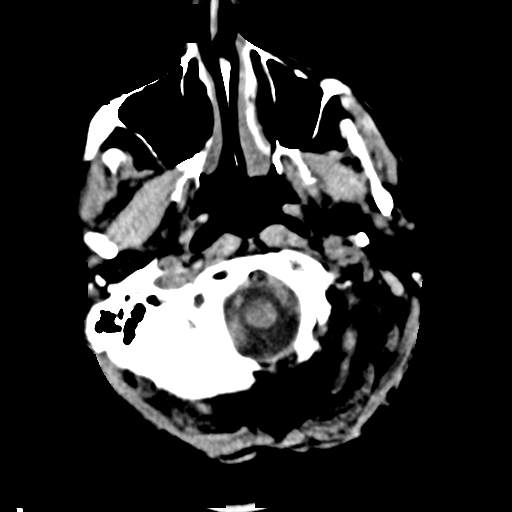
[im 3/32  bone]
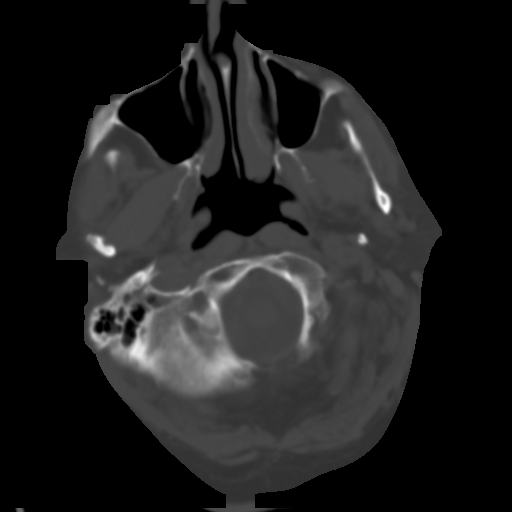
[im 7/32  brain]
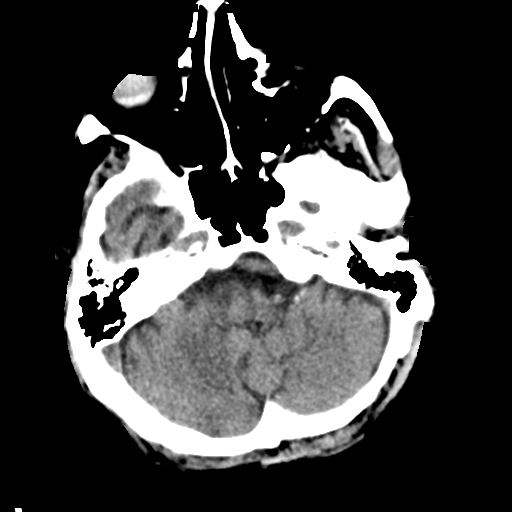
[im 9/32  brain]
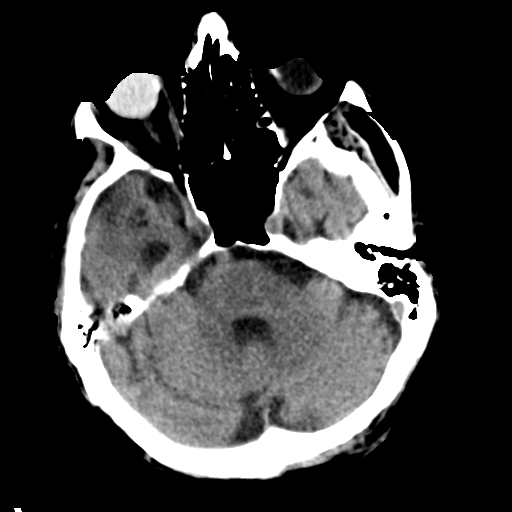
[im 13/32  brain]
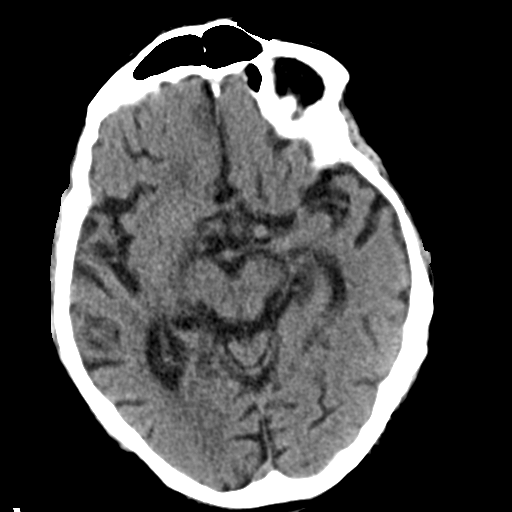
[im 17/32  brain]
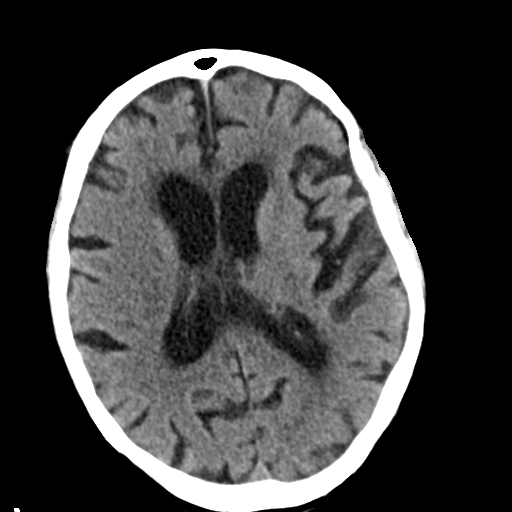
[im 17/32  bone]
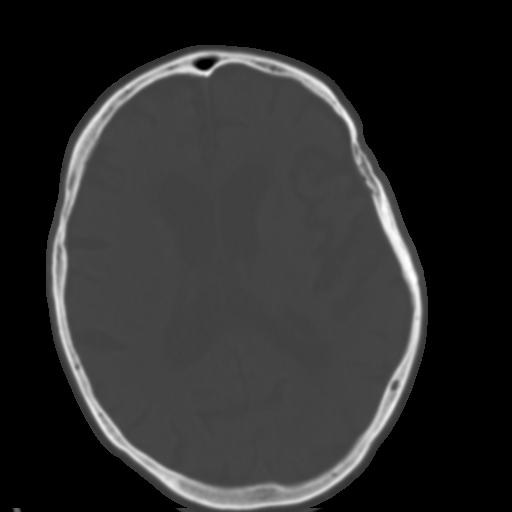
[im 19/32  brain]
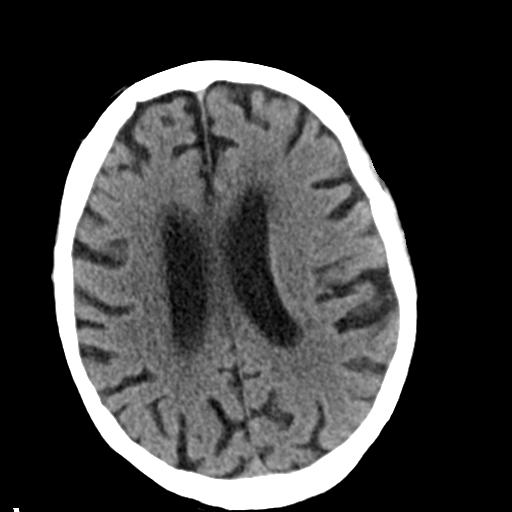
[im 23/32  brain]
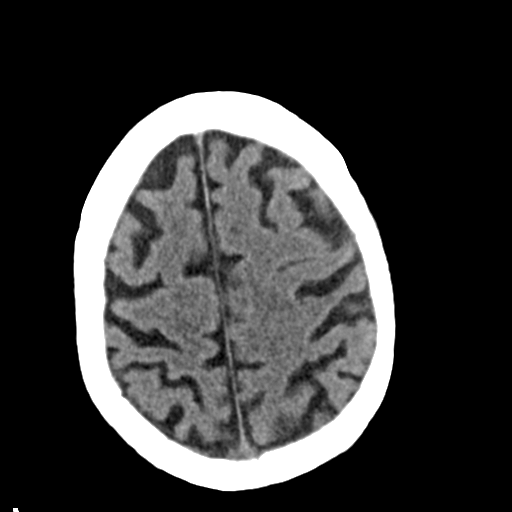
[im 25/32  brain]
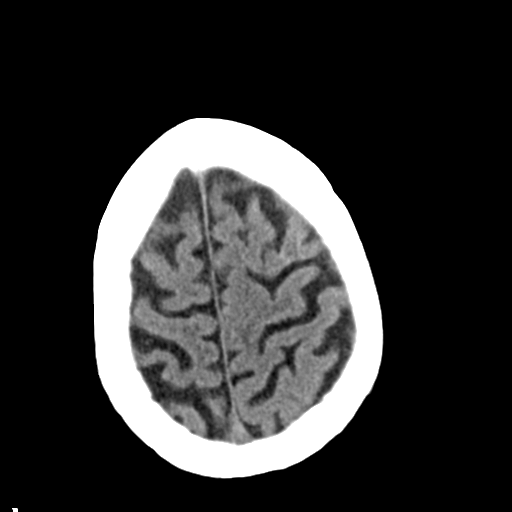
[im 29/32  brain]
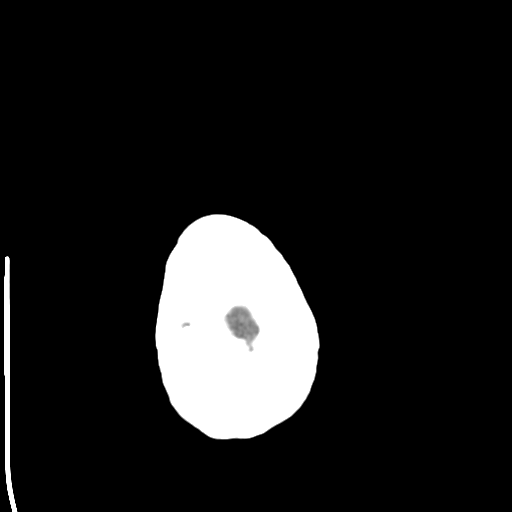
[im 29/32  bone]
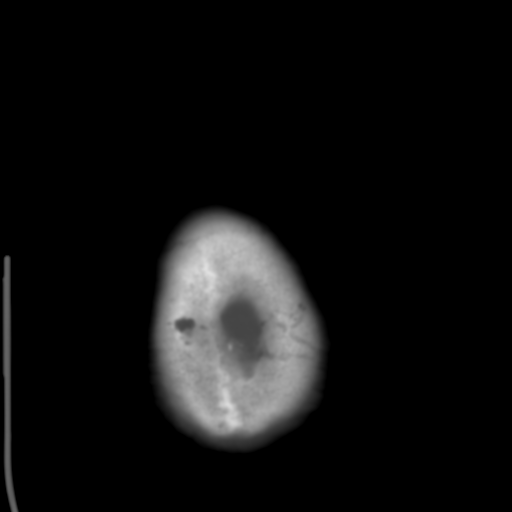

[Series 5: head 3.0 mpr cor · coronal · 0.31mm/px · 3 of 67 slices shown]
[im 23/67  brain]
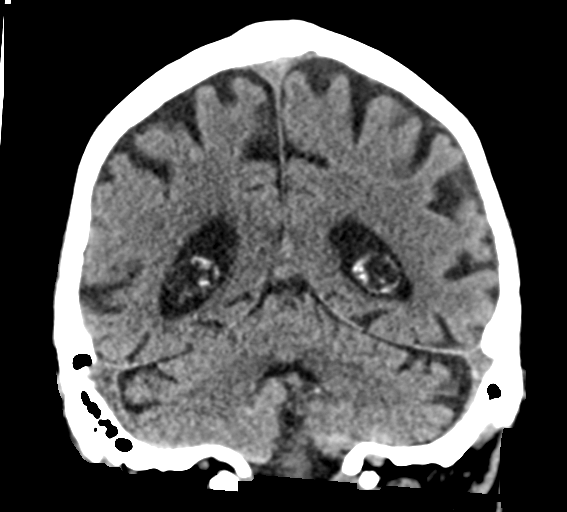
[im 30/67  brain]
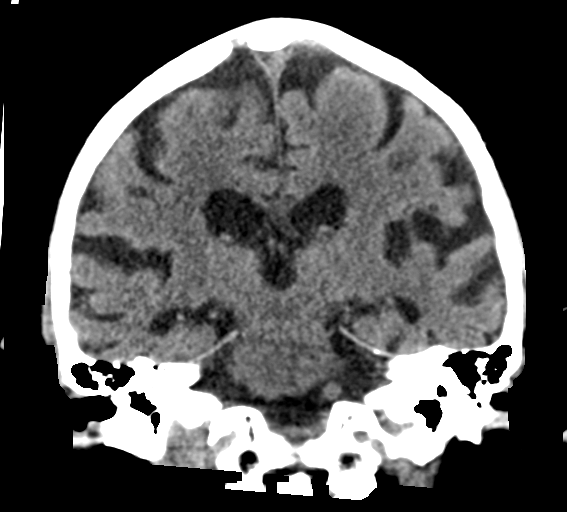
[im 37/67  brain]
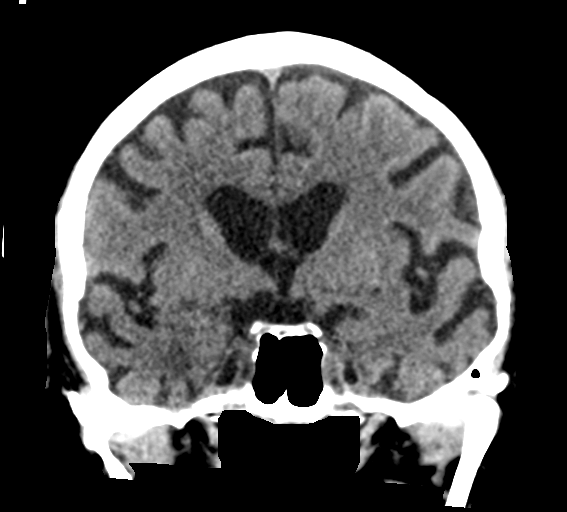

[Series 6: head 3.0 mpr sag · sagittal · 0.31mm/px · 3 of 54 slices shown]
[im 18/54  brain]
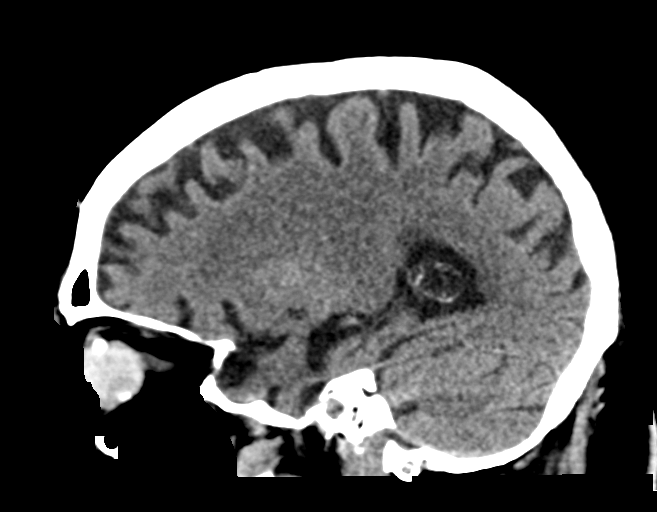
[im 27/54  brain]
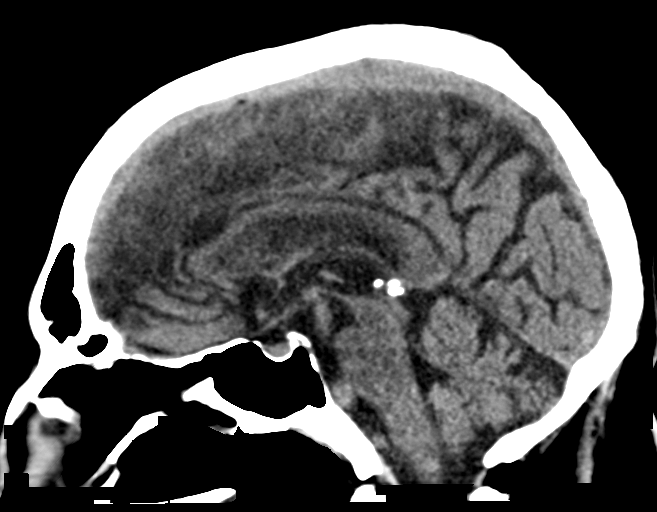
[im 36/54  brain]
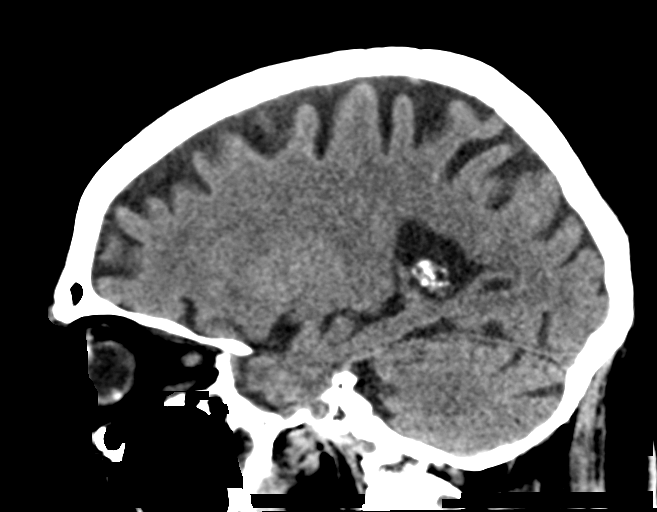

[15 of 47 positions shown; findings below may reference images not displayed]

FINDINGS: Brain: Diffuse cerebral atrophy. Mild ventricular dilatation
consistent with central atrophy. Low-attenuation changes in the deep
white matter consistent with small vessel ischemia. No evidence of
acute infarction, hemorrhage, hydrocephalus, extra-axial collection
or mass lesion/mass effect.

Vascular: Mild internal carotid artery vascular calcifications.

Skull: Calvarium appears intact.

Sinuses/Orbits: Paranasal sinuses and mastoid air cells are clear.
Diffusely increased density in the right globe likely postoperative.

Other: None.
IMPRESSION: No acute intracranial abnormalities. Chronic atrophy and small
vessel ischemic changes.

## 2023-08-09 NOTE — Progress Notes (Signed)
 This encounter was created in error - please disregard.
